# Patient Record
Sex: Female | Born: 1995
Health system: Southern US, Community
[De-identification: ages and names within clinical notes are randomized; demographics above are authoritative.]

## PROBLEM LIST (undated history)

## (undated) DIAGNOSIS — D690 Allergic purpura: Secondary | ICD-10-CM

## (undated) DIAGNOSIS — G43909 Migraine, unspecified, not intractable, without status migrainosus: Secondary | ICD-10-CM

## (undated) DIAGNOSIS — K5900 Constipation, unspecified: Secondary | ICD-10-CM

## (undated) DIAGNOSIS — E079 Disorder of thyroid, unspecified: Secondary | ICD-10-CM

## (undated) DIAGNOSIS — I10 Essential (primary) hypertension: Secondary | ICD-10-CM

## (undated) DIAGNOSIS — O24419 Gestational diabetes mellitus in pregnancy, unspecified control: Secondary | ICD-10-CM

## (undated) HISTORY — DX: Essential (primary) hypertension: I10

## (undated) HISTORY — DX: Gestational diabetes mellitus in pregnancy, unspecified control: O24.419

## (undated) HISTORY — PX: ESOPHAGOGASTRODUODENOSCOPY: SHX1529

## (undated) HISTORY — PX: WISDOM TOOTH EXTRACTION: SHX21

---
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
## 2016-02-16 ENCOUNTER — Emergency Department (HOSPITAL_COMMUNITY): Payer: Medicaid Other

## 2016-02-16 ENCOUNTER — Emergency Department (HOSPITAL_COMMUNITY)
Admission: EM | Admit: 2016-02-16 | Discharge: 2016-02-16 | Disposition: A | Discharge status: Home / Self Care | Payer: Medicaid Other | Attending: Emergency Medicine | Admitting: Emergency Medicine

## 2016-02-16 ENCOUNTER — Encounter (HOSPITAL_COMMUNITY): Payer: Self-pay | Admitting: Family Medicine

## 2016-02-16 DIAGNOSIS — Z8639 Personal history of other endocrine, nutritional and metabolic disease: Secondary | ICD-10-CM | POA: Insufficient documentation

## 2016-02-16 DIAGNOSIS — Z880 Allergy status to penicillin: Secondary | ICD-10-CM | POA: Diagnosis not present

## 2016-02-16 DIAGNOSIS — Z8719 Personal history of other diseases of the digestive system: Secondary | ICD-10-CM | POA: Diagnosis not present

## 2016-02-16 DIAGNOSIS — Z8679 Personal history of other diseases of the circulatory system: Secondary | ICD-10-CM | POA: Insufficient documentation

## 2016-02-16 DIAGNOSIS — J40 Bronchitis, not specified as acute or chronic: Secondary | ICD-10-CM | POA: Insufficient documentation

## 2016-02-16 DIAGNOSIS — B349 Viral infection, unspecified: Secondary | ICD-10-CM | POA: Diagnosis not present

## 2016-02-16 DIAGNOSIS — Z3202 Encounter for pregnancy test, result negative: Secondary | ICD-10-CM | POA: Diagnosis not present

## 2016-02-16 DIAGNOSIS — R109 Unspecified abdominal pain: Secondary | ICD-10-CM | POA: Diagnosis not present

## 2016-02-16 DIAGNOSIS — Z791 Long term (current) use of non-steroidal anti-inflammatories (NSAID): Secondary | ICD-10-CM | POA: Diagnosis not present

## 2016-02-16 DIAGNOSIS — R51 Headache: Secondary | ICD-10-CM | POA: Diagnosis present

## 2016-02-16 LAB — COMPREHENSIVE METABOLIC PANEL
ALK PHOS: 75 U/L (ref 38–126)
ALT: 44 U/L (ref 14–54)
AST: 34 U/L (ref 15–41)
Albumin: 3.7 g/dL (ref 3.5–5.0)
Anion gap: 9 (ref 5–15)
BILIRUBIN TOTAL: 0.4 mg/dL (ref 0.3–1.2)
BUN: 7 mg/dL (ref 6–20)
CALCIUM: 9.6 mg/dL (ref 8.9–10.3)
CHLORIDE: 107 mmol/L (ref 101–111)
CO2: 24 mmol/L (ref 22–32)
CREATININE: 0.58 mg/dL (ref 0.44–1.00)
Glucose, Bld: 94 mg/dL (ref 65–99)
Potassium: 4 mmol/L (ref 3.5–5.1)
Sodium: 140 mmol/L (ref 135–145)
Total Protein: 7.2 g/dL (ref 6.5–8.1)

## 2016-02-16 LAB — I-STAT BETA HCG BLOOD, ED (MC, WL, AP ONLY)

## 2016-02-16 LAB — CBC
HCT: 36.1 % (ref 36.0–46.0)
Hemoglobin: 12.7 g/dL (ref 12.0–15.0)
MCH: 31.8 pg (ref 26.0–34.0)
MCHC: 35.2 g/dL (ref 30.0–36.0)
MCV: 90.3 fL (ref 78.0–100.0)
PLATELETS: 298 10*3/uL (ref 150–400)
RBC: 4 MIL/uL (ref 3.87–5.11)
RDW: 12.3 % (ref 11.5–15.5)
WBC: 6.9 10*3/uL (ref 4.0–10.5)

## 2016-02-16 LAB — URINALYSIS, ROUTINE W REFLEX MICROSCOPIC
Bilirubin Urine: NEGATIVE
GLUCOSE, UA: NEGATIVE mg/dL
HGB URINE DIPSTICK: NEGATIVE
KETONES UR: NEGATIVE mg/dL
LEUKOCYTES UA: NEGATIVE
Nitrite: NEGATIVE
PROTEIN: NEGATIVE mg/dL
Specific Gravity, Urine: 1.022 (ref 1.005–1.030)
pH: 7 (ref 5.0–8.0)

## 2016-02-16 LAB — LIPASE, BLOOD: LIPASE: 30 U/L (ref 11–51)

## 2016-02-16 MED ORDER — PREDNISONE 20 MG PO TABS
60.0000 mg | ORAL_TABLET | Freq: Once | ORAL | Status: AC
  Administered 2016-02-16: 60 mg via ORAL
  Filled 2016-02-16: qty 3

## 2016-02-16 MED ORDER — IPRATROPIUM BROMIDE 0.02 % IN SOLN
0.5000 mg | Freq: Once | RESPIRATORY_TRACT | Status: AC
  Administered 2016-02-16: 0.5 mg via RESPIRATORY_TRACT
  Filled 2016-02-16 (×2): qty 2.5

## 2016-02-16 MED ORDER — ALBUTEROL SULFATE HFA 108 (90 BASE) MCG/ACT IN AERS
1.0000 | INHALATION_SPRAY | RESPIRATORY_TRACT | Status: DC | PRN
  Administered 2016-02-16: 2 via RESPIRATORY_TRACT
  Filled 2016-02-16: qty 6.7

## 2016-02-16 MED ORDER — PREDNISONE 20 MG PO TABS: 40.0000 mg | ORAL_TABLET | Freq: Every day | ORAL | Status: DC

## 2016-02-16 MED ORDER — ALBUTEROL SULFATE (2.5 MG/3ML) 0.083% IN NEBU
5.0000 mg | INHALATION_SOLUTION | Freq: Once | RESPIRATORY_TRACT | Status: AC
  Administered 2016-02-16: 5 mg via RESPIRATORY_TRACT
  Filled 2016-02-16: qty 6

## 2016-02-16 NOTE — ED Notes (Signed)
Pt here with multiple complains. sts headache and she thinks her BP is up. sts also abd pain and weakness. sts seeing spots in her eyes. sts she never checked her BP just thought it was up. BP 116/73 in triage. sts nausea in the morning.

## 2016-02-16 NOTE — ED Provider Notes (Signed)
CSN: [id number]     Arrival date & time 02/16/16  0844 History   First MD Initiated Contact with Patient 02/16/16 [phone]     Chief Complaint  Patient presents with  . Abdominal Pain  . Hypertension  . Headache     (Consider location/radiation/quality/duration/timing/severity/associated sxs/prior Treatment) HPI Comments: Pt is an 20 y/o female with hx of thyroid disease and constipation presenting with 2 week hx of just not feeling well.  She states that she has morning and evening nausea without vomiting.  Her migraine headaches have been worse than normal and she occasionally will see spots in her urine.  Her abdomen has felt uneasy but denies any true pain.  No diarrhea and BM's are always irregular and she denies any changes.  No vaginal bleeding or d/c.  She can't remember when her last menses was.  Also over the last month she has had a persistent non-productive cough.  She denies fever or SOB.  She will still having wheezing at night and morning.  She is using her inhaler but was given allergy meds and prednisone at the beginning of the month which did help.  No prior hx of asthma and pt denies tobacco use.  Patient is a 20 y.o. female presenting with abdominal pain, hypertension, and headaches. The history is provided by the patient.  Abdominal Pain Hypertension Associated symptoms include abdominal pain and headaches.  Headache Associated symptoms: abdominal pain     Past Medical History  Diagnosis Date  . Constipation   . Thyroid disease   . Migraine    History reviewed. No pertinent past surgical history. History reviewed. No pertinent family history. Social History  Substance Use Topics  . Smoking status: Never Smoker   . Smokeless tobacco: Never Used  . Alcohol Use: No   OB History    No data available     Review of Systems  Gastrointestinal: Positive for abdominal pain.  Neurological: Positive for headaches.  All other systems reviewed and are  negative.     Allergies  Peanut-containing drug products and Penicillins  Home Medications   Prior to Admission medications   Medication Sig Start Date End Date Taking? Authorizing Provider  acetaminophen (TYLENOL) 325 MG tablet Take 650 mg by mouth every 6 (six) hours as needed for mild pain.    Historical Provider, MD  diphenhydrAMINE (SOMINEX) 25 MG tablet Take 25 mg by mouth at bedtime as needed for sleep.    Historical Provider, MD  levothyroxine (SYNTHROID, LEVOTHROID) 25 MCG tablet Take 1 tablet (25 mcg total) by mouth daily before breakfast. Patient not taking: Reported on 09/14/2015 04/24/15   Saverio DankerSarah E Stephens, MD  naproxen (NAPROSYN) 500 MG tablet Take 1 tablet (500 mg total) by mouth 2 (two) times daily. 11/09/15   Tatyana Kirichenko, PA-C  ondansetron (ZOFRAN ODT) 4 MG disintegrating tablet Take 1 tablet (4 mg total) by mouth every 8 (eight) hours as needed for nausea or vomiting. Patient not taking: Reported on 11/01/2015 09/14/15   Deirdre PeerJeremiah Gaddy, MD  polyethylene glycol (GOLYTELY) 236 G solution Take 4,000 mLs by mouth once. 11/01/15   Tatyana Kirichenko, PA-C  polyethylene glycol powder (GLYCOLAX/MIRALAX) powder Take 17 g by mouth 2 (two) times daily. Patient not taking: Reported on 11/01/2015 04/24/15   Saverio DankerSarah E Stephens, MD  tiZANidine (ZANAFLEX) 4 MG capsule Take 1 capsule (4 mg total) by mouth 3 (three) times daily as needed (headache). Patient not taking: Reported on 11/01/2015 08/17/15   Francee PiccoloJennifer Piepenbrink, PA-C  BP 109/52 mmHg  Pulse 75  Temp(Src) 98.3 F (36.8 C) (Oral)  Resp 18  SpO2 99%  LMP 01/05/2016 Physical Exam  Constitutional: She is oriented to person, place, and time. She appears well-developed and well-nourished. No distress.  HENT:  Head: Normocephalic and atraumatic.  Mouth/Throat: Oropharynx is clear and moist.  Eyes: Conjunctivae and EOM are normal. Pupils are equal, round, and reactive to light.  Neck: Normal range of motion. Neck supple.   Cardiovascular: Normal rate, regular rhythm and intact distal pulses.   No murmur heard. Pulmonary/Chest: Effort normal. No respiratory distress. She has wheezes. She has no rales.  Scant wheezes  Abdominal: Soft. She exhibits no distension. There is no tenderness. There is no rebound and no guarding.  Musculoskeletal: Normal range of motion. She exhibits no edema or tenderness.  Neurological: She is alert and oriented to person, place, and time.  Skin: Skin is warm and dry. No rash noted. No erythema.  Psychiatric: She has a normal mood and affect. Her behavior is normal.  Nursing note and vitals reviewed.   ED Course  Procedures (including critical care time) Labs Review Labs Reviewed  LIPASE, BLOOD  COMPREHENSIVE METABOLIC PANEL  CBC  URINALYSIS, ROUTINE W REFLEX MICROSCOPIC (NOT AT [facility])  I-STAT BETA HCG BLOOD, ED ([facility], [facility], [facility] ONLY)    Imaging Review Dg Chest 2 View  02/16/2016  CLINICAL DATA:  20 year old female with a history of shortness of breath episodic for 4 weeks. EXAM: CHEST - 2 VIEW COMPARISON:  None. FINDINGS: Cardiothymic silhouette within normal limits in size and contour. Lung volumes adequate. No confluent airspace disease pleural effusion, or pneumothorax. Mild central airway thickening. No displaced fracture. Unremarkable appearance of the upper abdomen. IMPRESSION: Nonspecific central airway thickening may reflect reactive airway disease or potentially viral infection. No confluent airspace disease to suggest pneumonia. Signed, Yvone Neu. Loreta Ave, DO [facility] Radiology Electronically Signed   By: Gilmer Mor D.O.   On: 02/16/2016 11:26   I have personally reviewed and evaluated these images and lab results as part of my medical decision-making.   EKG Interpretation None      MDM   Final diagnoses:  Bronchitis  Viral syndrome    Pt presenting with c/o of malaise over the last 2 weeks with morning and  evening nausea without vomiting.  She states she occasionally will get spots in her vision but has noted her migraines are worse that normal.  Pt thought maybe her BP was up but never checked it.  Pt also about 1 month ago dx with bronchitis and has had persistent wheezing and cough.  She did take prednisone and allergy meds but those have run out and cough has persisted.  Denies any vaginal or urinary complaints but pt is unsure when her last menses was.  Concern for pregnancy as the source of pt's sx vs chronic bronchitis.  Mild wheezing on exam today but VS wnl and no distress.  Pt does not appear to be dehydrated.  No localized abd pain.  No focal neurologic findings present.  CBC, CMP, lipase, UA, HCG pending.  Pt given albuterol/atrovent.  Will get CXR.  11:42 AM Labs are all wnl.  CXR with peribronchial thickening consistent with viral etiology.  Pt treated for bronchitis with steroids and she was given an inhaler.  No abx at this time.  Gwyneth Sprout, MD 02/16/16 1143

## 2016-03-02 ENCOUNTER — Encounter: Payer: Self-pay | Admitting: Neurology

## 2016-03-02 ENCOUNTER — Ambulatory Visit (INDEPENDENT_AMBULATORY_CARE_PROVIDER_SITE_OTHER): Payer: Medicaid Other | Admitting: Neurology

## 2016-03-02 VITALS — BP 106/71 | HR 81 | Resp 18 | Ht 63.0 in | Wt 207.0 lb

## 2016-03-02 DIAGNOSIS — R351 Nocturia: Secondary | ICD-10-CM | POA: Diagnosis not present

## 2016-03-02 DIAGNOSIS — E669 Obesity, unspecified: Secondary | ICD-10-CM

## 2016-03-02 DIAGNOSIS — R0683 Snoring: Secondary | ICD-10-CM | POA: Diagnosis not present

## 2016-03-02 DIAGNOSIS — R0681 Apnea, not elsewhere classified: Secondary | ICD-10-CM

## 2016-03-02 DIAGNOSIS — R519 Headache, unspecified: Secondary | ICD-10-CM

## 2016-03-02 DIAGNOSIS — Z789 Other specified health status: Secondary | ICD-10-CM

## 2016-03-02 DIAGNOSIS — R51 Headache: Secondary | ICD-10-CM

## 2016-03-02 DIAGNOSIS — G4719 Other hypersomnia: Secondary | ICD-10-CM | POA: Diagnosis not present

## 2016-03-02 DIAGNOSIS — Z9189 Other specified personal risk factors, not elsewhere classified: Secondary | ICD-10-CM

## 2016-03-02 DIAGNOSIS — G43011 Migraine without aura, intractable, with status migrainosus: Secondary | ICD-10-CM

## 2016-03-02 MED ORDER — NORTRIPTYLINE HCL 10 MG PO CAPS: ORAL_CAPSULE | ORAL | Status: DC

## 2016-03-02 NOTE — Progress Notes (Signed)
Subjective:    Patient ID: Nichole Bush is a 20 y.o. female.  HPI     Huston FoleySaima Kwame Ryland, MD, PhD [facility] [address], [address] [address] [place], [place] [address]  Dear Dr. Rolena InfanteStansfield,   I saw your patient, Nichole Bush, upon your kind request in my neurologic clinic today for initial consultation of her recurrent headaches, concern for migraines. The patient is unaccompanied today. As you know, Nichole Bush is an 20 year old right-handed woman with an underlying medical history of asthma, allergies, and obesity, who reports a longer standing history of migraine headaches. She started having headaches when she was 20 years old. She was getting a headache about 3 times a week but in the last 4 weeks she has had a daily headache. She reports a left-sided pounding headache typically, associated with nausea, occasional vomiting, and photophobia, typically no aura described but occasional blurry vision reported. She has a family history of migraines in her father. Her parents are separated. She lived with [place] time in [place]. He still lives there, mother lives in [place]. She has 3 siblings on father's side, 4 siblings on mother's side. She has gained weight. She snores, at times loudly. Boyfriend has noted witnessed apneic pauses while she is asleep. Sleep is disrupted, she has nocturia about twice a night. She often wakes up with a headache. She drinks a lot of caffeine in the form of soda, usually at 2 L bottle of Pepsi per day and iced coffee, one large per day, she does not remember the last time she drank water. She lives alone. She has been living alone for the past 3 months. She works at [employer] so. She is a nonsmoker denies using any illicit drugs. She does not drink alcohol. Bedtime is around 11 PM, wakeup time is around 6 AM as she takes her boyfriend to work. She denies any one-sided weakness or numbness. She has a large hairdo, but says, that she  only recently had her hairstyle changed and it did not affect her headaches. I reviewed your office note from 02/25/2016, which you kindly included. She was given a prescription for Phenergan for nausea, and Imitrex 25 mg strength 1-2 pills as needed for migraine. She has tried the medications but feels she is too sleepy from them. She has tried over-the-counter medications including Excedrin Migraine and Tylenol PM, with no significant improvement.  She does not report being on prescription medication as a child for migraines. There is no family history of long QT, heart disease, and she has no personal or family history of lupus, or sickle cell trait or sickle cell disease.  Her Past Medical History Is Significant For: Past Medical History  Diagnosis Date  . Constipation   . Thyroid disease   . Migraine   . Hypertension     Her Past Surgical History Is Significant For: No past surgical history on file.  Her Family History Is Significant For: Family History  Problem Relation Age of Onset  . Migraines Father   . Hypertension Father     Her Social History Is Significant For: Social History   Social History  . Marital Status: Single    Spouse Name: N/A  . Number of Children: N/A  . Years of Education: HS   Occupational History  . [employer]    Social History Main Topics  . Smoking status: Never Smoker   . Smokeless tobacco: Never Used  . Alcohol Use: No  .  Drug Use: No  . Sexual Activity: Not Asked   Other Topics Concern  . None   Social History Narrative   States that she drinks "a lot" of soda daily     Her Allergies Are:  Allergies  Allergen Reactions  . Peanut-Containing Drug Products Anaphylaxis  . Penicillins Anaphylaxis, Hives and Other (See Comments)    Has patient had a PCN reaction causing immediate rash, facial/tongue/throat swelling, SOB or lightheadedness with hypotension: Yes Has patient had a PCN reaction causing severe rash involving mucus membranes  or skin necrosis: No Has patient had a PCN reaction that required hospitalization No Has patient had a PCN reaction occurring within the last 10 years: No If all of the above answers are "NO", then may proceed with Cephalosporin use.   :   Her Current Medications Are:  Outpatient Encounter Prescriptions as of 03/02/2016  Medication Sig  . albuterol (PROVENTIL HFA;VENTOLIN HFA) 108 (90 Base) MCG/ACT inhaler Inhale 2 puffs into the lungs every 6 (six) hours as needed for wheezing or shortness of breath.  . diphenhydramine-acetaminophen (TYLENOL PM) 25-500 MG TABS tablet Take 1 tablet by mouth at bedtime.  . ondansetron (ZOFRAN ODT) 4 MG disintegrating tablet Take 1 tablet (4 mg total) by mouth every 8 (eight) hours as needed for nausea or vomiting.  . polyethylene glycol powder (GLYCOLAX/MIRALAX) powder Take 17 g by mouth 2 (two) times daily. (Patient taking differently: Take 17 g by mouth daily as needed for mild constipation. )  . predniSONE (DELTASONE) 20 MG tablet Take 2 tablets (40 mg total) by mouth daily.  . promethazine (PHENERGAN) 25 MG tablet TK 1 T PO Q 6 H  . SUMAtriptan (IMITREX) 25 MG tablet    No facility-administered encounter medications on file as of 03/02/2016.  :   Review of Systems:  Out of a complete 14 point review of systems, all are reviewed and negative with the exception of these symptoms as listed below:   Review of Systems  Neurological:       Patient reports that she has had migraines for a couple of years. Recently have become worse. Patient states that she has had current migraines for 4 weeks.  Patient reports that she lived in a house for a year that had mold and mildew, moved out Feb 2017    Objective:  Neurologic Exam  Physical Exam Physical Examination:   Filed Vitals:   03/02/16 1104  BP: 106/71  Pulse: 81  Resp: 18    General Examination: The patient is a very pleasant 20 y.o. female in no acute distress. She appears well-developed and  well-nourished and adequately groomed.   HEENT: Normocephalic, atraumatic, pupils are equal, round and reactive to light and accommodation. Funduscopic exam is normal with sharp disc margins noted. Extraocular tracking is good without limitation to gaze excursion or nystagmus noted. Normal smooth pursuit is noted. Hearing is grossly intact. Tympanic membranes are clear bilaterally. Face is symmetric with normal facial animation and normal facial sensation. Speech is clear with no dysarthria noted. There is no hypophonia. There is no lip, neck/head, jaw or voice tremor. Neck is supple with full range of passive and active motion. There are no carotid bruits on auscultation. Oropharynx exam reveals: mild mouth dryness, adequate dental hygiene and moderate airway crowding, due to smaller airway entry, tonsils of 2-3+ bilaterally and thicker soft palate. Mallampati is class II. Tongue protrudes centrally and palate elevates symmetrically. Neck size is 15 1/8 inches. She has a Mild overbite.  Chest: Clear to auscultation without wheezing, rhonchi or crackles noted.  Heart: S1+S2+0, regular and normal without murmurs, rubs or gallops noted.   Abdomen: Soft, non-tender and non-distended with normal bowel sounds appreciated on auscultation.  Extremities: There is no pitting edema in the distal lower extremities bilaterally. Pedal pulses are intact.  Skin: Warm and dry without trophic changes noted. There are no varicose veins.  Musculoskeletal: exam reveals no obvious joint deformities, tenderness or joint swelling or erythema.   Neurologically:  Mental status: The patient is awake, alert and oriented in all 4 spheres. Her immediate and remote memory, attention, language skills and fund of knowledge are appropriate. There is no evidence of aphasia, agnosia, apraxia or anomia. Speech is clear with normal prosody and enunciation. Thought process is linear. Mood is normal and affect is normal.  Cranial  nerves II - XII are as described above under HEENT exam. In addition: shoulder shrug is normal with equal shoulder height noted. Motor exam: Normal bulk, strength and tone is noted. There is no drift, tremor or rebound. Romberg is negative. Reflexes are 2+ throughout. Babinski: Toes are flexor bilaterally. Fine motor skills and coordination: intact with normal finger taps, normal hand movements, normal rapid alternating patting, normal foot taps and normal foot agility.  Cerebellar testing: No dysmetria or intention tremor on finger to nose testing. Heel to shin is unremarkable bilaterally. There is no truncal or gait ataxia.  Sensory exam: intact to light touch, pinprick, vibration, temperature sense in the upper and lower extremities.  Gait, station and balance: She stands easily. No veering to one side is noted. No leaning to one side is noted. Posture is age-appropriate and stance is narrow based. Gait shows normal stride length and normal pace. No problems turning are noted. Tandem walk is unremarkable.   Assessment and Plan:   In summary, Jenel Gierke is a very pleasant 20 y.o.-year old female with an underlying medical history of asthma, allergies, and obesity, who presents with a history of migrainous headaches for at least 6 or 7 years duration, worse in the past 4 weeks with nearly daily headaches. Her history and physical exam are in keeping with migraine without aura, intractable with status at this time. She has in addition history and physical exam concerning for underlying obstructive sleep apnea. She is overusing caffeine which can also be a contributor, in addition to sleep deprivation and sleep disordered breathing which can all be contributors as well.  I had a long chat with the patient about my findings and the diagnosis of migraine headaches, obstructive sleep apnea, the prognosis and treatment options. We talked about medical treatments and non-pharmacological approaches. We talked  about maintaining a healthy lifestyle in general. I encouraged the patient to eat healthy, exercise daily and keep well hydrated, to keep a scheduled bedtime and wake time routine, to not skip any meals and eat healthy snacks in between meals and to have protein with every meal.  She is strongly advised to gradually reduce her caffeine intake and limit herself to 1-2 servings per day, avoid after 5 PM. She is strongly advised to increase her water intake and stay better hydrated with water. We talked about sleep hygiene. I advised the patient about common headache triggers: sleep deprivation, dehydration, overheating, stress, hypoglycemia or skipping meals and blood sugar fluctuations, excessive pain medications or excessive alcohol use or caffeine withdrawal. Some people have food triggers such as aged cheese, orange juice or chocolate, especially dark chocolate, or MSG (monosodium glutamate).  She is to try to avoid these headache triggers as much possible. It may be helpful to keep a headache diary to figure out what makes Her headaches worse or brings them on and what alleviates them. Some people report headache onset after exercise but studies have shown that regular exercise may actually prevent headaches from coming. If She has exercise-induced headaches, She is advised to drink plenty of fluid before and after exercising and that to not overdo it and to not overheat.  She has a nonfocal neurological exam, she is reassured in that regard. I talked to her about sleep study testing and she is willing to return for a sleep study and consider treatment for obstructive sleep apnea the need arises. I talked to her about sleep study testing as well. I do not think we need any imaging studies from my end of things at this time. She can continue abortive treatment for migraines with as needed Imitrex generic. She can use Phenergan for nausea but is advised that it can be sedating. For headache prevention I suggested  a trial of nortriptyline, starting with 10 mg each night for a week then increase to 20 mg each night thereafter. I talked to her about this medication, and side effects and the titration. She was given written instructions.   I will see her back routinely in 3 months, sooner if needed. We will be in touch regarding her sleep study results over the phone as well. I answered all her questions today and the patient was in agreement with the plan.   Thank you very much for allowing me to participate in the care of this nice patient. If I can be of any further assistance to you please do not hesitate to call me at [phone].  Sincerely,   Huston Foley, MD, PhD

## 2016-03-02 NOTE — Patient Instructions (Addendum)
Please remember, common headache triggers are: sleep deprivation, dehydration, overheating, stress, hypoglycemia or skipping meals and blood sugar fluctuations, excessive pain medications or excessive alcohol use or caffeine withdrawal. Some people have food triggers such as aged cheese, orange juice or chocolate, especially dark chocolate, or MSG (monosodium glutamate). Try to avoid these headache triggers as much possible. It may be helpful to keep a headache diary to figure out what makes your headaches worse or brings them on and what alleviates them. Some people report headache onset after exercise but studies have shown that regular exercise may actually prevent headaches from coming. If you have exercise-induced headaches, please make sure that you drink plenty of fluid before and after exercising and that you do not over do it and do not overheat.  Based on your symptoms and your exam I believe you are at risk for obstructive sleep apnea or OSA, and I think we should proceed with a sleep study to determine whether you do or do not have OSA and how severe it is. If you have more than mild OSA, I want you to consider treatment with CPAP. Please remember, the risks and ramifications of moderate to severe obstructive sleep apnea or OSA are: Cardiovascular disease, including congestive heart failure, stroke, difficult to control hypertension, arrhythmias, and even type 2 diabetes has been linked to untreated OSA. Sleep apnea causes disruption of sleep and sleep deprivation in most cases, which, in turn, can cause recurrent headaches, problems with memory, mood, concentration, focus, and vigilance. Most people with untreated sleep apnea report excessive daytime sleepiness, which can affect their ability to drive. Please do not drive if you feel sleepy.   I will likely see you back after your sleep study to go over the test results and where to go from there. We will call you after your sleep study to advise  about the results (most likely, you will hear from Nichole Bush, my nurse) and to set up an appointment at the time, as necessary.    Our sleep lab administrative assistant, Alvis LemmingsDawn will meet with you or call you to schedule your sleep study. If you don't hear back from her by next week please feel free to call her at [phone]. This is her direct line and please leave a message with your phone number to call back if you get the voicemail box. She will call back as soon as possible.   You have to drink more water! Reduce your caffeine intake gradually and try to limit caffeine to 1 to 2 servings per day, avoid caffeine after 5 PM. Try to work on weight loss.   For headache prevention, we will start Pamelor (generic name: nortriptyline), 10 mg: Take 1 pill daily at bedtime for one week, then 2 pills daily at bedtime thereafter. Common side effects reported are: mouth dryness, drowsiness, confusion, dizziness.   You can use phenergan for nausea as needed and Imitrex for migraine acute treatment as needed.

## 2016-03-06 ENCOUNTER — Ambulatory Visit: Payer: Self-pay | Admitting: Neurology

## 2016-03-13 ENCOUNTER — Emergency Department (HOSPITAL_COMMUNITY)
Admission: EM | Admit: 2016-03-13 | Discharge: 2016-03-13 | Disposition: A | Discharge status: Home / Self Care | Payer: Medicaid Other | Attending: Emergency Medicine | Admitting: Emergency Medicine

## 2016-03-13 ENCOUNTER — Encounter (HOSPITAL_COMMUNITY): Payer: Self-pay | Admitting: *Deleted

## 2016-03-13 DIAGNOSIS — R0981 Nasal congestion: Secondary | ICD-10-CM | POA: Diagnosis not present

## 2016-03-13 DIAGNOSIS — I10 Essential (primary) hypertension: Secondary | ICD-10-CM | POA: Insufficient documentation

## 2016-03-13 DIAGNOSIS — R05 Cough: Secondary | ICD-10-CM | POA: Insufficient documentation

## 2016-03-13 DIAGNOSIS — R062 Wheezing: Secondary | ICD-10-CM | POA: Insufficient documentation

## 2016-03-13 MED ORDER — ALBUTEROL SULFATE (2.5 MG/3ML) 0.083% IN NEBU
INHALATION_SOLUTION | RESPIRATORY_TRACT | Status: AC
  Filled 2016-03-13: qty 6

## 2016-03-13 MED ORDER — ALBUTEROL SULFATE (2.5 MG/3ML) 0.083% IN NEBU
5.0000 mg | INHALATION_SOLUTION | Freq: Once | RESPIRATORY_TRACT | Status: AC
  Administered 2016-03-13: 5 mg via RESPIRATORY_TRACT

## 2016-03-13 NOTE — ED Notes (Signed)
Pt not wanting to stay. Will follow up with clinic tomorrow

## 2016-03-13 NOTE — ED Notes (Signed)
Pt refused chest XR.

## 2016-03-13 NOTE — ED Notes (Signed)
Pt in c/o cough and congestion for the last few days, increased wheezing, dx with bronchitis a few months ago and has had ongoing symptoms since, symptoms continue to return after medications are completed

## 2016-03-26 ENCOUNTER — Ambulatory Visit (INDEPENDENT_AMBULATORY_CARE_PROVIDER_SITE_OTHER): Payer: Medicaid Other | Admitting: Neurology

## 2016-03-26 DIAGNOSIS — G4761 Periodic limb movement disorder: Secondary | ICD-10-CM

## 2016-03-26 DIAGNOSIS — G4733 Obstructive sleep apnea (adult) (pediatric): Secondary | ICD-10-CM | POA: Diagnosis not present

## 2016-03-26 DIAGNOSIS — G4720 Circadian rhythm sleep disorder, unspecified type: Secondary | ICD-10-CM

## 2016-03-26 NOTE — Progress Notes (Deleted)
Subjective:    Patient ID: Nichole Bush is a 20 y.o. female.  HPI {Common ambulatory SmartLinks:[id number]}  Review of Systems  Objective:  Neurologic Exam  Physical Exam  Assessment:   ***  Plan:   ***

## 2016-03-30 ENCOUNTER — Telehealth: Payer: Self-pay | Admitting: Neurology

## 2016-03-30 NOTE — Telephone Encounter (Signed)
I spoke to patient and she is aware of results. I made an appt for her on 5/25.

## 2016-03-30 NOTE — Telephone Encounter (Signed)
Patient referred by Dr. Mikal PlaneStransfield, seen by me on 03/02/16, diagnostic PSG on 03/26/16.   Please call and notify the patient that the recent sleep study did not show any significant obstructive sleep apnea. Please inform patient that I would like to go over the details of the study during her follow up appointment. Please remind her to keep her appointment for July. Also, route or fax report to PCP and referring MD, if other than PCP.  Once you have spoken to patient, you can close this encounter.   Thanks,  Huston FoleySaima Baley Shands, MD, PhD [facility])

## 2016-04-11 ENCOUNTER — Encounter: Payer: Self-pay | Admitting: Neurology

## 2016-04-18 DIAGNOSIS — J45909 Unspecified asthma, uncomplicated: Secondary | ICD-10-CM | POA: Insufficient documentation

## 2016-04-20 ENCOUNTER — Telehealth: Payer: Self-pay | Admitting: *Deleted

## 2016-04-20 ENCOUNTER — Encounter: Payer: Self-pay | Admitting: Neurology

## 2016-04-20 ENCOUNTER — Ambulatory Visit (INDEPENDENT_AMBULATORY_CARE_PROVIDER_SITE_OTHER): Payer: Medicaid Other | Admitting: Neurology

## 2016-04-20 VITALS — BP 104/78 | HR 80 | Resp 16 | Ht 63.0 in | Wt 204.0 lb

## 2016-04-20 DIAGNOSIS — G43011 Migraine without aura, intractable, with status migrainosus: Secondary | ICD-10-CM

## 2016-04-20 DIAGNOSIS — R51 Headache: Secondary | ICD-10-CM | POA: Diagnosis not present

## 2016-04-20 DIAGNOSIS — E669 Obesity, unspecified: Secondary | ICD-10-CM | POA: Diagnosis not present

## 2016-04-20 DIAGNOSIS — R519 Headache, unspecified: Secondary | ICD-10-CM

## 2016-04-20 MED ORDER — NORTRIPTYLINE HCL 10 MG PO CAPS: 30.0000 mg | ORAL_CAPSULE | Freq: Every day | ORAL | Status: DC

## 2016-04-20 NOTE — Telephone Encounter (Signed)
Message For: OFC                  Taken 18-MAY-17 at  8:05AM by TRL ------------------------------------------------------------ Nichole Bush, Nichole Bush             CID [id number]  Patient SAME                 Pt's Dr NOT SURE     Area Code 336 Phone# [phone] * DOB [birth year]    RE NEEDS TO VERIFY APPT TIME TODAY 9:30?                                                                  Disp:Y/N N If Y = C/B If No Response In 20minutes ============================================================

## 2016-04-20 NOTE — Patient Instructions (Signed)
Please remember, common headache triggers are: sleep deprivation, dehydration, overheating, stress, hypoglycemia or skipping meals and blood sugar fluctuations, excessive pain medications or excessive alcohol use or caffeine withdrawal. Some people have food triggers such as aged cheese, orange juice or chocolate, especially dark chocolate, or MSG (monosodium glutamate). Try to avoid these headache triggers as much possible. It may be helpful to keep a headache diary to figure out what makes your headaches worse or brings them on and what alleviates them. Some people report headache onset after exercise but studies have shown that regular exercise may actually prevent headaches from coming. If you have exercise-induced headaches, please make sure that you drink plenty of fluid before and after exercising and that you do not over do it and do not overheat.  We will increase your Pamelor to 30 mg each night and bring you back for a follow up soon, 2 months with one of our nurse practitioners. I will see you back after that. We may consider another medication, such as topiramate. Please ask your parents what all you have tried in the past for migraines.

## 2016-04-20 NOTE — Progress Notes (Signed)
Subjective:    Patient ID: Nichole Bush is a 20 y.o. female.  HPI     Interim history:   Nichole Bush is an 20 year old right-handed woman with an underlying medical history of asthma, allergies, and obesity, who presents for follow-up consultation of her recurrent migraines. The patient is unaccompanied today. I first met her on 03/02/2016 at the request of her primary care physician, at which time she reported a long-standing history of migraine headaches since childhood. I suggested we proceed with a sleep study as she also reported some symptoms concerning for underlying obstructive sleep apnea. For preventative medication for migraines I suggested a trial of nortriptyline, starting at 10 mg with gradual titration. She had a baseline sleep study on 03/26/2016. I went over her test results with her in detail today. Sleep efficiency was 91.3% with a normal sleep latency of 22 minutes and wake after sleep onset of 20 minutes with mild sleep fragmentation noted. She had a normal arousal index. She had an increased percentage of stage II sleep, a normal percentage of slow-wave sleep and a decreased percentage of REM sleep at 11.9% with a prolonged REM latency of 232 minutes. She had mild PLMS 9.7 per hour resulting in 4.1 arousals per hour. She had mild to moderate snoring. Total AHI was 2.3 per hour. Average oxygen saturation was 97%, nadir was 89%.  Today, 04/20/2016: She reports doing a little better, had an eye exam in [facility], has an updated prescription for her glasses. She is taking 20 mg of pamelor at night and seems to tolerate it. She feels that her headache intensity is a little better but frequency seems to be about the same. She often wakes up with a headache. She does not have very much nausea with it. She does get sensitive to light. In the interim, on 03/13/2016 she presented to the emergency room with cough and congestion. She did not stay. She did not get a chest  x-ray.  Previously:  03/02/2016: She reports a longer standing history of migraine headaches. She started having headaches when she was 20 years old. She was getting a headache about 3 times a week but in the last 4 weeks she has had a daily headache. She reports a left-sided pounding headache typically, associated with nausea, occasional vomiting, and photophobia, typically no aura described but occasional blurry vision reported. She has a family history of migraines in her father. Her parents are separated. She lived with her father for some time in [place]. He still lives there, mother lives in [place]. She has 3 siblings on father's side, 4 siblings on mother's side. She has gained weight. She snores, at times loudly. Boyfriend has noted witnessed apneic pauses while she is asleep. Sleep is disrupted, she has nocturia about twice a night. She often wakes up with a headache. She drinks a lot of caffeine in the form of soda, usually at 2 L bottle of Pepsi per day and iced coffee, one large per day, she does not remember the last time she drank water. She lives alone. She has been living alone for the past 3 months. She works at [employer]. She is a nonsmoker denies using any illicit drugs. She does not drink alcohol. Bedtime is around 11 PM, wakeup time is around 6 AM as she takes her boyfriend to work. She denies any one-sided weakness or numbness. She has a large hairdo, but says, that she only recently had her hairstyle changed and it did  not affect her headaches. I reviewed your office note from 02/25/2016, which you kindly included. She was given a prescription for Phenergan for nausea, and Imitrex 25 mg strength 1-2 pills as needed for migraine. She has tried the medications but feels she is too sleepy from them. She has tried over-the-counter medications including Excedrin Migraine and Tylenol PM, with no significant improvement.  She does not report being on prescription medication as a child  for migraines. There is no family history of long QT, heart disease, and she has no personal or family history of lupus, or sickle cell trait or sickle cell disease.  Her Past Medical History Is Significant For: Past Medical History  Diagnosis Date  . Constipation   . Thyroid disease   . Migraine   . Hypertension     Her Past Surgical History Is Significant For: No past surgical history on file.  Her Family History Is Significant For: Family History  Problem Relation Age of Onset  . Migraines Father   . Hypertension Father     Her Social History Is Significant For: Social History   Social History  . Marital Status: Single    Spouse Name: N/A  . Number of Children: N/A  . Years of Education: HS   Occupational History  . [employer]    Social History Main Topics  . Smoking status: Never Smoker   . Smokeless tobacco: Never Used  . Alcohol Use: No  . Drug Use: No  . Sexual Activity: Not Asked   Other Topics Concern  . None   Social History Narrative   States that she drinks "a lot" of soda daily     Her Allergies Are:  Allergies  Allergen Reactions  . Peanut-Containing Drug Products Anaphylaxis  . Penicillins Anaphylaxis, Hives and Other (See Comments)    Has patient had a PCN reaction causing immediate rash, facial/tongue/throat swelling, SOB or lightheadedness with hypotension: Yes Has patient had a PCN reaction causing severe rash involving mucus membranes or skin necrosis: No Has patient had a PCN reaction that required hospitalization No Has patient had a PCN reaction occurring within the last 10 years: No If all of the above answers are "NO", then may proceed with Cephalosporin use.   :   Her Current Medications Are:  Outpatient Encounter Prescriptions as of 04/20/2016  Medication Sig  . albuterol (PROVENTIL HFA;VENTOLIN HFA) 108 (90 Base) MCG/ACT inhaler Inhale 2 puffs into the lungs every 6 (six) hours as needed for wheezing or shortness of breath.  .  diphenhydramine-acetaminophen (TYLENOL PM) 25-500 MG TABS tablet Take 1 tablet by mouth at bedtime.  . nortriptyline (PAMELOR) 10 MG capsule Take 3 capsules (30 mg total) by mouth at bedtime.  . ondansetron (ZOFRAN ODT) 4 MG disintegrating tablet Take 1 tablet (4 mg total) by mouth every 8 (eight) hours as needed for nausea or vomiting.  . polyethylene glycol powder (GLYCOLAX/MIRALAX) powder Take 17 g by mouth 2 (two) times daily. (Patient taking differently: Take 17 g by mouth daily as needed for mild constipation. )  . predniSONE (DELTASONE) 20 MG tablet Take 2 tablets (40 mg total) by mouth daily.  . promethazine (PHENERGAN) 25 MG tablet TK 1 T PO Q 6 H  . SUMAtriptan (IMITREX) 25 MG tablet   . [DISCONTINUED] nortriptyline (PAMELOR) 10 MG capsule 1 pill daily at bedtime for one week, then 2 pills daily at bedtime thereafter   No facility-administered encounter medications on file as of 04/20/2016.  :  Review of  Systems:  Out of a complete 14 point review of systems, all are reviewed and negative with the exception of these symptoms as listed below:  Review of Systems  Neurological:       Patient reports that her headaches are less severe with Nortriptyline but she is still waking up with one every day.  Here to discuss sleep study as well.      Objective:  Neurologic Exam  Physical Exam Physical Examination:   Filed Vitals:   04/20/16 1016  BP: 104/78  Pulse: 80  Resp: 16   General Examination: The patient is a very pleasant 20 y.o. female in no acute distress. She appears well-developed and well-nourished and adequately groomed.   HEENT: Normocephalic, atraumatic, pupils are equal, round and reactive to light and accommodation. Funduscopic exam is normal with sharp disc margins noted. Extraocular tracking is good without limitation to gaze excursion or nystagmus noted. Normal smooth pursuit is noted. Hearing is grossly intact. Face is symmetric with normal facial animation and  normal facial sensation. Speech is clear with no dysarthria noted. There is no hypophonia. There is no lip, neck/head, jaw or voice tremor. Neck is supple with full range of passive and active motion. There are no carotid bruits on auscultation. Oropharynx exam reveals: mild mouth dryness, adequate dental hygiene and moderate airway crowding, due to smaller airway entry, tonsils of 2-3+ bilaterally and thicker soft palate. Mallampati is class II. Tongue protrudes centrally and palate elevates symmetrically.  Chest: Clear to auscultation without wheezing, rhonchi or crackles noted.  Heart: S1+S2+0, regular and normal without murmurs, rubs or gallops noted.   Abdomen: Soft, non-tender and non-distended with normal bowel sounds appreciated on auscultation.  Extremities: There is no pitting edema in the distal lower extremities bilaterally. Pedal pulses are intact.  Skin: Warm and dry without trophic changes noted. There are no varicose veins.  Musculoskeletal: exam reveals no obvious joint deformities, tenderness or joint swelling or erythema.   Neurologically:  Mental status: The patient is awake, alert and oriented in all 4 spheres. Her immediate and remote memory, attention, language skills and fund of knowledge are appropriate. There is no evidence of aphasia, agnosia, apraxia or anomia. Speech is clear with normal prosody and enunciation. Thought process is linear. Mood is normal and affect is normal.  Cranial nerves II - XII are as described above under HEENT exam. In addition: shoulder shrug is normal with equal shoulder height noted. Motor exam: Normal bulk, strength and tone is noted. There is no drift, tremor or rebound. Romberg is negative. Reflexes are 2+ throughout. Babinski: Toes are flexor bilaterally. Fine motor skills and coordination: intact with normal finger taps, normal hand movements, normal rapid alternating patting, normal foot taps and normal foot agility.  Cerebellar testing:  No dysmetria or intention tremor on finger to nose testing. Heel to shin is unremarkable bilaterally. There is no truncal or gait ataxia.  Sensory exam: intact to light touch, pinprick, vibration, temperature sense in the upper and lower extremities.  Gait, station and balance: She stands easily. No veering to one side is noted. No leaning to one side is noted. Posture is age-appropriate and stance is narrow based. Gait shows normal stride length and normal pace. No problems turning are noted. Tandem walk is unremarkable.   Assessment and Plan:   In summary, Koren Plyler is a very pleasant 20 year old female with an underlying medical history of asthma, allergies, and obesity, who presents with a history of migrainous headaches for at  least 6 or 7 years duration, worse in the past 2-3 months with nearly daily headaches. Her history and physical exam are in keeping with migraine without aura, intractable. She had had a recent sleep study which we discussed. She does not have any significant obstructive sleep apnea, mild REM related OSA. For this, she is encouraged to try to lose weight. She has reduced her caffeine intake. She is advised to try to lose weight. She has been able to tolerate nortriptyline 20 mg each night. I would like to increase this to 30 mg each night. Her physical exam and neurological exam are nonfocal. We talked about headache triggers again today. She is advised to stay well-hydrated with water.  We can further increase the nortriptyline as needed. I suggested a two-month check up with one of her nurse practitioners; further increase in nortriptyline to 50 mg can be discussed at the time, alternatively I would suggest we try Topamax next. I will see her back after the nurse practitioner visit. I answered all her questions today and the patient was in agreement with the plan.  I spent 25 minutes in total face-to-face time with the patient, more than 50% of which was spent in counseling  and coordination of care, reviewing test results, reviewing medication and discussing or reviewing the diagnosis of migraines, the prognosis and treatment options.

## 2016-04-23 ENCOUNTER — Encounter (HOSPITAL_COMMUNITY): Payer: Self-pay

## 2016-04-23 ENCOUNTER — Emergency Department (HOSPITAL_COMMUNITY)
Admission: EM | Admit: 2016-04-23 | Discharge: 2016-04-24 | Disposition: A | Discharge status: Home / Self Care | Payer: Medicaid Other | Attending: Emergency Medicine | Admitting: Emergency Medicine

## 2016-04-23 ENCOUNTER — Emergency Department (HOSPITAL_COMMUNITY): Payer: Medicaid Other

## 2016-04-23 DIAGNOSIS — I10 Essential (primary) hypertension: Secondary | ICD-10-CM | POA: Diagnosis not present

## 2016-04-23 DIAGNOSIS — F419 Anxiety disorder, unspecified: Secondary | ICD-10-CM | POA: Insufficient documentation

## 2016-04-23 DIAGNOSIS — R109 Unspecified abdominal pain: Secondary | ICD-10-CM | POA: Insufficient documentation

## 2016-04-23 DIAGNOSIS — R079 Chest pain, unspecified: Secondary | ICD-10-CM | POA: Diagnosis present

## 2016-04-23 LAB — CBC
HCT: 35.8 % — ABNORMAL LOW (ref 36.0–46.0)
Hemoglobin: 12.2 g/dL (ref 12.0–15.0)
MCH: 30 pg (ref 26.0–34.0)
MCHC: 34.1 g/dL (ref 30.0–36.0)
MCV: 88.2 fL (ref 78.0–100.0)
Platelets: 347 10*3/uL (ref 150–400)
RBC: 4.06 MIL/uL (ref 3.87–5.11)
RDW: 12.1 % (ref 11.5–15.5)
WBC: 9.4 10*3/uL (ref 4.0–10.5)

## 2016-04-23 LAB — BASIC METABOLIC PANEL
Anion gap: 7 (ref 5–15)
CALCIUM: 9.5 mg/dL (ref 8.9–10.3)
CO2: 26 mmol/L (ref 22–32)
Chloride: 105 mmol/L (ref 101–111)
Creatinine, Ser: 0.7 mg/dL (ref 0.44–1.00)
GFR calc Af Amer: >60 mL/min (ref >60)
GLUCOSE: 98 mg/dL (ref 65–99)
Potassium: 3.5 mmol/L (ref 3.5–5.1)
Sodium: 138 mmol/L (ref 135–145)

## 2016-04-23 LAB — I-STAT TROPONIN, ED: TROPONIN I, POC: 0 ng/mL (ref 0.00–0.08)

## 2016-04-23 NOTE — ED Notes (Addendum)
Patient presents from home with left sided chest pain that started today.  Also has pain in her left flank.  Had EGD done this week and was sedated for procedure.  Patient appears anxious in triage. Patient A&Ox4

## 2016-04-23 NOTE — ED Notes (Signed)
Pt inquiring about wait, stating her cp is worse. Updated her on wait and returned to recheck pt vitals and update her, pt no longer in the same place and would not respond to call in lobby

## 2016-04-24 NOTE — ED Notes (Signed)
No answer when named called for room.

## 2016-04-27 ENCOUNTER — Ambulatory Visit: Payer: Self-pay | Admitting: Neurology

## 2016-06-08 ENCOUNTER — Ambulatory Visit: Payer: Medicaid Other | Admitting: Neurology

## 2016-06-21 ENCOUNTER — Ambulatory Visit: Payer: Medicaid Other | Admitting: Adult Health

## 2016-08-16 ENCOUNTER — Telehealth: Payer: Self-pay | Admitting: Neurology

## 2016-08-16 ENCOUNTER — Telehealth: Payer: Self-pay

## 2016-08-16 NOTE — Telephone Encounter (Signed)
[facility]. Patient asked to come in sooner, her headaches are worse. Dr. Frances FurbishAthar does not have an opening soon. Patient asked to get in before she starts [employer]. I offered appt with NP for next week, patient was agreeable to that.

## 2016-08-16 NOTE — Telephone Encounter (Signed)
Yes they were and have been sent back through medical records.

## 2016-08-16 NOTE — Telephone Encounter (Signed)
Pt called about forms for [employer] being sent last week and wanted to know if they have been done. Please call and advise [phone] phone.

## 2016-08-16 NOTE — Telephone Encounter (Signed)
[facility] @ [phone].

## 2016-08-21 ENCOUNTER — Ambulatory Visit (INDEPENDENT_AMBULATORY_CARE_PROVIDER_SITE_OTHER): Payer: Medicaid Other | Admitting: Adult Health

## 2016-08-21 ENCOUNTER — Encounter: Payer: Self-pay | Admitting: Adult Health

## 2016-08-21 VITALS — BP 129/76 | HR 98 | Ht 63.0 in | Wt 212.0 lb

## 2016-08-21 DIAGNOSIS — R51 Headache: Secondary | ICD-10-CM

## 2016-08-21 DIAGNOSIS — G43011 Migraine without aura, intractable, with status migrainosus: Secondary | ICD-10-CM

## 2016-08-21 DIAGNOSIS — R519 Headache, unspecified: Secondary | ICD-10-CM

## 2016-08-21 MED ORDER — NORTRIPTYLINE HCL 50 MG PO CAPS: 50.0000 mg | ORAL_CAPSULE | Freq: Every day | ORAL | 5 refills | Status: DC

## 2016-08-21 MED ORDER — KETOROLAC TROMETHAMINE 30 MG/ML IJ SOLN
30.0000 mg | Freq: Once | INTRAMUSCULAR | Status: AC
  Administered 2016-08-21: 30 mg via INTRAMUSCULAR

## 2016-08-21 NOTE — Patient Instructions (Signed)
Increase Nortriptyline to 50 mg at bedtime MRI brain Infusion today for headache If your symptoms worsen or you develop new symptoms please let us know.

## 2016-08-21 NOTE — Progress Notes (Addendum)
PATIENT: Nichole Bush DOB: Oct 28, 1996  REASON FOR VISIT: follow up- headache HISTORY FROM: patient  HISTORY OF PRESENT ILLNESS: Today 08/21/2016 Nichole Bush is an 20 year old female with a history of migraine headaches. She returns today for follow-up. She states that she has had a headache for the last 2 weeks. She states that headache is located across the forehead. She does confirm photophobia and phonophobia denies nausea or vomiting. She states that she has increased her usage for Excedrin Migraine taking up to 4 tablets a day. She states that her headaches always occur in the morning and tends to get better as the day progresses. She reports that during the headache sometimes she will have vision changes such as blurry vision. She states that she has followed up with her eye doctor and she was told her exam was normal. I have not seen these reports. She is currently taking nortriptyline 30 mg at bedtime. She reports initially this was beneficial. She returns today for an evaluation.  HISTORY Nichole Bush is an 20 year old right-handed woman with an underlying medical history of asthma, allergies, and obesity, who presents for follow-up consultation of her recurrent migraines. The patient is unaccompanied today. I first met her on 03/02/2016 at the request of her primary care physician, at which time she reported a long-standing history of migraine headaches since childhood. I suggested we proceed with a sleep study as she also reported some symptoms concerning for underlying obstructive sleep apnea. For preventative medication for migraines I suggested a trial of nortriptyline, starting at 10 mg with gradual titration. She had a baseline sleep study on 03/26/2016. I went over her test results with her in detail today. Sleep efficiency was 91.3% with a normal sleep latency of 22 minutes and wake after sleep onset of 20 minutes with mild sleep fragmentation noted. She had a normal arousal index. She  had an increased percentage of stage II sleep, a normal percentage of slow-wave sleep and a decreased percentage of REM sleep at 11.9% with a prolonged REM latency of 232 minutes. She had mild PLMS 9.7 per hour resulting in 4.1 arousals per hour. She had mild to moderate snoring. Total AHI was 2.3 per hour. Average oxygen saturation was 97%, nadir was 89%.  Today, 04/20/2016: She reports doing a little better, had an eye exam in [facility], has an updated prescription for her glasses. She is taking 20 mg of pamelor at night and seems to tolerate it. She feels that her headache intensity is a little better but frequency seems to be about the same. She often wakes up with a headache. She does not have very much nausea with it. She does get sensitive to light. In the interim, on 03/13/2016 she presented to the emergency room with cough and congestion. She did not stay. She did not get a chest x-ray.  Previously:  03/02/2016: She reports a longer standing history of migraine headaches. She started having headaches when she was 20 years old. She was getting a headache about 3 times a week but in the last 4 weeks she has had a daily headache. She reports a left-sided pounding headache typically, associated with nausea, occasional vomiting, and photophobia, typically no aura described but occasional blurry vision reported. She has a family history of migraines in her father. Her parents are separated. She lived with her father for some time in [place]. He still lives there, mother lives in [place]. She has 3 siblings on father's side, 4 siblings  on mother's side. She has gained weight. She snores, at times loudly. Boyfriend has noted witnessed apneic pauses while she is asleep. Sleep is disrupted, she has nocturia about twice a night. She often wakes up with a headache. She drinks a lot of caffeine in the form of soda, usually at 2 L bottle of Pepsi per day and iced coffee, one large per day, she does not  remember the last time she drank water. She lives alone. She has been living alone for the past 3 months. She works at [employer] so. She is a nonsmoker denies using any illicit drugs. She does not drink alcohol. Bedtime is around 11 PM, wakeup time is around 6 AM as she takes her boyfriend to work. She denies any one-sided weakness or numbness. She has a large hairdo, but says, that she only recently had her hairstyle changed and it did not affect her headaches. I reviewed your office note from 02/25/2016, which you kindly included. She was given a prescription for Phenergan for nausea, and Imitrex 25 mg strength 1-2 pills as needed for migraine. She has tried the medications but feels she is too sleepy from them. She has tried over-the-counter medications including Excedrin Migraine and Tylenol PM, with no significant improvement.  She does not report being on prescription medication as a child for migraines. There is no family history of long QT, heart disease, and she has no personal or family history of lupus, or sickle cell trait or sickle cell disease.  REVIEW OF SYSTEMS: Out of a complete 14 system review of symptoms, the patient complains only of the following symptoms, and all other reviewed systems are negative.  Memory loss, dizziness, weakness, nausea, wheezing, light sensitivity, loss of vision, chills, fever, ringing in ears, daytime sleepiness, snoring  ALLERGIES: Allergies  Allergen Reactions  . Peanut-Containing Drug Products Anaphylaxis  . Penicillins Anaphylaxis, Hives and Other (See Comments)    Has patient had a PCN reaction causing immediate rash, facial/tongue/throat swelling, SOB or lightheadedness with hypotension: Yes Has patient had a PCN reaction causing severe rash involving mucus membranes or skin necrosis: No Has patient had a PCN reaction that required hospitalization No Has patient had a PCN reaction occurring within the last 10 years: No If all of the above answers  are "NO", then may proceed with Cephalosporin use.     HOME MEDICATIONS: Outpatient Medications Prior to Visit  Medication Sig Dispense Refill  . albuterol (PROVENTIL HFA;VENTOLIN HFA) 108 (90 Base) MCG/ACT inhaler Inhale 2 puffs into the lungs every 6 (six) hours as needed for wheezing or shortness of breath.    . diphenhydramine-acetaminophen (TYLENOL PM) 25-500 MG TABS tablet Take 1 tablet by mouth at bedtime.    . nortriptyline (PAMELOR) 10 MG capsule Take 3 capsules (30 mg total) by mouth at bedtime. 90 capsule 5  . ondansetron (ZOFRAN ODT) 4 MG disintegrating tablet Take 1 tablet (4 mg total) by mouth every 8 (eight) hours as needed for nausea or vomiting. 20 tablet 0  . polyethylene glycol powder (GLYCOLAX/MIRALAX) powder Take 17 g by mouth 2 (two) times daily. (Patient taking differently: Take 17 g by mouth daily as needed for mild constipation. ) 850 g 2  . predniSONE (DELTASONE) 20 MG tablet Take 2 tablets (40 mg total) by mouth daily. 10 tablet 0  . promethazine (PHENERGAN) 25 MG tablet TK 1 T PO Q 6 H  0  . SUMAtriptan (IMITREX) 25 MG tablet   3   No facility-administered medications prior  to visit.     PAST MEDICAL HISTORY: Past Medical History:  Diagnosis Date  . Constipation   . Hypertension   . Migraine   . Thyroid disease     PAST SURGICAL HISTORY: Past Surgical History:  Procedure Laterality Date  . ESOPHAGOGASTRODUODENOSCOPY      FAMILY HISTORY: Family History  Problem Relation Age of Onset  . Migraines Father   . Hypertension Father     SOCIAL HISTORY: Social History   Social History  . Marital status: Single    Spouse name: N/A  . Number of children: N/A  . Years of education: HS   Occupational History  . [employer]    Social History Main Topics  . Smoking status: Never Smoker  . Smokeless tobacco: Never Used  . Alcohol use No  . Drug use: No  . Sexual activity: Not on file   Other Topics Concern  . Not on file   Social History Narrative    States that she drinks "a lot" of soda daily       PHYSICAL EXAM  Vitals:   08/21/16 1532  BP: 129/76  Pulse: 98  Weight: 212 lb (96.2 kg)  Height: 5' 3" (1.6 m)   Body mass index is 37.55 kg/m.  Generalized: Well developed, in no acute distress   Neurological examination  Mentation: Alert oriented to time, place, history taking. Follows all commands speech and language fluent Cranial nerve II-XII: Funduscopic exam hard to visualize optic disks. Pupils were equal round reactive to light. Extraocular movements were full, visual field were full on confrontational test. Facial sensation and strength were normal. Uvula tongue midline. Head turning and shoulder shrug  were normal and symmetric. Motor: The motor testing reveals 5 over 5 strength of all 4 extremities. Good symmetric motor tone is noted throughout.  Sensory: Sensory testing is intact to soft touch on all 4 extremities. No evidence of extinction is noted.  Coordination: Cerebellar testing reveals good finger-nose-finger and heel-to-shin bilaterally.  Gait and station: Gait is normal. Tandem gait is normal. Romberg is negative. No drift is seen.  Reflexes: Deep tendon reflexes are symmetric and normal bilaterally.   DIAGNOSTIC DATA (LABS, IMAGING, TESTING) - I reviewed patient records, labs, notes, testing and imaging myself where available.  Lab Results  Component Value Date   WBC 9.4 04/23/2016   HGB 12.2 04/23/2016   HCT 35.8 (L) 04/23/2016   MCV 88.2 04/23/2016   PLT 347 04/23/2016      Component Value Date/Time   NA 138 04/23/2016 2110   K 3.5 04/23/2016 2110   CL 105 04/23/2016 2110   CO2 26 04/23/2016 2110   GLUCOSE 98 04/23/2016 2110   BUN <5 (L) 04/23/2016 2110   CREATININE 0.70 04/23/2016 2110   CALCIUM 9.5 04/23/2016 2110   PROT 7.2 02/16/2016 1015   ALBUMIN 3.7 02/16/2016 1015   AST 34 02/16/2016 1015   ALT 44 02/16/2016 1015   ALKPHOS 75 02/16/2016 1015   BILITOT 0.4 02/16/2016 1015    GFRNONAA >60 04/23/2016 2110   GFRAA >60 04/23/2016 2110       ASSESSMENT AND PLAN 20 y.o. year old female  has a past medical history of Constipation; Hypertension; Migraine; and Thyroid disease. here with::  1. Migraine headaches  The patient continues to have frequent headaches. She's had a headache for the last 2 weeks with minimal relief. I will increase nortriptyline 50 mg at bedtime. We will order an MRI of the brain to look for  any acute changes that could be the cause of the frequent headaches. I will also give an injection of Toradol 30 mg IM today. If her symptoms worsen or she develops any new symptoms she should let us know. Follow-up in 3 months with Dr. Carmelia Bake, MSN, NP-C 08/21/2016, 3:20 PM [facility] [address] [place], [place] [address] [phone]  I reviewed the above note and documentation by the Nurse Practitioner and agree with the history, physical exam, assessment and plan as outlined above. I was immediately available for face-to-face consultation. Star Age, MD, PhD [facility])

## 2016-09-13 ENCOUNTER — Emergency Department (HOSPITAL_COMMUNITY): Payer: Medicaid Other

## 2016-09-13 ENCOUNTER — Encounter (HOSPITAL_COMMUNITY): Payer: Self-pay | Admitting: Emergency Medicine

## 2016-09-13 ENCOUNTER — Emergency Department (HOSPITAL_COMMUNITY)
Admission: EM | Admit: 2016-09-13 | Discharge: 2016-09-13 | Disposition: A | Discharge status: Home / Self Care | Payer: Medicaid Other | Attending: Emergency Medicine | Admitting: Emergency Medicine

## 2016-09-13 DIAGNOSIS — I10 Essential (primary) hypertension: Secondary | ICD-10-CM | POA: Diagnosis not present

## 2016-09-13 DIAGNOSIS — Z91010 Allergy to peanuts: Secondary | ICD-10-CM | POA: Diagnosis not present

## 2016-09-13 DIAGNOSIS — Z7982 Long term (current) use of aspirin: Secondary | ICD-10-CM | POA: Diagnosis not present

## 2016-09-13 DIAGNOSIS — R1030 Lower abdominal pain, unspecified: Secondary | ICD-10-CM | POA: Diagnosis present

## 2016-09-13 DIAGNOSIS — K5909 Other constipation: Secondary | ICD-10-CM | POA: Diagnosis not present

## 2016-09-13 LAB — COMPREHENSIVE METABOLIC PANEL
ALK PHOS: 68 U/L (ref 38–126)
ALT: 51 U/L (ref 14–54)
AST: 40 U/L (ref 15–41)
Albumin: 3.9 g/dL (ref 3.5–5.0)
Anion gap: 6 (ref 5–15)
BILIRUBIN TOTAL: 0.7 mg/dL (ref 0.3–1.2)
BUN: 5 mg/dL — AB (ref 6–20)
CALCIUM: 9.7 mg/dL (ref 8.9–10.3)
CO2: 24 mmol/L (ref 22–32)
Chloride: 108 mmol/L (ref 101–111)
Creatinine, Ser: 0.6 mg/dL (ref 0.44–1.00)
GFR calc Af Amer: >60 mL/min (ref >60)
Glucose, Bld: 110 mg/dL — ABNORMAL HIGH (ref 65–99)
POTASSIUM: 3.5 mmol/L (ref 3.5–5.1)
Sodium: 138 mmol/L (ref 135–145)
TOTAL PROTEIN: 7.6 g/dL (ref 6.5–8.1)

## 2016-09-13 LAB — URINALYSIS, ROUTINE W REFLEX MICROSCOPIC
BILIRUBIN URINE: NEGATIVE
Glucose, UA: NEGATIVE mg/dL
Hgb urine dipstick: NEGATIVE
KETONES UR: NEGATIVE mg/dL
LEUKOCYTES UA: NEGATIVE
NITRITE: NEGATIVE
PH: 6.5 (ref 5.0–8.0)
PROTEIN: NEGATIVE mg/dL
Specific Gravity, Urine: 1.02 (ref 1.005–1.030)

## 2016-09-13 LAB — CBC
HEMATOCRIT: 36.1 % (ref 36.0–46.0)
Hemoglobin: 12.2 g/dL (ref 12.0–15.0)
MCH: 29.8 pg (ref 26.0–34.0)
MCHC: 33.8 g/dL (ref 30.0–36.0)
MCV: 88 fL (ref 78.0–100.0)
PLATELETS: 330 10*3/uL (ref 150–400)
RBC: 4.1 MIL/uL (ref 3.87–5.11)
RDW: 12.6 % (ref 11.5–15.5)
WBC: 6.2 10*3/uL (ref 4.0–10.5)

## 2016-09-13 LAB — I-STAT BETA HCG BLOOD, ED (MC, WL, AP ONLY)

## 2016-09-13 LAB — PREGNANCY, URINE: Preg Test, Ur: NEGATIVE

## 2016-09-13 LAB — LIPASE, BLOOD: Lipase: 26 U/L (ref 11–51)

## 2016-09-13 MED ORDER — MAGNESIUM CITRATE PO SOLN: 1.0000 | Freq: Once | ORAL | 0 refills | Status: AC

## 2016-09-13 MED ORDER — PEG 3350-KCL-NABCB-NACL-NASULF 236 G PO SOLR: 4.0000 L | Freq: Once | ORAL | 0 refills | Status: AC

## 2016-09-13 NOTE — ED Notes (Signed)
Patient transported to X-ray 

## 2016-09-13 NOTE — ED Notes (Signed)
Patient returned to room. 

## 2016-09-13 NOTE — ED Provider Notes (Addendum)
[facility]-EMERGENCY DEPT Provider Note   CSN: [id number] Arrival date & time: 09/13/16  1243     History   Chief Complaint Chief Complaint  Patient presents with  . Abdominal Pain    HPI Nichole Bush is a 20 y.o. female she presented with lower abdominal pain, burning sensation. Patient states that she's been having lower abdominal pain for the last week or so. She states that she does have some dysuria and she is chronically constipated. States that she is taking less as well as taking MiraLAX twice daily And last bowel movement was several days ago. Denies any vaginal discharge. Patient states that she is sexually active and will have sex 3 days ago. She was checked for STD about week ago and did not have STD at that time. She denies any vomiting or fever. Had been admitted for constipation in the last. Has GI doctor and had previous upper endoscopy.       The history is provided by the patient.    Past Medical History:  Diagnosis Date  . Constipation   . Hypertension   . Migraine   . Thyroid disease     Patient Active Problem List   Diagnosis Date Noted  . Constipation 04/22/2015  . CN (constipation)     Past Surgical History:  Procedure Laterality Date  . ESOPHAGOGASTRODUODENOSCOPY      OB History    No data available       Home Medications    Prior to Admission medications   Medication Sig Start Date End Date Taking? Authorizing Provider  albuterol (PROVENTIL HFA;VENTOLIN HFA) 108 (90 Base) MCG/ACT inhaler Inhale 2 puffs into the lungs every 6 (six) hours as needed for wheezing or shortness of breath.   Yes Historical Provider, MD  aspirin-acetaminophen-caffeine (EXCEDRIN MIGRAINE) [phone] MG tablet Take by mouth every 6 (six) hours as needed for headache.   Yes Historical Provider, MD  cetirizine (ZYRTEC) 10 MG tablet One 10mg  tablet daily. 06/21/16  Yes Historical Provider, MD  LINZESS 290 MCG CAPS capsule Take 290 mcg by mouth daily before breakfast.   06/05/16  Yes Historical Provider, MD  MONONESSA 0.25-35 MG-MCG tablet Take 1 tablet by mouth daily. 08/22/16  Yes Historical Provider, MD  nortriptyline (PAMELOR) 50 MG capsule Take 1 capsule (50 mg total) by mouth at bedtime. 08/21/16  Yes Butch PennyMegan Millikan, NP  omeprazole (PRILOSEC) 40 MG capsule Take 40 mg by mouth daily.  06/21/16  Yes Historical Provider, MD  ondansetron (ZOFRAN ODT) 4 MG disintegrating tablet Take 1 tablet (4 mg total) by mouth every 8 (eight) hours as needed for nausea or vomiting. 09/14/15  Yes Deirdre PeerJeremiah Gaddy, MD  polyethylene glycol powder (GLYCOLAX/MIRALAX) powder Take 17 g by mouth 2 (two) times daily. Patient taking differently: Take 17 g by mouth daily as needed for mild constipation.  04/24/15  Yes Saverio DankerSarah E Stephens, MD  predniSONE (DELTASONE) 20 MG tablet Take 2 tablets (40 mg total) by mouth daily. 02/16/16  Yes Gwyneth SproutWhitney Plunkett, MD  SUMAtriptan (IMITREX) 25 MG tablet  02/25/16  Yes Historical Provider, MD  promethazine (PHENERGAN) 25 MG tablet One 25mg  tablet daily prn for nausea and vomiting 02/25/16   Historical Provider, MD    Family History Family History  Problem Relation Age of Onset  . Migraines Father   . Hypertension Father     Social History Social History  Substance Use Topics  . Smoking status: Never Smoker  . Smokeless tobacco: Never Used  . Alcohol use No  Allergies   Peanut-containing drug products and Penicillins   Review of Systems Review of Systems  Gastrointestinal: Positive for abdominal pain.  All other systems reviewed and are negative.    Physical Exam Updated Vital Signs BP 125/80 (BP Location: Left Arm)   Pulse 89   Temp 98.1 F (36.7 C) (Oral)   Resp 16   Ht 5\' 2"  (1.575 m)   Wt 206 lb 14.4 oz (93.8 kg)   LMP 08/21/2016 Comment: neg preg test 09/13/16  SpO2 100%   BMI 37.84 kg/m   Physical Exam  Constitutional: She is oriented to person, place, and time. She appears well-developed.  HENT:  Head: Normocephalic.    Eyes: Pupils are equal, round, and reactive to light.  Neck: Normal range of motion.  Cardiovascular: Normal rate, regular rhythm and normal heart sounds.   Pulmonary/Chest: Effort normal and breath sounds normal. No respiratory distress. She has no wheezes. She has no rales.  Abdominal: Soft. Bowel sounds are normal.  Mild suprapubic and LLQ tenderness. No rebound or guarding   Musculoskeletal: Normal range of motion.  Neurological: She is alert and oriented to person, place, and time. No cranial nerve deficit. Coordination normal.  Skin: Skin is warm.  Psychiatric: She has a normal mood and affect.  Nursing note and vitals reviewed.    ED Treatments / Results  Labs (all labs ordered are listed, but only abnormal results are displayed) Labs Reviewed  COMPREHENSIVE METABOLIC PANEL - Abnormal; Notable for the following:       Result Value   Glucose, Bld 110 (*)    BUN 5 (*)    All other components within normal limits  LIPASE, BLOOD  CBC  URINALYSIS, ROUTINE W REFLEX MICROSCOPIC (NOT AT [facility])  PREGNANCY, URINE  I-STAT BETA HCG BLOOD, ED ([facility], [facility], [facility] ONLY)    EKG  EKG Interpretation None       Radiology Dg Abd Acute W/chest  Result Date: 09/13/2016 CLINICAL DATA:  Lower abdominal pain and constipation for 3 days EXAM: DG ABDOMEN ACUTE W/ 1V CHEST COMPARISON:  04/23/2016 FINDINGS: Cardiac shadow is within normal limits. The lungs are clear bilaterally. Scattered large and small bowel gas is noted. Fecal material is noted throughout the colon. No abnormal mass or abnormal calcifications are seen. No free air is noted. No bony abnormality is seen. IMPRESSION: No acute abnormality noted. Electronically Signed   By: Alcide Clever M.D.   On: 09/13/2016 15:10    Procedures Procedures (including critical care time)  Medications Ordered in ED Medications - No data to display   Initial Impression / Assessment and Plan / ED Course  I have reviewed the triage vital signs and the  nursing notes.  Pertinent labs & imaging results that were available during my care of the patient were reviewed by me and considered in my medical decision making (see chart for details).  Clinical Course   Kalle Bernath is a 20 y.o. female here with constipation, ab pain, dysuria. Likely UTI vs constipation. Will get labs, pregnancy, UA, xrays.   3:28 PM  Labs unremarkable. UA nl. Xray showed constipation but no SBO or ileus. Patient requested NG tube with golytely. However, given that labs and UA unremarkable, she doesn't qualify for admission. Offered enema but refused. States that she can give herself an enema at home. Told her to take miralax more frequently. She has GI doctor that she follow up with and is taking miralax currently. Told her to call GI, take golytely,  magnesium citrate, use enema at home. I don't think she need NG tube with golytely currently.   Final Clinical Impressions(s) / ED Diagnoses   Final diagnoses:  None    New Prescriptions New Prescriptions   No medications on file     Charlynne Pander, MD 09/13/16 1530    Charlynne Pander, MD 09/13/16 1534

## 2016-09-13 NOTE — Discharge Instructions (Signed)
Take golytely and magnesium citrate today.   Stay hydrated with plenty of fluids.   Eat plenty of fiber.   Call your GI doctor for follow up   Return to ER if you have worsening abdominal pain, unable to use the bathroom, fever, vomiting.

## 2016-09-13 NOTE — ED Notes (Signed)
Patient is asking for fluids.  Water given - patient able to drink without difficulty.

## 2016-09-13 NOTE — ED Triage Notes (Signed)
Pt c/o burning sensation in lower abd-- no burning on urination, no vag discharge.

## 2016-09-18 DIAGNOSIS — K2100 Gastro-esophageal reflux disease with esophagitis, without bleeding: Secondary | ICD-10-CM | POA: Insufficient documentation

## 2016-10-09 ENCOUNTER — Encounter (HOSPITAL_COMMUNITY): Payer: Self-pay

## 2016-10-09 ENCOUNTER — Emergency Department (HOSPITAL_COMMUNITY)
Admission: EM | Admit: 2016-10-09 | Discharge: 2016-10-09 | Disposition: A | Discharge status: Home / Self Care | Payer: Medicaid Other | Attending: Emergency Medicine | Admitting: Emergency Medicine

## 2016-10-09 DIAGNOSIS — I10 Essential (primary) hypertension: Secondary | ICD-10-CM | POA: Insufficient documentation

## 2016-10-09 DIAGNOSIS — R0602 Shortness of breath: Secondary | ICD-10-CM | POA: Diagnosis present

## 2016-10-09 DIAGNOSIS — Z91010 Allergy to peanuts: Secondary | ICD-10-CM | POA: Insufficient documentation

## 2016-10-09 DIAGNOSIS — Z79899 Other long term (current) drug therapy: Secondary | ICD-10-CM | POA: Insufficient documentation

## 2016-10-09 DIAGNOSIS — Z7982 Long term (current) use of aspirin: Secondary | ICD-10-CM | POA: Diagnosis not present

## 2016-10-09 DIAGNOSIS — R42 Dizziness and giddiness: Secondary | ICD-10-CM | POA: Insufficient documentation

## 2016-10-09 LAB — CARBOXYHEMOGLOBIN - COOX: Carboxyhemoglobin: 0.5 % (ref 0.5–1.5)

## 2016-10-09 MED ORDER — ALBUTEROL SULFATE HFA 108 (90 BASE) MCG/ACT IN AERS: 1.0000 | INHALATION_SPRAY | Freq: Four times a day (QID) | RESPIRATORY_TRACT | 0 refills | Status: DC | PRN

## 2016-10-09 NOTE — ED Triage Notes (Signed)
Pt complaining of migraine and nausea. Pt states furnace issues at home. Wants to be checked for carbon monoxide poisoning.

## 2016-10-09 NOTE — Discharge Instructions (Signed)
No elevation of your carbon monoxide level was detected today.  Albuterol as needed for asthma.

## 2016-10-09 NOTE — ED Provider Notes (Signed)
[facility]-EMER[facility]ENCY DEPT Provider Note   CSN: [id number] Arrival date & time: 10/09/16  1821     History   Chief Complaint Chief Complaint  Patient presents with  . Shortness of Breath    HPI Nichole Bush is a 20 y.o. female. She presents with her mother-in-law from her house. Apparently there is concern about an odd odor in the house. Post patient's complaint of dizziness.  [employer] came to the house and told them that there was a problem with the gas furnace and it was shut off. The gases nausea after the house as is the purpose. She presents with a complaint of dizziness. No nausea. Occasional cough. Uses an inhaler but not today.  HPI  Past Medical History:  Diagnosis Date  . Constipation   . Hypertension   . Migraine   . Thyroid disease     Patient Active Problem List   Diagnosis Date Noted  . Constipation 04/22/2015  . CN (constipation)     Past Surgical History:  Procedure Laterality Date  . ESOPHAGOGASTRODUODENOSCOPY      OB History    No data available       Home Medications    Prior to Admission medications   Medication Sig Start Date End Date Taking? Authorizing Provider  albuterol (PROVENTIL HFA;VENTOLIN HFA) 108 (90 Base) MCG/ACT inhaler Inhale 2 puffs into the lungs every 6 (six) hours as needed for wheezing or shortness of breath.    Historical Provider, MD  aspirin-acetaminophen-caffeine (EXCEDRIN MIGRAINE) [phone] MG tablet Take by mouth every 6 (six) hours as needed for headache.    Historical Provider, MD  cetirizine (ZYRTEC) 10 MG tablet One 10mg  tablet daily. 06/21/16   Historical Provider, MD  LINZESS 290 MCG CAPS capsule Take 290 mcg by mouth daily before breakfast.  06/05/16   Historical Provider, MD  MONONESSA 0.25-35 MG-MCG tablet Take 1 tablet by mouth daily. 08/22/16   Historical Provider, MD  nortriptyline (PAMELOR) 50 MG capsule Take 1 capsule (50 mg total) by mouth at bedtime. 08/21/16   Butch PennyMegan Millikan, NP  omeprazole (PRILOSEC) 40  MG capsule Take 40 mg by mouth daily.  06/21/16   Historical Provider, MD  ondansetron (ZOFRAN ODT) 4 MG disintegrating tablet Take 1 tablet (4 mg total) by mouth every 8 (eight) hours as needed for nausea or vomiting. 09/14/15   Deirdre PeerJeremiah Gaddy, MD  polyethylene glycol powder (GLYCOLAX/MIRALAX) powder Take 17 g by mouth 2 (two) times daily. Patient taking differently: Take 17 g by mouth daily as needed for mild constipation.  04/24/15   Saverio DankerSarah E Stephens, MD  predniSONE (DELTASONE) 20 MG tablet Take 2 tablets (40 mg total) by mouth daily. 02/16/16   Gwyneth SproutWhitney Plunkett, MD  promethazine (PHENERGAN) 25 MG tablet One 25mg  tablet daily prn for nausea and vomiting 02/25/16   Historical Provider, MD  SUMAtriptan (IMITREX) 25 MG tablet  02/25/16   Historical Provider, MD    Family History Family History  Problem Relation Age of Onset  . Migraines Father   . Hypertension Father     Social History Social History  Substance Use Topics  . Smoking status: Never Smoker  . Smokeless tobacco: Never Used  . Alcohol use No     Allergies   Peanut-containing drug products and Penicillins   Review of Systems Review of Systems  Constitutional: Negative for appetite change, chills, diaphoresis, fatigue and fever.  HENT: Negative for mouth sores, sore throat and trouble swallowing.   Eyes: Negative for visual disturbance.  Respiratory:  Negative for cough, chest tightness, shortness of breath and wheezing.   Cardiovascular: Negative for chest pain.  Gastrointestinal: Negative for abdominal distention, abdominal pain, diarrhea, nausea and vomiting.  Endocrine: Negative for polydipsia, polyphagia and polyuria.  Genitourinary: Negative for dysuria, frequency and hematuria.  Musculoskeletal: Negative for gait problem.  Skin: Negative for color change, pallor and rash.  Neurological: Positive for dizziness. Negative for syncope, light-headedness and headaches.  Hematological: Does not bruise/bleed easily.    Psychiatric/Behavioral: Negative for behavioral problems and confusion.     Physical Exam Updated Vital Signs BP (!) 107/52   Pulse 72   Temp 98.2 F (36.8 C) (Oral)   Resp 16   LMP 08/21/2016 Comment: neg preg test 09/13/16  SpO2 99%   Physical Exam  Constitutional: She is oriented to person, place, and time. She appears well-developed and well-nourished. No distress.  HENT:  Head: Normocephalic.  Eyes: Conjunctivae are normal. Pupils are equal, round, and reactive to light. No scleral icterus.  Neck: Normal range of motion. Neck supple. No thyromegaly present.  Cardiovascular: Normal rate and regular rhythm.  Exam reveals no gallop and no friction rub.   No murmur heard. Pulmonary/Chest: Effort normal and breath sounds normal. No respiratory distress. She has no wheezes. She has no rales.  Abdominal: Soft. Bowel sounds are normal. She exhibits no distension. There is no tenderness. There is no rebound.  Musculoskeletal: Normal range of motion.  Neurological: She is alert and oriented to person, place, and time.  Skin: Skin is warm and dry. No rash noted.  Psychiatric: She has a normal mood and affect. Her behavior is normal.     ED Treatments / Results  Labs (all labs ordered are listed, but only abnormal results are displayed) Labs Reviewed  CARBOXYHEMOGLOBIN - COOX    EKG  EKG Interpretation None       Radiology No results found.  Procedures Procedures (including critical care time)  Medications Ordered in ED Medications - No data to display   Initial Impression / Assessment and Plan / ED Course  I have reviewed the triage vital signs and the nursing notes.  Pertinent labs & imaging results that were available during my care of the patient were reviewed by me and considered in my medical decision making (see chart for details).  Clinical Course     Await  carboxyhemoglobin level. Placed on O2.  Final Clinical Impressions(s) / ED Diagnoses    Final diagnoses:  Dizziness    New Prescriptions New Prescriptions   No medications on file     Rolland PorterMark Carolann Brazell, MD 10/14/16 1442

## 2016-10-30 ENCOUNTER — Ambulatory Visit (INDEPENDENT_AMBULATORY_CARE_PROVIDER_SITE_OTHER): Payer: Medicaid Other | Admitting: Neurology

## 2016-10-30 ENCOUNTER — Encounter: Payer: Self-pay | Admitting: Neurology

## 2016-10-30 VITALS — BP 108/60 | HR 78 | Resp 16 | Ht 63.0 in | Wt 202.0 lb

## 2016-10-30 DIAGNOSIS — G43011 Migraine without aura, intractable, with status migrainosus: Secondary | ICD-10-CM

## 2016-10-30 DIAGNOSIS — G43009 Migraine without aura, not intractable, without status migrainosus: Secondary | ICD-10-CM

## 2016-10-30 MED ORDER — NORTRIPTYLINE HCL 50 MG PO CAPS: 50.0000 mg | ORAL_CAPSULE | Freq: Every day | ORAL | 5 refills | Status: DC

## 2016-10-30 MED ORDER — GABAPENTIN 100 MG PO CAPS: 100.0000 mg | ORAL_CAPSULE | Freq: Every day | ORAL | 5 refills | Status: DC

## 2016-10-30 MED ORDER — SUMATRIPTAN SUCCINATE 25 MG PO TABS: 25.0000 mg | ORAL_TABLET | Freq: Once | ORAL | 5 refills | Status: DC

## 2016-10-30 NOTE — Progress Notes (Signed)
Subjective:    Patient ID: Nichole Bush is a 20 y.o. female.  HPI     Interim history:   Nichole Bush is an 20 year old right-handed woman with an underlying medical history of asthma, allergies, and obesity, who presents for follow-up consultation of her recurrent migraines. The patient is unaccompanied today. I last saw her on 04/20/2016, at which time she reported doing a little better, had an updated eye exam in any prescription for her eyeglasses. She was taking nortriptyline 20 mg at night and was tolerating it. She felt it helped. In the interim, she presented in the past 6 months 3 times to the emergency room for different issues including chest pain, abdominal pain, and dizziness.  Today, 10/30/2016: She reports doing okay, has been able to continue with nortriptyline but had to come off of all Tylenol products because of liver enzymes coming back abnormal twice. She was seen in the interim by [name], NP on 08/21/2016 at which time the nortriptyline was increased to 50 mg each night. She ran out of her prescription for sumatriptan 25 mg tablet. an MRI was ordered but was not done. Liver enzymes were normal on 09/13/16.  Of note, as a preteen, she tried topamax, which she recalls, did not work. Sumatriptan helps, but makes her somewhat sleepy.  She has not been on Depakote. No Inderal. Saw a GI specialist at [facility], is supposed to see a liver specialist from what I understand. Still has about 3 headaches per week. They can last up to 2 days at a time. She has practically eliminated all sodas and tea.  Previously:   I first met her on 03/02/2016 at the request of her primary care physician, at which time she reported a long-standing history of migraine headaches since childhood. I suggested we proceed with a sleep study as she also reported some symptoms concerning for underlying obstructive sleep apnea. For preventative medication for migraines I suggested a trial of  nortriptyline, starting at 10 mg with gradual titration. She had a baseline sleep study on 03/26/2016. Sleep efficiency was 91.3% with a normal sleep latency of 22 minutes and wake after sleep onset of 20 minutes with mild sleep fragmentation noted. She had a normal arousal index. She had an increased percentage of stage II sleep, a normal percentage of slow-wave sleep and a decreased percentage of REM sleep at 11.9% with a prolonged REM latency of 232 minutes. She had mild PLMS 9.7 per hour resulting in 4.1 arousals per hour. She had mild to moderate snoring. Total AHI was 2.3 per hour. Average oxygen saturation was 97%, nadir was 89%.   03/02/2016: She reports a longer standing history of migraine headaches. She started having headaches when she was 20 years old. She was getting a headache about 3 times a week but in the last 4 weeks she has had a daily headache. She reports a left-sided pounding headache typically, associated with nausea, occasional vomiting, and photophobia, typically no aura described but occasional blurry vision reported. She has a family history of migraines in her father. Her parents are separated. She lived with her father for some time in [place]. He still lives there, mother lives in [place]. She has 3 siblings on father's side, 4 siblings on mother's side. She has gained weight. She snores, at times loudly. Boyfriend has noted witnessed apneic pauses while she is asleep. Sleep is disrupted, she has nocturia about twice a night. She often wakes up with a headache. She  drinks a lot of caffeine in the form of soda, usually at 2 L bottle of Pepsi per day and iced coffee, one large per day, she does not remember the last time she drank water. She lives alone. She has been living alone for the past 3 months. She works at [employer]. She is a nonsmoker denies using any illicit drugs. She does not drink alcohol. Bedtime is around 11 PM, wakeup time is around 6 AM as she takes her  boyfriend to work. She denies any one-sided weakness or numbness. She has a large hairdo, but says, that she only recently had her hairstyle changed and it did not affect her headaches. I reviewed your office note from 02/25/2016, which you kindly included. She was given a prescription for Phenergan for nausea, and Imitrex 25 mg strength 1-2 pills as needed for migraine. She has tried the medications but feels she is too sleepy from them. She has tried over-the-counter medications including Excedrin Migraine and Tylenol PM, with no significant improvement.   She does not report being on prescription medication as a child for migraines. There is no family history of long QT, heart disease, and she has no personal or family history of lupus, or sickle cell trait or sickle cell disease.  Her Past Medical History Is Significant For: Past Medical History:  Diagnosis Date  . Constipation   . Hypertension   . Migraine   . Thyroid disease     Her Past Surgical History Is Significant For: Past Surgical History:  Procedure Laterality Date  . ESOPHAGOGASTRODUODENOSCOPY      Her Family History Is Significant For: Family History  Problem Relation Age of Onset  . Migraines Father   . Hypertension Father     Her Social History Is Significant For: Social History   Social History  . Marital status: Single    Spouse name: N/A  . Number of children: N/A  . Years of education: HS   Occupational History  . [employer]    Social History Main Topics  . Smoking status: Never Smoker  . Smokeless tobacco: Never Used  . Alcohol use No  . Drug use: No  . Sexual activity: Not Asked   Other Topics Concern  . None   Social History Narrative   Lives at home w/ her mom   Left-handed   Caffeine: occasional tea, has stopped drinking sodas x2 months (10/30/2016)       Her Allergies Are:  Allergies  Allergen Reactions  . Peanut-Containing Drug Products Anaphylaxis  . Penicillins Anaphylaxis, Hives  and Other (See Comments)    Has patient had a PCN reaction causing immediate rash, facial/tongue/throat swelling, SOB or lightheadedness with hypotension: Yes Has patient had a PCN reaction causing severe rash involving mucus membranes or skin necrosis: No Has patient had a PCN reaction that required hospitalization No Has patient had a PCN reaction occurring within the last 10 years: No If all of the above answers are "NO", then may proceed with Cephalosporin use.   :   Her Current Medications Are:  Outpatient Encounter Prescriptions as of 10/30/2016  Medication Sig  . albuterol (PROVENTIL HFA;VENTOLIN HFA) 108 (90 Base) MCG/ACT inhaler Inhale 1-2 puffs into the lungs every 6 (six) hours as needed for wheezing.  . cetirizine (ZYRTEC) 10 MG tablet One 31m tablet daily.  .Marland KitchenLINZESS 290 MCG CAPS capsule Take 290 mcg by mouth daily before breakfast.   . MONONESSA 0.25-35 MG-MCG tablet Take 1 tablet by mouth daily.  .Marland Kitchen  nortriptyline (PAMELOR) 50 MG capsule Take 1 capsule (50 mg total) by mouth at bedtime.  Marland Kitchen omeprazole (PRILOSEC) 40 MG capsule Take 40 mg by mouth daily.   . ondansetron (ZOFRAN ODT) 4 MG disintegrating tablet Take 1 tablet (4 mg total) by mouth every 8 (eight) hours as needed for nausea or vomiting.  . polyethylene glycol powder (GLYCOLAX/MIRALAX) powder Take 17 g by mouth 2 (two) times daily. (Patient taking differently: Take 17 g by mouth daily as needed for mild constipation. )  . predniSONE (DELTASONE) 20 MG tablet Take 2 tablets (40 mg total) by mouth daily.  . [DISCONTINUED] aspirin-acetaminophen-caffeine (EXCEDRIN MIGRAINE) 250-250-65 MG tablet Take by mouth every 6 (six) hours as needed for headache.  . [DISCONTINUED] promethazine (PHENERGAN) 25 MG tablet One 57m tablet daily prn for nausea and vomiting  . [DISCONTINUED] SUMAtriptan (IMITREX) 25 MG tablet    No facility-administered encounter medications on file as of 10/30/2016.   :  Review of Systems:  Out of a  complete 14 point review of systems, all are reviewed and negative with the exception of these symptoms as listed below: Review of Systems  Neurological:       Patient states that the Nortriptyline has decreased the frequency of her headaches to twice a week. Reports that she cannot take Tylenol products anymore due to liver enzymes coming back abnormal x2.  Not taking Sumatriptan due to not having anymore refills.  Patient reports waking up every 2 hours at night.     Objective:  Neurologic Exam  Physical Exam Physical Examination:   Vitals:   10/30/16 0928  BP: 108/60  Pulse: 78  Resp: 16    General Examination: The patient is a very pleasant 20y.o. female in no acute distress. She appears well-developed and well-nourished and adequately groomed.   HEENT: Normocephalic, atraumatic, pupils are equal, round and reactive to light and accommodation. Extraocular tracking is good without limitation to gaze excursion or nystagmus noted. Normal smooth pursuit is noted. She wears prescription eyeglasses. Hearing is grossly intact. Face is symmetric with normal facial animation and normal facial sensation. Speech is clear with no dysarthria noted. There is no hypophonia. There is no lip, neck/head, jaw or voice tremor. Neck is supple with full range of passive and active motion. There are no carotid bruits on auscultation. Oropharynx exam reveals: mild mouth dryness, adequate dental hygiene and moderate airway crowding, due to smaller airway entry, tonsils of 2-3+ bilaterally and thicker soft palate. Mallampati is class II. Tongue protrudes centrally and palate elevates symmetrically.  Chest: Clear to auscultation without wheezing, rhonchi or crackles noted.  Heart: S1+S2+0, regular and normal without murmurs, rubs or gallops noted.   Abdomen: Soft, non-tender and non-distended with normal bowel sounds appreciated on auscultation.  Extremities: There is no pitting edema in the distal lower  extremities bilaterally. Pedal pulses are intact.  Skin: Warm and dry without trophic changes noted. There are no varicose veins.  Musculoskeletal: exam reveals no obvious joint deformities, tenderness or joint swelling or erythema.   Neurologically:  Mental status: The patient is awake, alert and oriented in all 4 spheres. Her immediate and remote memory, attention, language skills and fund of knowledge are appropriate. There is no evidence of aphasia, agnosia, apraxia or anomia. Speech is clear with normal prosody and enunciation. Thought process is linear. Mood is normal and affect is normal.  Cranial nerves II - XII are as described above under HEENT exam. In addition: shoulder shrug is normal with equal  shoulder height noted. Motor exam: Normal bulk, strength and tone is noted. There is no drift, tremor or rebound. Romberg is negative. Reflexes are 2+ throughout. Babinski: Toes are flexor bilaterally. Fine motor skills and coordination: intact with normal finger taps, normal hand movements, normal rapid alternating patting, normal foot taps and normal foot agility.  Cerebellar testing: No dysmetria or intention tremor on finger to nose testing. Heel to shin is unremarkable bilaterally. There is no truncal or gait ataxia.  Sensory exam: intact to light touch, pinprick, vibration, temperature sense in the upper and lower extremities.  Gait, station and balance: She stands easily. No veering to one side is noted. No leaning to one side is noted. Posture is age-appropriate and stance is narrow based. Gait shows normal stride length and normal pace. No problems turning are noted. Tandem walk is unremarkable.   Assessment and Plan:   In summary, Nichole Bush is a very pleasant 20 year old female with an underlying medical history of asthma, allergies, and obesity, who presents with a history of migrainous headaches for at least 6 or 7 years duration. While her headache frequency and severity have  improved with nortriptyline, she still has migraines every week. She is currently on nortriptyline 50 mg each night. We started with 20 mg, then increased it to 30 mg. She tolerates it. She ran out of her prescription for sumatriptan which I renewed. She has reduced her caffeine intake and drinks well water. Her sleep study in April 2017 was negative for obstructive sleep apnea with the exception of mild REM related OSA. For this, she is encouraged to try to lose weight. Her weight has been fluctuating. I would like for her to continue with nortriptyline 50 mg each night. She has reportedly had some abnormalities in her liver enzymes. I would not like to push the nortriptyline dose any further. I suggested we add a small dose of the gabapentin at this time. She will take 100 mg each night. We also talked about potentially utilizing botulinum toxin injections for chronic intractable migraines in the future. She was given some information material on this. I provided a new prescription for gabapentin 100 mg each night. We talked about potential side effects of this medication. She is advised to follow-up in about 3 months and she can see [name], NP at the time and I will see her back after that. Thankfully, physical and neurological examination continued to be nonfocal.  I answered all her questions today and the patient was in agreement with the plan.  I spent 25 minutes in total face-to-face time with the patient, more than 50% of which was spent in counseling and coordination of care, reviewing test results, reviewing medication and discussing or reviewing the diagnosis of migraines, the prognosis and treatment options.

## 2016-10-30 NOTE — Patient Instructions (Signed)
Let's keep the nortriptyline at 50 mg each night.  In addition, I would like for you to start Neurontin (gabapentin) 100 mg strength: Take 1 pill nightly at bedtime. The most common side effects reported are sedation or sleepiness. Rare side effects include balance problems, confusion.  We have room to increase this.  Use Imitrex 25 mg generic as needed.  I provided information material on Botox injections for chronic migraines. Please read.  Follow up in about 3 months with Aundra MilletMegan, NP.

## 2017-01-24 DIAGNOSIS — G43909 Migraine, unspecified, not intractable, without status migrainosus: Secondary | ICD-10-CM | POA: Insufficient documentation

## 2017-01-26 ENCOUNTER — Encounter (HOSPITAL_COMMUNITY): Payer: Self-pay

## 2017-01-26 ENCOUNTER — Ambulatory Visit (HOSPITAL_COMMUNITY)
Admission: EM | Admit: 2017-01-26 | Discharge: 2017-01-26 | Disposition: A | Discharge status: Home / Self Care | Payer: Medicaid Other | Attending: Family Medicine | Admitting: Family Medicine

## 2017-01-26 DIAGNOSIS — R51 Headache: Secondary | ICD-10-CM

## 2017-01-26 DIAGNOSIS — R519 Headache, unspecified: Secondary | ICD-10-CM

## 2017-01-26 MED ORDER — DEXAMETHASONE SODIUM PHOSPHATE 10 MG/ML IJ SOLN
INTRAMUSCULAR | Status: AC
  Filled 2017-01-26: qty 1

## 2017-01-26 MED ORDER — KETOROLAC TROMETHAMINE 60 MG/2ML IM SOLN
INTRAMUSCULAR | Status: AC
  Filled 2017-01-26: qty 2

## 2017-01-26 MED ORDER — ONDANSETRON 4 MG PO TBDP
ORAL_TABLET | ORAL | Status: AC
  Filled 2017-01-26: qty 1

## 2017-01-26 MED ORDER — DEXAMETHASONE SODIUM PHOSPHATE 10 MG/ML IJ SOLN
10.0000 mg | Freq: Once | INTRAMUSCULAR | Status: AC
  Administered 2017-01-26: 10 mg via INTRAMUSCULAR

## 2017-01-26 MED ORDER — KETOROLAC TROMETHAMINE 60 MG/2ML IM SOLN
60.0000 mg | Freq: Once | INTRAMUSCULAR | Status: AC
  Administered 2017-01-26: 60 mg via INTRAMUSCULAR

## 2017-01-26 MED ORDER — ONDANSETRON 4 MG PO TBDP
4.0000 mg | ORAL_TABLET | Freq: Once | ORAL | Status: AC
  Administered 2017-01-26: 4 mg via ORAL

## 2017-01-26 MED ORDER — METOCLOPRAMIDE HCL 5 MG/ML IJ SOLN
INTRAMUSCULAR | Status: AC
  Filled 2017-01-26: qty 2

## 2017-01-26 MED ORDER — ONDANSETRON HCL 4 MG PO TABS: 4.0000 mg | ORAL_TABLET | Freq: Four times a day (QID) | ORAL | 0 refills | Status: DC

## 2017-01-26 MED ORDER — DIPHENHYDRAMINE HCL 50 MG/ML IJ SOLN
50.0000 mg | Freq: Once | INTRAMUSCULAR | Status: AC
  Administered 2017-01-26: 50 mg via INTRAMUSCULAR

## 2017-01-26 MED ORDER — DIPHENHYDRAMINE HCL 50 MG/ML IJ SOLN
INTRAMUSCULAR | Status: AC
  Filled 2017-01-26: qty 1

## 2017-01-26 MED ORDER — METOCLOPRAMIDE HCL 5 MG/ML IJ SOLN
5.0000 mg | Freq: Once | INTRAMUSCULAR | Status: AC
  Administered 2017-01-26: 5 mg via INTRAMUSCULAR

## 2017-01-26 NOTE — Discharge Instructions (Signed)
It appears you have a mixed type headache. Part of it is tension type headache and some symptoms are that of a migraine. Hopefully the medications were given here should help with your headache. Follow-up with your neurologist as scheduled this month. Take the Zofran for nausea or vomiting if needed. Continue your other medications.

## 2017-01-26 NOTE — ED Provider Notes (Signed)
CSN: [id number]     Arrival date & time 01/26/17  1729 History   First MD Initiated Contact with Patient 01/26/17 1856     Chief Complaint  Patient presents with  . Headache   (Consider location/radiation/quality/duration/timing/severity/associated sxs/prior Treatment) 21 year old female with a history of migraines for several years presents to the urgent care stating she has had a migraine headache for nearly one week. She states she is having weakness and nausea. The headache is constant but for some reason after eating she "feels bad". The headache is primarily frontal. She feels more pain when she wrinkles her frontal skin. Denies problems with vision, speech, hearing, swallowing, focal paresthesias or weakness, photophobia, vomiting. Complains of nausea. States this headache is typical of previous headaches but lasts longer and does not feel well in general without being more specific. No other areas of pain. Her ongoing preventative medication includes nortriptyline and gabapentin.      Past Medical History:  Diagnosis Date  . Constipation   . Hypertension   . Migraine   . Thyroid disease    Past Surgical History:  Procedure Laterality Date  . ESOPHAGOGASTRODUODENOSCOPY     Family History  Problem Relation Age of Onset  . Migraines Father   . Hypertension Father    Social History  Substance Use Topics  . Smoking status: Never Smoker  . Smokeless tobacco: Never Used  . Alcohol use No   OB History    No data available     Review of Systems  Constitutional: Positive for activity change. Negative for diaphoresis and fever.  HENT: Negative.   Eyes: Negative for photophobia and visual disturbance.  Respiratory: Negative.   Cardiovascular: Negative for chest pain and leg swelling.  Gastrointestinal: Positive for nausea. Negative for abdominal pain and vomiting.  Genitourinary: Negative.   Musculoskeletal: Negative.   Skin: Negative.   Neurological: Positive for  headaches. Negative for tremors, syncope, facial asymmetry, speech difficulty, weakness, light-headedness and numbness.  Psychiatric/Behavioral: Negative.     Allergies  Peanut-containing drug products and Penicillins  Home Medications   Prior to Admission medications   Medication Sig Start Date End Date Taking? Authorizing Provider  albuterol (PROVENTIL HFA;VENTOLIN HFA) 108 (90 Base) MCG/ACT inhaler Inhale 1-2 puffs into the lungs every 6 (six) hours as needed for wheezing. 10/09/16  Yes Rolland Porter, MD  cetirizine (ZYRTEC) 10 MG tablet One 10mg  tablet daily. 06/21/16  Yes Historical Provider, MD  gabapentin (NEURONTIN) 100 MG capsule Take 1 capsule (100 mg total) by mouth at bedtime. 10/30/16  Yes Huston Foley, MD  LINZESS 290 MCG CAPS capsule Take 290 mcg by mouth daily before breakfast.  06/05/16  Yes Historical Provider, MD  MONONESSA 0.25-35 MG-MCG tablet Take 1 tablet by mouth daily. 08/22/16  Yes Historical Provider, MD  nortriptyline (PAMELOR) 50 MG capsule Take 1 capsule (50 mg total) by mouth at bedtime. 10/30/16  Yes Huston Foley, MD  omeprazole (PRILOSEC) 40 MG capsule Take 40 mg by mouth daily.  06/21/16  Yes Historical Provider, MD  ondansetron (ZOFRAN ODT) 4 MG disintegrating tablet Take 1 tablet (4 mg total) by mouth every 8 (eight) hours as needed for nausea or vomiting. 09/14/15  Yes Deirdre Peer, MD  polyethylene glycol powder (GLYCOLAX/MIRALAX) powder Take 17 g by mouth 2 (two) times daily. Patient taking differently: Take 17 g by mouth daily as needed for mild constipation.  04/24/15  Yes Saverio Danker, MD  predniSONE (DELTASONE) 20 MG tablet Take 2 tablets (40 mg total) by  mouth daily. 02/16/16  Yes Gwyneth Sprout, MD  ondansetron (ZOFRAN) 4 MG tablet Take 1 tablet (4 mg total) by mouth every 6 (six) hours. 01/26/17   Hayden Rasmussen, NP  SUMAtriptan (IMITREX) 25 MG tablet Take 1 tablet (25 mg total) by mouth once. May repeat in 2 h prn, no more than 2 pills/24 h, no more than 4  pills/week. 10/30/16 10/30/16  Huston Foley, MD   Meds Ordered and Administered this Visit   Medications  ketorolac (TORADOL) injection 60 mg (not administered)  metoCLOPramide (REGLAN) injection 5 mg (not administered)  dexamethasone (DECADRON) injection 10 mg (not administered)  diphenhydrAMINE (BENADRYL) injection 50 mg (not administered)  ondansetron (ZOFRAN-ODT) disintegrating tablet 4 mg (not administered)    BP 114/74 (BP Location: Right Arm)   Pulse 81   Temp 99.2 F (37.3 C) (Oral)   Resp 20   Wt 202 lb (91.6 kg)   LMP 01/03/2017 (Within Days)   SpO2 100%   BMI 35.78 kg/m  No data found.   Physical Exam  Constitutional: She is oriented to person, place, and time. She appears well-developed and well-nourished. No distress.  HENT:  Head: Normocephalic and atraumatic.  Right Ear: External ear normal.  Left Ear: External ear normal.  Mouth/Throat: Oropharynx is clear and moist. No oropharyngeal exudate.  Soft palate rises symmetrically. Tongue and uvula midline. Speech is clear and articulate.  Eyes: Conjunctivae and EOM are normal. Pupils are equal, round, and reactive to light. Right eye exhibits no discharge. Left eye exhibits no discharge.  Neck: Normal range of motion. Neck supple.  Cardiovascular: Normal rate, regular rhythm, normal heart sounds and intact distal pulses.   Pulmonary/Chest: Effort normal and breath sounds normal. No respiratory distress. She has no wheezes.  Abdominal: Soft.  Musculoskeletal: Normal range of motion. She exhibits no edema or tenderness.  Lymphadenopathy:    She has no cervical adenopathy.  Neurological: She is alert and oriented to person, place, and time. She has normal strength. She displays no tremor. No cranial nerve deficit or sensory deficit. She exhibits normal muscle tone. Coordination and gait normal. GCS eye subscore is 4. GCS verbal subscore is 5. GCS motor subscore is 6.  Skin: Skin is warm and dry. Capillary refill takes  less than 2 seconds. No rash noted.  Psychiatric: She has a normal mood and affect. Her behavior is normal. Judgment and thought content normal.  Nursing note and vitals reviewed.   Urgent Care Course     Procedures (including critical care time)  Labs Review Labs Reviewed - No data to display  Imaging Review No results found.   Visual Acuity Review  Right Eye Distance:   Left Eye Distance:   Bilateral Distance:    Right Eye Near:   Left Eye Near:    Bilateral Near:         MDM   1. Mixed headache    It appears you have a mixed type headache. Part of it is tension type headache and some symptoms are that of a migraine. Hopefully the medications were given here should help with your headache. Follow-up with your neurologist as scheduled this month. Take the Zofran for nausea or vomiting if needed. Continue your other medications. Meds ordered this encounter  Medications  . ketorolac (TORADOL) injection 60 mg  . metoCLOPramide (REGLAN) injection 5 mg  . dexamethasone (DECADRON) injection 10 mg  . diphenhydrAMINE (BENADRYL) injection 50 mg  . ondansetron (ZOFRAN-ODT) disintegrating tablet 4 mg  . ondansetron (ZOFRAN)  4 MG tablet    Sig: Take 1 tablet (4 mg total) by mouth every 6 (six) hours.    Dispense:  12 tablet    Refill:  0    Order Specific Question:   Supervising Provider    Answer:   Linna HoffKINDL, JAMES D [[id number]]       Hayden Rasmussenavid Luisdavid Hamblin, NP 01/26/17 1924

## 2017-01-26 NOTE — ED Triage Notes (Signed)
Headache for 1 week. Has a hx of migraines, nausea when she eats and stomach feels tender. Hot/cold spells. No fever. Said no chance of pregnancy. No otc meds taken

## 2017-01-30 ENCOUNTER — Encounter: Payer: Self-pay | Admitting: Adult Health

## 2017-01-30 ENCOUNTER — Ambulatory Visit (INDEPENDENT_AMBULATORY_CARE_PROVIDER_SITE_OTHER): Payer: Medicaid Other | Admitting: Adult Health

## 2017-01-30 VITALS — BP 104/74 | HR 80 | Ht 63.0 in | Wt 202.0 lb

## 2017-01-30 DIAGNOSIS — G43011 Migraine without aura, intractable, with status migrainosus: Secondary | ICD-10-CM

## 2017-01-30 MED ORDER — RIZATRIPTAN BENZOATE 10 MG PO TBDP: 10.0000 mg | ORAL_TABLET | ORAL | 11 refills | Status: DC | PRN

## 2017-01-30 MED ORDER — GABAPENTIN 100 MG PO CAPS: ORAL_CAPSULE | ORAL | 5 refills | Status: DC

## 2017-01-30 MED ORDER — PREDNISONE 5 MG PO TABS: ORAL_TABLET | ORAL | 0 refills | Status: DC

## 2017-01-30 NOTE — Progress Notes (Addendum)
PATIENT: Nichole Bush DOB: [birth year]  REASON FOR VISIT: follow up- migraine headaches HISTORY FROM: patient  HISTORY OF PRESENT ILLNESS: Ms. Nichole Bush is a 21 year old female with a history of migraine headaches. She returns today for an evaluation. She is currently on nortriptyline 50 mg at bedtime. At her last visit gabapentin was added 100 mg at bedtime. She states that she is continues to  have daily headaches. She states that in a month's time she may have 3 headache free day. Her headaches typically occur across the forehead or in the back of the head. She describes her headaches as a "stabbing pain". She does have photophobia and phonophobia as well as nausea and vomiting. She states that the only way she gets any relief is to go to sleep. She reports sumatriptan is not working. The patient never had the MRI of the brain. This past weekend her headache was so severe that she went to urgent care and had "migraine cocktail." She states that this was beneficial for about 1-1/2 days. She states that with severe headaches she may develop blurry vision 1 hour after the headache starts. She did see her ophthalmologist approximately 6 months ago. She reports that she was not told of any optic disc swelling. She returns today for an evaluation.  HISTORY per Dr. Guadelupe Sabin notes: Nichole Bush is an 21 year old right-handed woman with an underlying medical history of asthma, allergies, and obesity, who presents for follow-up consultation of her recurrent migraines. The patient is unaccompanied today. I last saw her on 04/20/2016, at which time she reported doing a little better, had an updated eye exam in any prescription for her eyeglasses. She was taking nortriptyline 20 mg at night and was tolerating it. She felt it helped. In the interim, she presented in the past 6 months 3 times to the emergency room for different issues including chest pain, abdominal pain, and dizziness.  Today, 10/30/2016: She reports  doing okay, has been able to continue with nortriptyline but had to come off of all Tylenol products because of liver enzymes coming back abnormal twice. She was seen in the interim by [name], NP on 08/21/2016 at which time the nortriptyline was increased to 50 mg each night. She ran out of her prescription for sumatriptan 25 mg tablet. an MRI was ordered but was not done. Liver enzymes were normal on 09/13/16.  Of note, as a preteen, she tried topamax, which she recalls, did not work. Sumatriptan helps, but makes her somewhat sleepy.  She has not been on Depakote. No Inderal. Saw a GI specialist at [facility], is supposed to see a liver specialist from what I understand. Still has about 3 headaches per week. They can last up to 2 days at a time. She has practically eliminated all sodas and tea.  Previously:  I first met her on 03/02/2016 at the request of her primary care physician, at which time she reported a long-standing history of migraine headaches since childhood. I suggested we proceed with a sleep study as she also reported some symptoms concerning for underlying obstructive sleep apnea. For preventative medication for migraines I suggested a trial of nortriptyline, starting at 10 mg with gradual titration. She had a baseline sleep study on 03/26/2016. Sleep efficiency was 91.3% with a normal sleep latency of 22 minutes and wake after sleep onset of 20 minutes with mild sleep fragmentation noted. She had a normal arousal index. She had an increased percentage of stage II sleep, a normal  percentage of slow-wave sleep and a decreased percentage of REM sleep at 11.9% with a prolonged REM latency of 232 minutes. She had mild PLMS 9.7 per hour resulting in 4.1 arousals per hour. She had mild to moderate snoring. Total AHI was 2.3 per hour. Average oxygen saturation was 97%, nadir was 89%.  03/02/2016: She reports a longer standing history of migraine headaches. She started having  headaches when she was 21 years old. She was getting a headache about 3 times a week but in the last 4 weeks she has had a daily headache. She reports a left-sided pounding headache typically, associated with nausea, occasional vomiting, and photophobia, typically no aura described but occasional blurry vision reported. She has a family history of migraines in her father. Her parents are separated. She lived with her father for some time in [place]. He still lives there, mother lives in [place]. She has 3 siblings on father's side, 4 siblings on mother's side. She has gained weight. She snores, at times loudly. Boyfriend has noted witnessed apneic pauses while she is asleep. Sleep is disrupted, she has nocturia about twice a night. She often wakes up with a headache. She drinks a lot of caffeine in the form of soda, usually at 2 L bottle of Pepsi per day and iced coffee, one large per day, she does not remember the last time she drank water. She lives alone. She has been living alone for the past 3 months. She works at [employer]. She is a nonsmoker denies using any illicit drugs. She does not drink alcohol. Bedtime is around 11 PM, wakeup time is around 6 AM as she takes her boyfriend to work. She denies any one-sided weakness or numbness. She has a large hairdo, but says, that she only recently had her hairstyle changed and it did not affect her headaches. I reviewed your office note from 02/25/2016, which you kindly included. She was given a prescription for Phenergan for nausea, and Imitrex 25 mg strength 1-2 pills as needed for migraine. She has tried the medications but feels she is too sleepy from them. She has tried over-the-counter medications including Excedrin Migraine and Tylenol PM, with no significant improvement.  She does not report being on prescription medication as a child for migraines. There is no family history of long QT, heart disease, and she has no personal or family history of  lupus, or sickle cell trait or sickle cell disease.   REVIEW OF SYSTEMS: Out of a complete 14 system review of symptoms, the patient complains only of the following symptoms, and all other reviewed systems are negative.  Nausea, vomiting, insomnia, daytime sleepiness, snoring, back pain, weakness, dizziness, headache, light sensitivity, loss of vision, activity change  ALLERGIES: Allergies  Allergen Reactions  . Peanut-Containing Drug Products Anaphylaxis  . Penicillins Anaphylaxis, Hives and Other (See Comments)        HOME MEDICATIONS: Outpatient Medications Prior to Visit  Medication Sig Dispense Refill  . albuterol (PROVENTIL HFA;VENTOLIN HFA) 108 (90 Base) MCG/ACT inhaler Inhale 1-2 puffs into the lungs every 6 (six) hours as needed for wheezing. 1 Inhaler 0  . cetirizine (ZYRTEC) 10 MG tablet One 45m tablet daily.  0  . gabapentin (NEURONTIN) 100 MG capsule Take 1 capsule (100 mg total) by mouth at bedtime. 30 capsule 5  . LINZESS 290 MCG CAPS capsule Take 290 mcg by mouth daily before breakfast.   11  . MONONESSA 0.25-35 MG-MCG tablet Take 1 tablet by mouth daily.  3  . nortriptyline (PAMELOR) 50 MG capsule Take 1 capsule (50 mg total) by mouth at bedtime. 30 capsule 5  . omeprazole (PRILOSEC) 40 MG capsule Take 40 mg by mouth daily.   9  . ondansetron (ZOFRAN ODT) 4 MG disintegrating tablet Take 1 tablet (4 mg total) by mouth every 8 (eight) hours as needed for nausea or vomiting. 20 tablet 0  . polyethylene glycol powder (GLYCOLAX/MIRALAX) powder Take 17 g by mouth 2 (two) times daily. (Patient taking differently: Take 17 g by mouth daily as needed for mild constipation. ) 850 g 2  . predniSONE (DELTASONE) 20 MG tablet Take 2 tablets (40 mg total) by mouth daily. 10 tablet 0  . SUMAtriptan (IMITREX) 25 MG tablet Take 1 tablet (25 mg total) by mouth once. May repeat in 2 h prn, no more than 2 pills/24 h, no more than 4 pills/week. 10 tablet 5  . ondansetron (ZOFRAN) 4 MG tablet  Take 1 tablet (4 mg total) by mouth every 6 (six) hours. (Patient not taking: Reported on 01/30/2017) 12 tablet 0   No facility-administered medications prior to visit.     PAST MEDICAL HISTORY: Past Medical History:  Diagnosis Date  . Constipation   . Hypertension   . Migraine   . Thyroid disease     PAST SURGICAL HISTORY: Past Surgical History:  Procedure Laterality Date  . ESOPHAGOGASTRODUODENOSCOPY      FAMILY HISTORY: Family History  Problem Relation Age of Onset  . Migraines Father   . Hypertension Father     SOCIAL HISTORY: Social History   Social History  . Marital status: Single    Spouse name: N/A  . Number of children: N/A  . Years of education: HS   Occupational History  . [employer]    Social History Main Topics  . Smoking status: Never Smoker  . Smokeless tobacco: Never Used  . Alcohol use No  . Drug use: No  . Sexual activity: Not on file   Other Topics Concern  . Not on file   Social History Narrative   Lives at home w/ her mom   Left-handed   Caffeine: occasional tea, has stopped drinking sodas x2 months (10/30/2016)         PHYSICAL EXAM  Vitals:   01/30/17 0847  BP: 104/74  Pulse: 80  Weight: 202 lb (91.6 kg)  Height: 5' 3"  (1.6 m)   Body mass index is 35.78 kg/m.  Generalized: Well developed, in no acute distress   Neurological examination  Mentation: Alert oriented to time, place, history taking. Follows all commands speech and language fluent Cranial nerve II-XII: Funduscopic exam: Hard to visualize optic disks. Pupils were equal round reactive to light. Extraocular movements were full, visual field were full on confrontational test. Facial sensation and strength were normal. Uvula tongue midline. Head turning and shoulder shrug  were normal and symmetric. Motor: The motor testing reveals 5 over 5 strength of all 4 extremities. Good symmetric motor tone is noted throughout.  Sensory: Sensory testing is intact to soft touch  on all 4 extremities. No evidence of extinction is noted.  Coordination: Cerebellar testing reveals good finger-nose-finger and heel-to-shin bilaterally.  Gait and station: Gait is normal. Tandem gait is normal. Romberg is negative. No drift is seen.  Reflexes: Deep tendon reflexes are symmetric and normal bilaterally.   DIAGNOSTIC DATA (LABS, IMAGING, TESTING) - I reviewed patient records, labs, notes, testing and imaging myself where available.  Lab Results  Component Value  Date   WBC 6.2 09/13/2016   HGB 12.2 09/13/2016   HCT 36.1 09/13/2016   MCV 88.0 09/13/2016   PLT 330 09/13/2016      Component Value Date/Time   NA 138 09/13/2016 1303   K 3.5 09/13/2016 1303   CL 108 09/13/2016 1303   CO2 24 09/13/2016 1303   GLUCOSE 110 (H) 09/13/2016 1303   BUN 5 (L) 09/13/2016 1303   CREATININE 0.60 09/13/2016 1303   CALCIUM 9.7 09/13/2016 1303   PROT 7.6 09/13/2016 1303   ALBUMIN 3.9 09/13/2016 1303   AST 40 09/13/2016 1303   ALT 51 09/13/2016 1303   ALKPHOS 68 09/13/2016 1303   BILITOT 0.7 09/13/2016 1303   GFRNONAA >60 09/13/2016 1303   GFRAA >60 09/13/2016 1303    Lab Results  Component Value Date   TSH 5.736 (H) 04/22/2015      ASSESSMENT AND PLAN 21 y.o. year old female  has a past medical history of Constipation; Hypertension; Migraine; and Thyroid disease. here with:  1. Migraine headache  The patient will continue on nortriptyline 50 mg at bedtime. Patient reports that sumatriptan is not working. She will discontinue this- we will try Maxalt 10 mg to take at the onset of her migraine she can repeat in 2 hours if needed. I will also give the patient a prednisone Dosepak to help with her current migraine. I reviewed the side effects of this medication with the patient. We will also increase gabapentin to 200 mg at bedtime for 2 weeks. After 2 weeks if her headache frequency has not improved she will increase to 300 mg at bedtime. The patient may be a candidate for  Botox therapy however I would like for her to have MRI of the brain first. Patient is amenable to this plan. She will follow-up in 4-5 months with Dr. Rexene Alberts  I spent 25 minutes with the patient 50% of this time was spent counseling the patient on her medication changes and ongoing treatment plan.   [name], MSN, NP-C 01/30/2017, 9:07 AM [facility] [address] [place], [place] [address] [phone]  I reviewed the above note and documentation by the Nurse Practitioner and agree with the history, physical exam, assessment and plan as outlined above. I was immediately available for face-to-face consultation. Star Age, MD, PhD [facility])

## 2017-01-30 NOTE — Patient Instructions (Addendum)
Continue Nortriptyline 50 mg at bedtime Increase Gabapentin to 200 mg at bedtime for two weeks if headaches do not improve increase to 300 mg at bedtime Try maxalt 10 mg as soon as the headache starts- may repeat in two hours if needed. Not to exceed 2 tablets in 24 hours Prednisone dosepak for current migraine. Have your eye doctor fax over their reports. Call [facility] to schedule MRI brain- [phone]  Rizatriptan tablets What is this medicine? RIZATRIPTAN (rye za TRIP tan) is used to treat migraines with or without aura. An aura is a strange feeling or visual disturbance that warns you of an attack. It is not used to prevent migraines. This medicine may be used for other purposes; ask your health care provider or pharmacist if you have questions. COMMON BRAND NAME(S): Maxalt What should I tell my health care provider before I take this medicine? They need to know if you have any of these conditions: -bowel disease or colitis -diabetes -family history of heart disease -fast or irregular heart beat -heart or blood vessel disease, angina (chest pain), or previous heart attack -high blood pressure -high cholesterol -history of stroke, transient ischemic attacks (TIAs or mini-strokes), or intracranial bleeding -kidney or liver disease -overweight -poor circulation -postmenopausal or surgical removal of uterus and ovaries -Raynaud's disease -seizure disorder -an unusual or allergic reaction to rizatriptan, other medicines, foods, dyes, or preservatives -pregnant or trying to get pregnant -breast-feeding How should I use this medicine? This medicine is taken by mouth with a glass of water. Follow the directions on the prescription label. This medicine is taken at the first symptoms of a migraine. It is not for everyday use. If your migraine headache returns after one dose, you can take another dose as directed. You must leave at least 2 hours between doses, and do not take  more than 30 mg total in 24 hours. If there is no improvement at all after the first dose, do not take a second dose without talking to your doctor or health care professional. Do not take your medicine more often than directed. Talk to your pediatrician regarding the use of this medicine in children. While this drug may be prescribed for children as young as 6 years for selected conditions, precautions do apply. Overdosage: If you think you have taken too much of this medicine contact a poison control center or emergency room at once. NOTE: This medicine is only for you. Do not share this medicine with others. What if I miss a dose? This does not apply; this medicine is not for regular use. What may interact with this medicine? Do not take this medicine with any of the following medicines: -amphetamine, dextroamphetamine or cocaine -dihydroergotamine, ergotamine, ergoloid mesylates, methysergide, or ergot-type medication - do not take within 24 hours of taking rizatriptan -feverfew -MAOIs like Carbex, Eldepryl, Marplan, Nardil, and Parnate - do not take rizatriptan within 2 weeks of stopping MAOI therapy. -other migraine medicines like almotriptan, eletriptan, naratriptan, sumatriptan, zolmitriptan - do not take within 24 hours of taking rizatriptan -tryptophan This medicine may also interact with the following medications: -medicines for mental depression, anxiety or mood problems -propranolol This list may not describe all possible interactions. Give your health care provider a list of all the medicines, herbs, non-prescription drugs, or dietary supplements you use. Also tell them if you smoke, drink alcohol, or use illegal drugs. Some items may interact with your medicine. What should I watch for while using this medicine? Only take this medicine  for a migraine headache. Take it if you get warning symptoms or at the start of a migraine attack. It is not for regular use to prevent migraine  attacks. You may get drowsy or dizzy. Do not drive, use machinery, or do anything that needs mental alertness until you know how this medicine affects you. To reduce dizzy or fainting spells, do not sit or stand up quickly, especially if you are an older patient. Alcohol can increase drowsiness, dizziness and flushing. Avoid alcoholic drinks. Smoking cigarettes may increase the risk of heart-related side effects from using this medicine. If you take migraine medicines for 10 or more days a month, your migraines may get worse. Keep a diary of headache days and medicine use. Contact your healthcare professional if your migraine attacks occur more frequently. What side effects may I notice from receiving this medicine? Side effects that you should report to your doctor or health care professional as soon as possible: -allergic reactions like skin rash, itching or hives, swelling of the face, lips, or tongue -fast, slow, or irregular heart beat -increased or decreased blood pressure -seizures -severe stomach pain and cramping, bloody diarrhea -signs and symptoms of a blood clot such as breathing problems; changes in vision; chest pain; severe, sudden headache; pain, swelling, warmth in the leg; trouble speaking; sudden numbness or weakness of the face, arm or leg -tingling, pain, or numbness in the face, hands, or feet Side effects that usually do not require medical attention (report to your doctor or health care professional if they continue or are bothersome): -drowsiness -dry mouth -feeling warm, flushing, or redness of the face -headache -muscle cramps, pain -nausea, vomiting -unusually weak or tired This list may not describe all possible side effects. Call your doctor for medical advice about side effects. You may report side effects to FDA at 1-800-FDA-1088. Where should I keep my medicine? Keep out of the reach of children. Store at room temperature between 15 and 30 degrees C (59 and 86  degrees F). Keep container tightly closed. Throw away any unused medicine after the expiration date. NOTE: This sheet is a summary. It may not cover all possible information. If you have questions about this medicine, talk to your doctor, pharmacist, or health care provider.  2018 Elsevier/Gold Standard (2013-07-22 10:16:39) Prednisone tablets What is this medicine? PREDNISONE (PRED ni sone) is a corticosteroid. It is commonly used to treat inflammation of the skin, joints, lungs, and other organs. Common conditions treated include asthma, allergies, and arthritis. It is also used for other conditions, such as blood disorders and diseases of the adrenal glands. This medicine may be used for other purposes; ask your health care provider or pharmacist if you have questions. COMMON BRAND NAME(S): Deltasone, Predone, Sterapred, Sterapred DS What should I tell my health care provider before I take this medicine? They need to know if you have any of these conditions: -Cushing's syndrome -diabetes -glaucoma -heart disease -high blood pressure -infection (especially a virus infection such as chickenpox, cold sores, or herpes) -kidney disease -liver disease -mental illness -myasthenia gravis -osteoporosis -seizures -stomach or intestine problems -thyroid disease -an unusual or allergic reaction to lactose, prednisone, other medicines, foods, dyes, or preservatives -pregnant or trying to get pregnant -breast-feeding How should I use this medicine? Take this medicine by mouth with a glass of water. Follow the directions on the prescription label. Take this medicine with food. If you are taking this medicine once a day, take it in the morning. Do not take  more medicine than you are told to take. Do not suddenly stop taking your medicine because you may develop a severe reaction. Your doctor will tell you how much medicine to take. If your doctor wants you to stop the medicine, the dose may be slowly  lowered over time to avoid any side effects. Talk to your pediatrician regarding the use of this medicine in children. Special care may be needed. Overdosage: If you think you have taken too much of this medicine contact a poison control center or emergency room at once. NOTE: This medicine is only for you. Do not share this medicine with others. What if I miss a dose? If you miss a dose, take it as soon as you can. If it is almost time for your next dose, talk to your doctor or health care professional. You may need to miss a dose or take an extra dose. Do not take double or extra doses without advice. What may interact with this medicine? Do not take this medicine with any of the following medications: -metyrapone -mifepristone This medicine may also interact with the following medications: -aminoglutethimide -amphotericin B -aspirin and aspirin-like medicines -barbiturates -certain medicines for diabetes, like glipizide or glyburide -cholestyramine -cholinesterase inhibitors -cyclosporine -digoxin -diuretics -ephedrine -female hormones, like estrogens and birth control pills -isoniazid -ketoconazole -NSAIDS, medicines for pain and inflammation, like ibuprofen or naproxen -phenytoin -rifampin -toxoids -vaccines -warfarin This list may not describe all possible interactions. Give your health care provider a list of all the medicines, herbs, non-prescription drugs, or dietary supplements you use. Also tell them if you smoke, drink alcohol, or use illegal drugs. Some items may interact with your medicine. What should I watch for while using this medicine? Visit your doctor or health care professional for regular checks on your progress. If you are taking this medicine over a prolonged period, carry an identification card with your name and address, the type and dose of your medicine, and your doctor's name and address. This medicine may increase your risk of getting an infection.  Tell your doctor or health care professional if you are around anyone with measles or chickenpox, or if you develop sores or blisters that do not heal properly. If you are going to have surgery, tell your doctor or health care professional that you have taken this medicine within the last twelve months. Ask your doctor or health care professional about your diet. You may need to lower the amount of salt you eat. This medicine may affect blood sugar levels. If you have diabetes, check with your doctor or health care professional before you change your diet or the dose of your diabetic medicine. What side effects may I notice from receiving this medicine? Side effects that you should report to your doctor or health care professional as soon as possible: -allergic reactions like skin rash, itching or hives, swelling of the face, lips, or tongue -changes in emotions or moods -changes in vision -depressed mood -eye pain -fever or chills, cough, sore throat, pain or difficulty passing urine -increased thirst -swelling of ankles, feet Side effects that usually do not require medical attention (report to your doctor or health care professional if they continue or are bothersome): -confusion, excitement, restlessness -headache -nausea, vomiting -skin problems, acne, thin and shiny skin -trouble sleeping -weight gain This list may not describe all possible side effects. Call your doctor for medical advice about side effects. You may report side effects to FDA at 1-800-FDA-1088. Where should I keep my medicine?  Keep out of the reach of children. Store at room temperature between 15 and 30 degrees C (59 and 86 degrees F). Protect from light. Keep container tightly closed. Throw away any unused medicine after the expiration date. NOTE: This sheet is a summary. It may not cover all possible information. If you have questions about this medicine, talk to your doctor, pharmacist, or health care provider.   2018 Elsevier/Gold Standard (2011-07-06 10:57:14)

## 2017-02-01 ENCOUNTER — Other Ambulatory Visit: Payer: Self-pay | Admitting: *Deleted

## 2017-02-01 MED ORDER — RIZATRIPTAN BENZOATE 10 MG PO TBDP: 10.0000 mg | ORAL_TABLET | ORAL | 11 refills | Status: DC | PRN

## 2017-02-01 NOTE — Telephone Encounter (Signed)
Spoke to [place] at [facility]. REF #  [id number].  Rizatriptan odt on preferred list.  Maximum 12 tabs in 30 day.  [facility], (did go thru at 12 tabs). No PA needed.  Changed in med list.

## 2017-03-06 ENCOUNTER — Other Ambulatory Visit: Payer: Self-pay | Admitting: Neurology

## 2017-03-06 ENCOUNTER — Telehealth: Payer: Self-pay | Admitting: Adult Health

## 2017-03-06 DIAGNOSIS — G43009 Migraine without aura, not intractable, without status migrainosus: Secondary | ICD-10-CM

## 2017-03-06 DIAGNOSIS — G43011 Migraine without aura, intractable, with status migrainosus: Secondary | ICD-10-CM

## 2017-03-06 NOTE — Telephone Encounter (Signed)
Pt called said the HA's are getting worse in intensity. Pt did not want to gi into more detail

## 2017-03-06 NOTE — Telephone Encounter (Signed)
I called pt. She reports that she is still waking up with a headache every morning and the intensity of the headaches are increasing. Pt says that she does not think her headaches are diet related because "I eat different things every day." Pt is only taking gabapentin  at night. I asked her to go ahead and increase to 300 mg qhs per Aundra Millet, NP's instructions. Pt also has not completed her MRI. I gave her [facility] number to call and schedule the MRI. Pt reports that she lost this number. I will also send this message to our MRI scheduler. Pt wants to know what else Aundra Millet recommends for headaches. She has heard of a natural remedy "CBD" that she wants to know if Aundra Millet has heard about or recommends. I advised her that I did not know of that natural remedy.

## 2017-03-07 NOTE — Telephone Encounter (Signed)
I called pt. I advised her that Aundra Millet, NP has not heard of CBD, but does agree with the gabapentin 300 mg qhs and that pt needs to schedule her MRI. Pt says that she will call [facility] to schedule her MRI. Pt verbalized understanding of these recommendations.

## 2017-03-07 NOTE — Telephone Encounter (Signed)
I agree with increasing Gabapentin to 300 mg at bedtime. Schedule MRI. I have never heard of CBD.

## 2017-03-07 NOTE — Telephone Encounter (Signed)
I refaxed the order to [facility].

## 2017-03-08 NOTE — Telephone Encounter (Signed)
Nichole Bush is scheduled to have her MRI for Thursday 03/22/17.

## 2017-03-22 ENCOUNTER — Ambulatory Visit
Admission: RE | Admit: 2017-03-22 | Discharge: 2017-03-22 | Disposition: A | Discharge status: Home / Self Care | Payer: Medicaid Other | Source: Ambulatory Visit | Attending: Adult Health | Admitting: Adult Health

## 2017-03-22 DIAGNOSIS — R519 Headache, unspecified: Secondary | ICD-10-CM

## 2017-03-22 DIAGNOSIS — R51 Headache: Secondary | ICD-10-CM | POA: Diagnosis not present

## 2017-03-22 DIAGNOSIS — G43011 Migraine without aura, intractable, with status migrainosus: Secondary | ICD-10-CM | POA: Diagnosis not present

## 2017-03-22 MED ORDER — GADOBENATE DIMEGLUMINE 529 MG/ML IV SOLN
19.0000 mL | Freq: Once | INTRAVENOUS | Status: AC | PRN
  Administered 2017-03-22: 19 mL via INTRAVENOUS

## 2017-03-26 ENCOUNTER — Telehealth: Payer: Self-pay

## 2017-03-26 NOTE — Telephone Encounter (Signed)
LM with results and recommendations below. Left call back number for any further questions.  

## 2017-03-26 NOTE — Telephone Encounter (Signed)
-----   Message from Butch Penny, NP sent at 03/26/2017  1:22 PM EDT ----- MRI unremarkable with the exception of acute sphenoid sinusitis and minimal chronic ethmoid sinusitis. Please call patient with results.

## 2017-04-07 ENCOUNTER — Other Ambulatory Visit: Payer: Self-pay | Admitting: Neurology

## 2017-04-07 DIAGNOSIS — E669 Obesity, unspecified: Secondary | ICD-10-CM

## 2017-04-07 DIAGNOSIS — Z789 Other specified health status: Secondary | ICD-10-CM

## 2017-04-07 DIAGNOSIS — R0681 Apnea, not elsewhere classified: Secondary | ICD-10-CM

## 2017-04-07 DIAGNOSIS — R351 Nocturia: Secondary | ICD-10-CM

## 2017-04-07 DIAGNOSIS — R51 Headache: Secondary | ICD-10-CM

## 2017-04-07 DIAGNOSIS — G43011 Migraine without aura, intractable, with status migrainosus: Secondary | ICD-10-CM

## 2017-04-07 DIAGNOSIS — G4719 Other hypersomnia: Secondary | ICD-10-CM

## 2017-04-07 DIAGNOSIS — R0683 Snoring: Secondary | ICD-10-CM

## 2017-04-07 DIAGNOSIS — R519 Headache, unspecified: Secondary | ICD-10-CM

## 2017-04-09 ENCOUNTER — Telehealth: Payer: Self-pay

## 2017-04-09 DIAGNOSIS — G43009 Migraine without aura, not intractable, without status migrainosus: Secondary | ICD-10-CM

## 2017-04-09 DIAGNOSIS — G43011 Migraine without aura, intractable, with status migrainosus: Secondary | ICD-10-CM

## 2017-04-09 MED ORDER — NORTRIPTYLINE HCL 50 MG PO CAPS: 50.0000 mg | ORAL_CAPSULE | Freq: Every day | ORAL | 5 refills | Status: DC

## 2017-04-09 NOTE — Telephone Encounter (Signed)
Refill request received and sent in.

## 2017-04-15 ENCOUNTER — Other Ambulatory Visit: Payer: Self-pay | Admitting: Adult Health

## 2017-04-15 DIAGNOSIS — G43011 Migraine without aura, intractable, with status migrainosus: Secondary | ICD-10-CM

## 2017-04-24 ENCOUNTER — Telehealth: Payer: Self-pay | Admitting: Neurology

## 2017-04-24 NOTE — Telephone Encounter (Signed)
I spoke to patient and was able to move her to next week since there was a cancellation.

## 2017-04-24 NOTE — Telephone Encounter (Signed)
Spoke to pt today to r/s 6/27 apt due to Dr. Frances FurbishAthar being out, she said she would like to come sooner because the medication really isn't working and her headaches are getting worse. Do you have another spot we could use to get her in sooner?

## 2017-05-02 ENCOUNTER — Ambulatory Visit: Payer: Self-pay | Admitting: Neurology

## 2017-05-02 NOTE — Telephone Encounter (Signed)
Pt called said her car broke down and could not come to her "2:00" appt. I told her she missed her appt at 1:00 today. Pt said she never scheduled an appt at 1:00, She is in school at that time and she would never schedule an appt at 1:00. I advised her she scheduled this with Dr Teofilo PodAthar's RN Lafonda Mossesiana. She said I never scheduled an appt at 1:00. Pt had to abruptly get off the phone at that time and the call ended.

## 2017-05-02 NOTE — Telephone Encounter (Signed)
I spoke to patient to r/s her with Dr. Frances FurbishAthar or Aundra MilletMegan NP. No openings anytime soon that she could take. I advised her that I will call her back when there is a cancellation. Patient voiced understanding.

## 2017-05-03 ENCOUNTER — Telehealth: Payer: Self-pay

## 2017-05-03 ENCOUNTER — Encounter: Payer: Self-pay | Admitting: Neurology

## 2017-05-03 NOTE — Telephone Encounter (Signed)
Patient missed her appointment this week due to being in class. Patient requests to come in sooner for an appt, reports that her medication is not helping her headaches.  She cannot come until after 1:20pm any day and can see Dr. Frances FurbishAthar or Aundra MilletMegan NP.

## 2017-05-07 NOTE — Telephone Encounter (Signed)
I called pt and offered her an appt on 05/10/2017 at 2:00pm with Dr. Frances FurbishAthar. Pt is agreeable to this appt and verbalized understanding of new appt date and time.

## 2017-05-10 ENCOUNTER — Ambulatory Visit (INDEPENDENT_AMBULATORY_CARE_PROVIDER_SITE_OTHER): Payer: Medicaid Other | Admitting: Neurology

## 2017-05-10 ENCOUNTER — Encounter: Payer: Self-pay | Admitting: Neurology

## 2017-05-10 VITALS — BP 129/78 | HR 87 | Ht 63.0 in | Wt 206.0 lb

## 2017-05-10 DIAGNOSIS — G43019 Migraine without aura, intractable, without status migrainosus: Secondary | ICD-10-CM | POA: Diagnosis not present

## 2017-05-10 MED ORDER — NORTRIPTYLINE HCL 10 MG PO CAPS: ORAL_CAPSULE | ORAL | 0 refills | Status: DC

## 2017-05-10 MED ORDER — TOPIRAMATE 50 MG PO TABS: ORAL_TABLET | ORAL | 5 refills | Status: DC

## 2017-05-10 NOTE — Progress Notes (Signed)
Subjective:    Patient ID: Nichole Bush is a 21 y.o. female.  HPI     Interim history:   Nichole Bush is an 21 year old right-handed woman with an underlying medical history of asthma, allergies, and obesity, who presents for follow-up consultation of her recurrent migraines. The patient is unaccompanied today. I last saw her on 10/30/2016, at which time she reported doing okay on nortriptyline. She reported abnormal liver enzymes twice and she had discontinued all Tylenol products. She was seen in the interim by [name], NP on 08/21/2016 at which time the nortriptyline was increased to 50 mg each night. She ran out of her prescription for sumatriptan 25 mg tablet. An MRI brain was ordered but was not done. Liver enzymes were normal on 09/13/16.  Of note, as a preteen, she may have tried topamax, which she recalls, did not work. Sumatriptan helps, but makes her somewhat sleepy.  She has not been on Depakote or Inderal. Saw a GI specialist at [facility], and was supposed to see a liver specialist. She reported having eliminated all sodas and tea.   She saw Nichole Bush in the interim on 01/30/2017, at which time she reported that the Imitrex was no longer working. She was given a prescription for Maxalt and her gabapentin was increased to 200 mg and possibly 300 mg if needed at night. She was also kept on nortriptyline. A brain MRI was ordered.  She had a brain MRI with and without contrast on 03/22/2017 which I reviewed: IMPRESSION:  This MRI of the brain with and without contrast shows the following: 1.   Brain parenchyma appears normal before and after contrast administration. 2.   Acute sphenoid sinusitis and minimal chronic ethmoid sinusitis.     Today, 05/10/2017: She reports frequent migraine headaches, nearly daily at this point. She is incontinent her school. 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 She is going to meet with an investor's in [place]  soon. She is engaged to be married. She may get married in [place]. She is excited about her half sister visiting over the summer.she has occasional associated nausea but is not actually taking nausea medicine at this time. She feels that the Maxalt has been a little better than Imitrex.She does not feel that the gabapentin or the nortriptyline or helpful at this time. She is not on an oral birth control pill a time despite what is listed in her meds. For the past 2 months she has had a IUD, Mirena. She does not actually recall trying Topamax before. She would be willing to try it. She was given information about Botox injections by Nichole Bush last time and is potentially interested in this but is worried about not having insurance coverage for this as she may lose Medicaid coverage when she turns 21. She does not want to start something new and not be able to continue.  The patient's allergies, current medications, family history, past medical history, past social history, past surgical history and problem list were reviewed and updated as appropriate.   Previously (copied from previous notes for reference):   I saw her on 04/20/2016, at which time she reported doing a little better, had an updated eye exam in any prescription for her eyeglasses. She was taking nortriptyline 20 mg at night and was tolerating it. She felt it helped. In the interim, she presented in the past 6 months 3 times to the emergency room for different issues including  chest pain, abdominal pain, and dizziness.   I first met her on 03/02/2016 at the request of her primary care physician, at which time she reported a long-standing history of migraine headaches since childhood. I suggested we proceed with a sleep study as she also reported some symptoms concerning for underlying obstructive sleep apnea. For preventative medication for migraines I suggested a trial of nortriptyline, starting at 10 mg with gradual titration. She had a  baseline sleep study on 03/26/2016. Sleep efficiency was 91.3% with a normal sleep latency of 22 minutes and wake after sleep onset of 20 minutes with mild sleep fragmentation noted. She had a normal arousal index. She had an increased percentage of stage II sleep, a normal percentage of slow-wave sleep and a decreased percentage of REM sleep at 11.9% with a prolonged REM latency of 232 minutes. She had mild PLMS 9.7 per hour resulting in 4.1 arousals per hour. She had mild to moderate snoring. Total AHI was 2.3 per hour. Average oxygen saturation was 97%, nadir was 89%.   03/02/2016: She reports a longer standing history of migraine headaches. She started having headaches when she was 21 years old. She was getting a headache about 3 times a week but in the last 4 weeks she has had a daily headache. She reports a left-sided pounding headache typically, associated with nausea, occasional vomiting, and photophobia, typically no aura described but occasional blurry vision reported. She has a family history of migraines in her father. Her parents are separated. She lived with her father for some time in [place]. He still lives there, mother lives in [place]. She has 3 siblings on father's side, 4 siblings on mother's side. She has gained weight. She snores, at times loudly. Boyfriend has noted witnessed apneic pauses while she is asleep. Sleep is disrupted, she has nocturia about twice a night. She often wakes up with a headache. She drinks a lot of caffeine in the form of soda, usually at 2 L bottle of Pepsi per day and iced coffee, one large per day, she does not remember the last time she drank water. She lives alone. She has been living alone for the past 3 months. She works at [employer] so. She is a nonsmoker denies using any illicit drugs. She does not drink alcohol. Bedtime is around 11 PM, wakeup time is around 6 AM as she takes her boyfriend to work. She denies any one-sided weakness or numbness. She has  a large hairdo, but says, that she only recently had her hairstyle changed and it did not affect her headaches. I reviewed your office note from 02/25/2016, which you kindly included. She was given a prescription for Phenergan for nausea, and Imitrex 25 mg strength 1-2 pills as needed for migraine. She has tried the medications but feels she is too sleepy from them. She has tried over-the-counter medications including Excedrin Migraine and Tylenol PM, with no significant improvement.   She does not report being on prescription medication as a child for migraines. There is no family history of long QT, heart disease, and she has no personal or family history of lupus, or sickle cell trait or sickle cell disease.   Her Past Medical History Is Significant For: Past Medical History:  Diagnosis Date  . Constipation   . Hypertension   . Migraine   . Thyroid disease     Her Past Surgical History Is Significant For: Past Surgical History:  Procedure Laterality Date  . ESOPHAGOGASTRODUODENOSCOPY  Her Family History Is Significant For: Family History  Problem Relation Age of Onset  . Migraines Father   . Hypertension Father     Her Social History Is Significant For: Social History   Social History  . Marital status: Single    Spouse name: N/A  . Number of children: N/A  . Years of education: HS   Occupational History  . [employer]    Social History Main Topics  . Smoking status: Never Smoker  . Smokeless tobacco: Never Used  . Alcohol use No  . Drug use: No  . Sexual activity: Not Asked   Other Topics Concern  . None   Social History Narrative   Lives at home w/ her mom   Left-handed   Caffeine: occasional tea, has stopped drinking sodas x2 months (10/30/2016)       Her Allergies Are:  Allergies  Allergen Reactions  . Peanut-Containing Drug Products Anaphylaxis  . Penicillins Anaphylaxis, Hives and Other (See Comments)    Has patient had a PCN reaction causing  immediate rash, facial/tongue/throat swelling, SOB or lightheadedness with hypotension: Yes Has patient had a PCN reaction causing severe rash involving mucus membranes or skin necrosis: No Has patient had a PCN reaction that required hospitalization No Has patient had a PCN reaction occurring within the last 10 years: No If all of the above answers are "NO", then may proceed with Cephalosporin use.   :   Her Current Medications Are:  Outpatient Encounter Prescriptions as of 05/10/2017  Medication Sig  . albuterol (PROVENTIL HFA;VENTOLIN HFA) 108 (90 Base) MCG/ACT inhaler Inhale 1-2 puffs into the lungs every 6 (six) hours as needed for wheezing.  . cetirizine (ZYRTEC) 10 MG tablet One 96m tablet daily.  .Marland Kitchengabapentin (NEURONTIN) 100 MG capsule Take 2 caps at bedtime for two weeks if headache does not improve increase to 3 caps at bedtime  . LINZESS 290 MCG CAPS capsule Take 290 mcg by mouth daily before breakfast.   . MONONESSA 0.25-35 MG-MCG tablet Take 1 tablet by mouth daily.  . nortriptyline (PAMELOR) 50 MG capsule Take 1 capsule (50 mg total) by mouth at bedtime.  .Marland Kitchenomeprazole (PRILOSEC) 40 MG capsule Take 40 mg by mouth daily.   . polyethylene glycol powder (GLYCOLAX/MIRALAX) powder Take 17 g by mouth 2 (two) times daily. (Patient taking differently: Take 17 g by mouth daily as needed for mild constipation. )  . rizatriptan (MAXALT-MLT) 10 MG disintegrating tablet Take 1 tablet (10 mg total) by mouth as needed for migraine. May repeat in 2 hours if needed. Not to exceed two tablets in 24 hours  . [DISCONTINUED] ondansetron (ZOFRAN ODT) 4 MG disintegrating tablet Take 1 tablet (4 mg total) by mouth every 8 (eight) hours as needed for nausea or vomiting.  . [DISCONTINUED] predniSONE (DELTASONE) 5 MG tablet Begin taking 6 tablets daily, taper by one tablet daily until off the medication.   No facility-administered encounter medications on file as of 05/10/2017.   :  Review of Systems:  Out  of a complete 14 point review of systems, all are reviewed and negative with the exception of these symptoms as listed below: Review of Systems  Neurological:       Pt presents today to follow up on her migraines. Pt reports that she had 26 migraines last month. She is also keeping track of this month. Pt is reporting a headache today as well.    Objective:  Neurologic Exam  Physical Exam Physical Examination:  Vitals:   05/10/17 1413  BP: 129/78  Pulse: 87   General Examination: The patient is a very pleasant 21 y.o. female in no acute distress. She appears well-developed and well-nourished and well groomed. Good spirits, mildly photophobic.   HEENT: Normocephalic, atraumatic, pupils are equal, round and reactive to light and accommodation. Extraocular tracking is good without limitation to gaze excursion or nystagmus noted. She has corrective eyeglasses. She is mildly sensitive to light. Hearing is grossly intact. Face is symmetric with normal facial animation and normal facial sensation. Speech is clear with no dysarthria noted. There is no hypophonia. There is no lip, neck/head, jaw or voice tremor. Neck is supple with full range of passive and active motion. There are no carotid bruits on auscultation. Oropharynx exam reveals: mild mouth dryness, adequate dental hygiene and moderate airway crowding, due to smaller airway entry, tonsils of 2-3+ bilaterally and thicker soft palate. Mallampati is class II. Tongue protrudes centrally and palate elevates symmetrically.  Chest: Clear to auscultation without wheezing, rhonchi or crackles noted.  Heart: S1+S2+0, regular and normal without murmurs, rubs or gallops noted.   Abdomen: Soft, non-tender and non-distended with normal bowel sounds appreciated on auscultation.  Extremities: There is no pitting edema in the distal lower extremities bilaterally. Pedal pulses are intact.  Skin: Warm and dry without trophic changes noted. There  are no varicose veins.  Musculoskeletal: exam reveals no obvious joint deformities, tenderness or joint swelling or erythema.   Neurologically:  Mental status: The patient is awake, alert and oriented in all 4 spheres. Her immediate and remote memory, attention, language skills and fund of knowledge are appropriate. There is no evidence of aphasia, agnosia, apraxia or anomia. Speech is clear with normal prosody and enunciation. Thought process is linear. Mood is normal and affect is normal.  Cranial nerves II - XII are as described above under HEENT exam. In addition: shoulder shrug is normal with equal shoulder height noted. Motor exam: Normal bulk, strength and tone is noted. There is no drift, tremor or rebound. Romberg is negative. Reflexes are 2+ throughout. Fine motor skills and coordination: intact with normal finger taps, normal hand movements, normal rapid alternating patting, normal foot taps and normal foot agility.  Cerebellar testing: No dysmetria or intention tremor on finger to nose testing. Heel to shin is unremarkable bilaterally. There is no truncal or gait ataxia.  Sensory exam: intact to light touch in the upper and lower extremities.  Gait, station and balance: She stands easily. No veering to one side is noted. No leaning to one side is noted. Posture is age-appropriate and stance is narrow based. Gait shows normal stride length and normal pace. No problems turning are noted. Tandem walk is unremarkable.   Assessment and Plan:   In summary, Kaely Hollan is a very pleasant 21 year old female with an underlying medical history of asthma, allergies, and obesity, who presents for follow-up consultation of her intractable migraines without aura. She has had migraine headaches since preteen years. She has been on nortriptyline 50 mg each night and gabapentin 200 mg each night for preventative medications but feels that this regimen is no longer working for her. She had been on  Imitrex and we switched her to Maxalt which is a little better she feels. She had a baseline sleep study in April 2017 which was essentially negative for OSA with the exception of mild REM related OSA. We again talked about headache triggers. Stress could be a trigger in her case. She  is trying to keep a good regimen with her sleep schedule. She tries to be in bed around 9:30 and 10 and wakeup time is 6:30. She is in school Monday through Fridays. At this juncture, I suggested she taper off of nortriptyline and gabapentin, she was given written instructions for this and we start Topamax generic with gradual increase, she was given a new prescription and written instructions for this. I did advise her that Topamax tends to interfere and reduce the efficacy of oral contraceptive pills but she is on an IUD. We can think about Botox injections for future consideration. She would potentially be interested in this and may be a good candidate for this. For now we will try Topamax and she has refills for her Maxalt as needed. I suggested a three-month checkup with Megan, sooner as needed. I answered all her questions today and she was in agreement. I spent 25 minutes in total face-to-face time with the patient, more than 50% of which was spent in counseling and coordination of care, reviewing test results, reviewing medication and discussing or reviewing the diagnosis of Migraine, its prognosis and treatment options. Pertinent laboratory and imaging test results that were available during this visit with the patient were reviewed by me and considered in my medical decision making (see chart for details).

## 2017-05-10 NOTE — Patient Instructions (Addendum)
We will taper you off the gabapentin: reduce to 100 mg which is one pill daily for 1 week, then stop.   We will taper the nortriptyline too: I have changed the prescription to 10 mg: Take 4 pills each night for 3 nights, then 3 pills each night for 3 nights, then 2 pills each night for 3 nights, then 1 pill each night for 3 nights, then stop.  We will start you on Topamax 50 mg tabs: Take half a pill each bedtime for one week, then one pill at bedtime daily for one week, then 1-1/2 pills at bedtime daily for one week, then 2 pills at bedtime daily thereafter. Common side effects reported are: Sedation, sleepiness, tingling, change in taste especially with carbonated drinks, and rare side effects are glaucoma, kidney stones, and problems with thinking including word finding difficulties.  You may continue with Maxalt as needed. You have refills left for that.

## 2017-05-30 ENCOUNTER — Telehealth: Payer: Self-pay | Admitting: Adult Health

## 2017-05-30 ENCOUNTER — Ambulatory Visit: Payer: Medicaid Other | Admitting: Neurology

## 2017-05-30 NOTE — Telephone Encounter (Signed)
Pt was sent a letter due to no show, but it was returned. Now sending a second one. °

## 2017-06-08 ENCOUNTER — Emergency Department (HOSPITAL_BASED_OUTPATIENT_CLINIC_OR_DEPARTMENT_OTHER)
Admission: EM | Admit: 2017-06-08 | Discharge: 2017-06-08 | Disposition: A | Discharge status: Home / Self Care | Payer: Medicaid Other | Attending: Emergency Medicine | Admitting: Emergency Medicine

## 2017-06-08 DIAGNOSIS — R21 Rash and other nonspecific skin eruption: Secondary | ICD-10-CM | POA: Diagnosis present

## 2017-06-08 DIAGNOSIS — W57XXXA Bitten or stung by nonvenomous insect and other nonvenomous arthropods, initial encounter: Secondary | ICD-10-CM | POA: Diagnosis not present

## 2017-06-08 DIAGNOSIS — Z91010 Allergy to peanuts: Secondary | ICD-10-CM | POA: Insufficient documentation

## 2017-06-08 DIAGNOSIS — I10 Essential (primary) hypertension: Secondary | ICD-10-CM | POA: Diagnosis not present

## 2017-06-08 DIAGNOSIS — Z79899 Other long term (current) drug therapy: Secondary | ICD-10-CM | POA: Diagnosis not present

## 2017-06-08 DIAGNOSIS — L03113 Cellulitis of right upper limb: Secondary | ICD-10-CM | POA: Diagnosis not present

## 2017-06-08 MED ORDER — SULFAMETHOXAZOLE-TRIMETHOPRIM 800-160 MG PO TABS: 1.0000 | ORAL_TABLET | Freq: Two times a day (BID) | ORAL | 0 refills | Status: AC

## 2017-06-08 MED FILL — SULFAMETHOXAZOLE/TMP DS TAB: 800-160 | 7 days supply | Qty: 14 | Fill #0

## 2017-06-08 NOTE — Discharge Instructions (Signed)
Please take your medications as prescribed. Take your antibiotic, do not save or share them. You may also take Aleve and Tylenol together as we discussed to help with pain. If your symptoms worsen over the next 2 days, please return to ED for recheck or for any other concerning symptoms.

## 2017-06-08 NOTE — ED Triage Notes (Signed)
Reports spider bite to right arm x 2 days.  Reports swelling, pruritis.

## 2017-06-08 NOTE — ED Provider Notes (Signed)
[facility]   CSN: [id number] Arrival date & time: 06/08/17  1248     History   Chief Complaint Chief Complaint  Patient presents with  . Insect Bite    HPI Nichole Bush is a 21 y.o. female.  HPI here for evaluation of possible insect bite. Patient reports 2 days ago she saw a small brown spider on her arm, brushed it away, but then noticed the next day she had a small bump that started to blister under her right elbow. Since that time the area has become more painful, red and swollen. She has not tried anything to improve her symptoms. Palpation and certain movements worsen discomfort. No fevers, chills, numbness or weakness. She denies any medical problems.  Past Medical History:  Diagnosis Date  . Constipation   . Hypertension   . Migraine   . Thyroid disease     Patient Active Problem List   Diagnosis Date Noted  . Constipation 04/22/2015  . CN (constipation)     Past Surgical History:  Procedure Laterality Date  . ESOPHAGOGASTRODUODENOSCOPY      OB History    No data available       Home Medications    Prior to Admission medications   Medication Sig Start Date End Date Taking? Authorizing Provider  albuterol (PROVENTIL HFA;VENTOLIN HFA) 108 (90 Base) MCG/ACT inhaler Inhale 1-2 puffs into the lungs every 6 (six) hours as needed for wheezing. 10/09/16   Rolland PorterJames, Mark, MD  cetirizine (ZYRTEC) 10 MG tablet One 10mg  tablet daily. 06/21/16   [provider]  gabapentin (NEURONTIN) 100 MG capsule Take 2 caps at bedtime for two weeks if headache does not improve increase to 3 caps at bedtime 01/30/17   Butch PennyMillikan, Megan, NP  LINZESS 290 MCG CAPS capsule Take 290 mcg by mouth daily before breakfast.  06/05/16   [provider]  MONONESSA 0.25-35 MG-MCG tablet Take 1 tablet by mouth daily. 08/22/16   [provider]  nortriptyline (PAMELOR) 10 MG capsule Take 4 pills each night for 3 nights, then 3 pills each night for 3  nights, then 2 pills each night for 3 nights, then 1 pill each night for 3 nights, then stop. 05/10/17   Huston FoleyAthar, Saima, MD  omeprazole (PRILOSEC) 40 MG capsule Take 40 mg by mouth daily.  06/21/16   [provider]  polyethylene glycol powder (GLYCOLAX/MIRALAX) powder Take 17 g by mouth 2 (two) times daily. Patient taking differently: Take 17 g by mouth daily as needed for mild constipation.  04/24/15   Saverio DankerStephens, Sarah E, MD  rizatriptan (MAXALT-MLT) 10 MG disintegrating tablet Take 1 tablet (10 mg total) by mouth as needed for migraine. May repeat in 2 hours if needed. Not to exceed two tablets in 24 hours 02/01/17   Butch PennyMillikan, Megan, NP  sulfamethoxazole-trimethoprim (BACTRIM DS,SEPTRA DS) 800-160 MG tablet Take 1 tablet by mouth 2 (two) times daily. 06/08/17 06/15/17  Toniesha Zellner, Sharlet SalinaBenjamin, PA-C  topiramate (TOPAMAX) 50 MG tablet 1/2 pill each bedtime x 1 week, then 1 pill nightly x 1 week, then 1-1/2 pills nightly x 1 week, then 2 pills nightly thereafter. 05/10/17   Huston FoleyAthar, Saima, MD    Family History Family History  Problem Relation Age of Onset  . Migraines Father   . Hypertension Father     Social History Social History  Substance Use Topics  . Smoking status: Never Smoker  . Smokeless tobacco: Never Used  . Alcohol use No  Allergies   Peanut-containing drug products and Penicillins   Review of Systems Review of Systems See history of present illness  Physical Exam Updated Vital Signs BP 113/77 (BP Location: Left Arm)   Pulse 88   Temp 98.5 F (36.9 C) (Oral)   Resp 16   Ht 5\' 2"  (1.575 m)   Wt 90.7 kg (200 lb)   LMP 06/08/2017   SpO2 100%   BMI 36.58 kg/m   Physical Exam  Constitutional: She appears well-developed. No distress.  Awake, alert and nontoxic in appearance  HENT:  Head: Normocephalic and atraumatic.  Right Ear: External ear normal.  Left Ear: External ear normal.  Mouth/Throat: Oropharynx is clear and moist.  Eyes: Conjunctivae and EOM are normal.  Pupils are equal, round, and reactive to light.  Neck: Normal range of motion. No JVD present.  Cardiovascular: Normal rate, regular rhythm and normal heart sounds.   Pulmonary/Chest: Effort normal and breath sounds normal. No stridor.  Abdominal: Soft. There is no tenderness.  Musculoskeletal: Normal range of motion.  Maintains good range of motion of right elbow and shoulder with no bony tenderness. Distal pulses are intact with brisk cap refill.  Neurological:  Awake, alert, cooperative and aware of situation; motor strength bilaterally; sensation normal to light touch bilaterally; no facial asymmetry; tongue midline; major cranial nerves appear intact;  baseline gait without new ataxia.  Skin: No rash noted. She is not diaphoretic.  Posterior aspect of right elbow, just proximal to elbow, there is a small superficial blister area roughly 1 cm in diameter. Surrounding erythema and tenderness.  Psychiatric: She has a normal mood and affect. Her behavior is normal. Thought content normal.  Nursing note and vitals reviewed.  Vitals:   06/08/17 1302 06/08/17 1304  BP:  113/77  Pulse:  88  Resp:  16  Temp:  98.5 F (36.9 C)  TempSrc:  Oral  SpO2:  100%  Weight: 90.7 kg (200 lb)   Height: 5\' 2"  (1.575 m)      ED Treatments / Results  Labs (all labs ordered are listed, but only abnormal results are displayed) Labs Reviewed - No data to display  EKG  EKG Interpretation None       Radiology No results found.  Procedures Procedures (including critical care time)  Medications Ordered in ED Medications - No data to display   Initial Impression / Assessment and Plan / ED Course  I have reviewed the triage vital signs and the nursing notes.  Pertinent labs & imaging results that were available during my care of the patient were reviewed by me and considered in my medical decision making (see chart for details).   Consistent with cellulitis. Started on Bactrim and discussed  continued supportive care at home. Follow-up with PCP. Return precautions discussed.  Final Clinical Impressions(s) / ED Diagnoses   Final diagnoses:  Insect bite, initial encounter  Cellulitis of right upper extremity    New Prescriptions New Prescriptions   SULFAMETHOXAZOLE-TRIMETHOPRIM (BACTRIM DS,SEPTRA DS) 800-160 MG TABLET    Take 1 tablet by mouth 2 (two) times daily.     Joycie Peek, PA-C 06/08/17 1339    Melene Plan, DO 06/08/17 1524

## 2017-07-04 ENCOUNTER — Emergency Department (HOSPITAL_BASED_OUTPATIENT_CLINIC_OR_DEPARTMENT_OTHER)
Admission: EM | Admit: 2017-07-04 | Discharge: 2017-07-04 | Disposition: A | Discharge status: Home / Self Care | Payer: Medicaid Other | Attending: Physician Assistant | Admitting: Physician Assistant

## 2017-07-04 ENCOUNTER — Encounter (HOSPITAL_BASED_OUTPATIENT_CLINIC_OR_DEPARTMENT_OTHER): Payer: Self-pay | Admitting: Emergency Medicine

## 2017-07-04 DIAGNOSIS — I10 Essential (primary) hypertension: Secondary | ICD-10-CM | POA: Diagnosis not present

## 2017-07-04 DIAGNOSIS — Y939 Activity, unspecified: Secondary | ICD-10-CM | POA: Insufficient documentation

## 2017-07-04 DIAGNOSIS — S61213A Laceration without foreign body of left middle finger without damage to nail, initial encounter: Secondary | ICD-10-CM | POA: Diagnosis not present

## 2017-07-04 DIAGNOSIS — S61203A Unspecified open wound of left middle finger without damage to nail, initial encounter: Secondary | ICD-10-CM | POA: Diagnosis present

## 2017-07-04 DIAGNOSIS — W260XXA Contact with knife, initial encounter: Secondary | ICD-10-CM | POA: Insufficient documentation

## 2017-07-04 DIAGNOSIS — Y999 Unspecified external cause status: Secondary | ICD-10-CM | POA: Diagnosis not present

## 2017-07-04 DIAGNOSIS — E039 Hypothyroidism, unspecified: Secondary | ICD-10-CM | POA: Insufficient documentation

## 2017-07-04 DIAGNOSIS — Y929 Unspecified place or not applicable: Secondary | ICD-10-CM | POA: Insufficient documentation

## 2017-07-04 NOTE — ED Triage Notes (Signed)
Reports left middle finger laceration after cutting it on knife 2 hours PTA. No active bleeding at present.

## 2017-07-04 NOTE — Discharge Instructions (Signed)
Your laceration has been repaired with dermabond. The film will usually remain in place for 5-10 days, then naturally fall off your skin. Keep the bandaging dry. Replace the dressing daily until the adhesive film has fallen off or if the bandage should become wet. When changing the dressing, do not apply tape directly over the dermabond adhesive film as removing the tape later may also remove the film. Do not apply topical liquids or ointments to the area while the dermabond is in place. This may loosen the film. You may occasionally breifely wet your wound in a shower or bath. Do not soak or scrub your wound. Do not swim. Avoid periods of heavy perspiration. After showering, gently blot your wound dry with a soft towel and apply new clean bandage. Protect your wound from injury. Do not scratch, rub or pick at the Dermabond film.   SEEK MEDICAL CARE IF:  You have redness, swelling, or increasing pain in the wound.  You see a red line that goes away from the wound.  You have yellowish-white fluid (pus) coming from the wound.  You have a fever.  You notice a bad smell coming from the wound or dressing.  Your wound breaks open before or after sutures have been removed.  You notice something coming out of the wound such as wood or glass.  Your wound is on your hand or foot and you cannot move a finger or toe.  Your pain is not controlled with prescribed medicine.   If you did not receive a tetanus shot today because you thought you were up to date, but did not recall when your last one was given, make sure to check with your primary caregiver to determine if you need one. If you do not have a PCP use the number attached to call and find a PCP.

## 2017-07-04 NOTE — ED Notes (Signed)
Dermabond placed at bedside for use by ED PA

## 2017-07-04 NOTE — ED Notes (Signed)
ED Provider at bedside. 

## 2017-07-04 NOTE — ED Provider Notes (Signed)
[facility]   CSN: [id number] Arrival date & time: 07/04/17  1753     History   Chief Complaint Chief Complaint  Patient presents with  . Extremity Laceration    HPI Nichole Bush is a 21 y.o. right handed female presents to the emergency department today for laceration to left 3rd digit after cutting it on a knife at 1100. Patient states she was at school cutting vegetables when the knife slipped and she cut her finger. Bleeding has been controlled since. Pain over area of laceration. No relieving factors PTA. No numbness, tingling or decreased rom. Last Tetanus 2016.   HPI  Past Medical History:  Diagnosis Date  . Constipation   . Hypertension   . Migraine   . Thyroid disease     Patient Active Problem List   Diagnosis Date Noted  . Constipation 04/22/2015  . CN (constipation)     Past Surgical History:  Procedure Laterality Date  . ESOPHAGOGASTRODUODENOSCOPY      OB History    No data available       Home Medications    Prior to Admission medications   Medication Sig Start Date End Date Taking? Authorizing Provider  albuterol (PROVENTIL HFA;VENTOLIN HFA) 108 (90 Base) MCG/ACT inhaler Inhale 1-2 puffs into the lungs every 6 (six) hours as needed for wheezing. 10/09/16   Rolland PorterJames, Mark, MD  cetirizine (ZYRTEC) 10 MG tablet One 10mg  tablet daily. 06/21/16   [provider]  gabapentin (NEURONTIN) 100 MG capsule Take 2 caps at bedtime for two weeks if headache does not improve increase to 3 caps at bedtime 01/30/17   Butch PennyMillikan, Megan, NP  LINZESS 290 MCG CAPS capsule Take 290 mcg by mouth daily before breakfast.  06/05/16   [provider]  MONONESSA 0.25-35 MG-MCG tablet Take 1 tablet by mouth daily. 08/22/16   [provider]  nortriptyline (PAMELOR) 10 MG capsule Take 4 pills each night for 3 nights, then 3 pills each night for 3 nights, then 2 pills each night for 3 nights, then 1 pill each night for 3 nights, then  stop. 05/10/17   Huston FoleyAthar, Saima, MD  omeprazole (PRILOSEC) 40 MG capsule Take 40 mg by mouth daily.  06/21/16   [provider]  polyethylene glycol powder (GLYCOLAX/MIRALAX) powder Take 17 g by mouth 2 (two) times daily. Patient taking differently: Take 17 g by mouth daily as needed for mild constipation.  04/24/15   Saverio DankerStephens, Sarah E, MD  rizatriptan (MAXALT-MLT) 10 MG disintegrating tablet Take 1 tablet (10 mg total) by mouth as needed for migraine. May repeat in 2 hours if needed. Not to exceed two tablets in 24 hours 02/01/17   Butch PennyMillikan, Megan, NP  topiramate (TOPAMAX) 50 MG tablet 1/2 pill each bedtime x 1 week, then 1 pill nightly x 1 week, then 1-1/2 pills nightly x 1 week, then 2 pills nightly thereafter. 05/10/17   Huston FoleyAthar, Saima, MD    Family History Family History  Problem Relation Age of Onset  . Migraines Father   . Hypertension Father     Social History Social History  Substance Use Topics  . Smoking status: Never Smoker  . Smokeless tobacco: Never Used  . Alcohol use No     Allergies   Peanut-containing drug products and Penicillins   Review of Systems Review of Systems  Musculoskeletal: Negative for arthralgias and joint swelling.  Skin: Positive for wound.  Neurological: Negative for weakness and numbness.     Physical  Exam Updated Vital Signs BP 112/74 (BP Location: Right Arm)   Pulse 77   Temp 98.2 F (36.8 C) (Oral)   Resp 16   Ht 5\' 2"  (1.575 m)   Wt 90.7 kg (200 lb)   LMP 07/02/2017 (Approximate)   SpO2 99%   BMI 36.58 kg/m   Physical Exam  Constitutional: She appears well-developed and well-nourished.  HENT:  Head: Normocephalic and atraumatic.  Right Ear: External ear normal.  Left Ear: External ear normal.  Eyes: Conjunctivae are normal. Right eye exhibits no discharge. Left eye exhibits no discharge. No scleral icterus.  Cardiovascular:  Pulses:      Radial pulses are 2+ on the right side, and 2+ on the left side.  Pulmonary/Chest:  Effort normal. No respiratory distress.  Musculoskeletal:       Left wrist: Normal.  Left hand: Laceration noted (see skin section) and otherwise fingers appear normal. No TTP over flexor sheath. Radial/Ulnar arteries 2+ with <2sec cap refill. SILT in M/U/R distributions. Grip 5/5 strength. MCP flexion/extension intact. Finger adduction/abduction intact with 5/5 strength.  Thumb opposition intact. Full ROM to Flexion/Extension at wrist, MCP, PIP and DIP.    Neurological: She is alert.  Skin: Skin is warm and dry. Capillary refill takes less than 2 seconds. Laceration (1.5cm laceration on left 3rd digit distal to dip. Does not break nail bed. ) noted. No pallor.  No surrounding erythema or discharge.   Psychiatric: She has a normal mood and affect.  Nursing note and vitals reviewed.    ED Treatments / Results  Labs (all labs ordered are listed, but only abnormal results are displayed) Labs Reviewed - No data to display  EKG  EKG Interpretation None       Radiology No results found.  Procedures .Marland KitchenLaceration Repair Date/Time: 07/04/2017 7:10 PM Performed by: Jacinto Halim Authorized by: Jacinto Halim   Consent:    Consent obtained:  Verbal   Consent given by:  Patient   Risks discussed:  Infection, need for additional repair, nerve damage, poor wound healing, poor cosmetic result, pain, retained foreign body, tendon damage and vascular damage   Alternatives discussed:  No treatment Anesthesia (see MAR for exact dosages):    Anesthesia method:  None Laceration details:    Location:  Finger   Finger location:  L index finger   Length (cm):  1.5 Repair type:    Repair type:  Simple Exploration:    Contaminated: no   Treatment:    Area cleansed with:  Saline   Amount of cleaning:  Standard   Irrigation solution:  Sterile saline   Irrigation volume:  50   Irrigation method:  Syringe   Visualized foreign bodies/material removed: no   Skin repair:    Repair method:   Tissue adhesive Approximation:    Approximation:  Close Post-procedure details:    Dressing:  Open (no dressing)   Patient tolerance of procedure:  Tolerated well, no immediate complications   (including critical care time)  Medications Ordered in ED Medications - No data to display   Initial Impression / Assessment and Plan / ED Course  I have reviewed the triage vital signs and the nursing notes.  Pertinent labs & imaging results that were available during my care of the patient were reviewed by me and considered in my medical decision making (see chart for details).     21 year old right handed female with laceration to left index finger. Pressure irrigation performed. Wound explored  and base of wound visualized in a bloodless field without evidence of foreign body.  Laceration occurred < 8 hours prior to repair which was well tolerated. Tdap up to date.  Pt has  no comorbidities to effect normal wound healing. Pt discharged without antibiotics.  Discussed suture home care with patient and answered questions. Patient to follow-up with PCP for persistent symptoms. I advised the patient to return to the emergency department with new or worsening symptoms or new concerns. Specific return precautions discussed including sings of infection. The patient verbalized understanding and agreement with plan. All questions answered. No further questions at this time. The patient appears safe for discharge.   Final Clinical Impressions(s) / ED Diagnoses   Final diagnoses:  Laceration of left middle finger without foreign body, nail damage status unspecified, initial encounter    New Prescriptions New Prescriptions   No medications on file     Princella PellegriniMaczis, Sabrinia Prien M, PA-C 07/04/17 1913    Abelino DerrickMackuen, Courteney Lyn, MD 07/05/17 1810

## 2017-07-13 ENCOUNTER — Emergency Department (HOSPITAL_BASED_OUTPATIENT_CLINIC_OR_DEPARTMENT_OTHER)
Admission: EM | Admit: 2017-07-13 | Discharge: 2017-07-13 | Disposition: A | Discharge status: Home / Self Care | Payer: Medicaid Other | Attending: Emergency Medicine | Admitting: Emergency Medicine

## 2017-07-13 ENCOUNTER — Encounter (HOSPITAL_BASED_OUTPATIENT_CLINIC_OR_DEPARTMENT_OTHER): Payer: Self-pay | Admitting: Emergency Medicine

## 2017-07-13 DIAGNOSIS — R51 Headache: Secondary | ICD-10-CM | POA: Diagnosis present

## 2017-07-13 DIAGNOSIS — Z79899 Other long term (current) drug therapy: Secondary | ICD-10-CM | POA: Insufficient documentation

## 2017-07-13 DIAGNOSIS — G43001 Migraine without aura, not intractable, with status migrainosus: Secondary | ICD-10-CM | POA: Diagnosis not present

## 2017-07-13 DIAGNOSIS — I10 Essential (primary) hypertension: Secondary | ICD-10-CM | POA: Insufficient documentation

## 2017-07-13 DIAGNOSIS — G43011 Migraine without aura, intractable, with status migrainosus: Secondary | ICD-10-CM

## 2017-07-13 MED ORDER — PROMETHAZINE HCL 25 MG/ML IJ SOLN
25.0000 mg | Freq: Once | INTRAMUSCULAR | Status: AC
  Administered 2017-07-13: 25 mg via INTRAMUSCULAR
  Filled 2017-07-13: qty 1

## 2017-07-13 MED ORDER — DIPHENHYDRAMINE HCL 50 MG/ML IJ SOLN
25.0000 mg | Freq: Once | INTRAMUSCULAR | Status: AC
  Administered 2017-07-13: 25 mg via INTRAMUSCULAR
  Filled 2017-07-13: qty 1

## 2017-07-13 MED ORDER — DEXAMETHASONE SODIUM PHOSPHATE 10 MG/ML IJ SOLN
10.0000 mg | Freq: Once | INTRAMUSCULAR | Status: AC
  Administered 2017-07-13: 10 mg via INTRAMUSCULAR
  Filled 2017-07-13: qty 1

## 2017-07-13 NOTE — ED Provider Notes (Signed)
[facility]   CSN: [id number] Arrival date & time: 07/13/17  0932     History   Chief Complaint Chief Complaint  Patient presents with  . Headache    HPI Nichole Bush is a 21 y.o. female.  Patient with about a one-week history of headache that is his typical for her migraines. Pain is in the for head area. Associated with photophobia some nausea. No other neuro focal deficits. No fevers. Patient has had good response with migraine cocktails in the past. [facility]. Patient is on Maxalt. Medications not helping.      Past Medical History:  Diagnosis Date  . Constipation   . Hypertension   . Migraine   . Thyroid disease     Patient Active Problem List   Diagnosis Date Noted  . Constipation 04/22/2015  . CN (constipation)     Past Surgical History:  Procedure Laterality Date  . ESOPHAGOGASTRODUODENOSCOPY      OB History    No data available       Home Medications    Prior to Admission medications   Medication Sig Start Date End Date Taking? Authorizing Provider  albuterol (PROVENTIL HFA;VENTOLIN HFA) 108 (90 Base) MCG/ACT inhaler Inhale 1-2 puffs into the lungs every 6 (six) hours as needed for wheezing. 10/09/16   Rolland PorterJames, Mark, MD  cetirizine (ZYRTEC) 10 MG tablet One 10mg  tablet daily. 06/21/16   [provider]  gabapentin (NEURONTIN) 100 MG capsule Take 2 caps at bedtime for two weeks if headache does not improve increase to 3 caps at bedtime 01/30/17   Butch PennyMillikan, Megan, NP  LINZESS 290 MCG CAPS capsule Take 290 mcg by mouth daily before breakfast.  06/05/16   [provider]  MONONESSA 0.25-35 MG-MCG tablet Take 1 tablet by mouth daily. 08/22/16   [provider]  nortriptyline (PAMELOR) 10 MG capsule Take 4 pills each night for 3 nights, then 3 pills each night for 3 nights, then 2 pills each night for 3 nights, then 1 pill each night for 3 nights, then stop. 05/10/17   Huston FoleyAthar,  Saima, MD  omeprazole (PRILOSEC) 40 MG capsule Take 40 mg by mouth daily.  06/21/16   [provider]  polyethylene glycol powder (GLYCOLAX/MIRALAX) powder Take 17 g by mouth 2 (two) times daily. Patient taking differently: Take 17 g by mouth daily as needed for mild constipation.  04/24/15   Saverio DankerStephens, Sarah E, MD  rizatriptan (MAXALT-MLT) 10 MG disintegrating tablet Take 1 tablet (10 mg total) by mouth as needed for migraine. May repeat in 2 hours if needed. Not to exceed two tablets in 24 hours 02/01/17   Butch PennyMillikan, Megan, NP  topiramate (TOPAMAX) 50 MG tablet 1/2 pill each bedtime x 1 week, then 1 pill nightly x 1 week, then 1-1/2 pills nightly x 1 week, then 2 pills nightly thereafter. 05/10/17   Huston FoleyAthar, Saima, MD    Family History Family History  Problem Relation Age of Onset  . Migraines Father   . Hypertension Father     Social History Social History  Substance Use Topics  . Smoking status: Never Smoker  . Smokeless tobacco: Never Used  . Alcohol use No     Allergies   Peanut-containing drug products and Penicillins   Review of Systems Review of Systems  Constitutional: Negative for fever.  HENT: Negative for congestion.   Eyes: Positive for photophobia.  Respiratory: Negative for shortness of breath.   Cardiovascular: Negative for  chest pain.  Gastrointestinal: Positive for nausea. Negative for abdominal pain and vomiting.  Genitourinary: Negative for dysuria.  Musculoskeletal: Negative for back pain.  Skin: Negative for rash.  Neurological: Positive for headaches.  Hematological: Does not bruise/bleed easily.  Psychiatric/Behavioral: Negative for confusion.     Physical Exam Updated Vital Signs BP 119/76 (BP Location: Left Arm)   Pulse 76   Temp 99 F (37.2 C) (Oral)   Resp 18   Ht 1.575 m (5\' 2" )   Wt 93 kg (205 lb 0.4 oz)   LMP 07/02/2017 (Approximate)   SpO2 100%   BMI 37.50 kg/m   Physical Exam  Constitutional: She is oriented to person,  place, and time. She appears well-developed and well-nourished. No distress.  HENT:  Head: Normocephalic and atraumatic.  Mouth/Throat: Oropharynx is clear and moist.  Eyes: Pupils are equal, round, and reactive to light. Conjunctivae and EOM are normal.  Neck: Neck supple.  Cardiovascular: Normal rate and regular rhythm.   Pulmonary/Chest: Effort normal and breath sounds normal.  Abdominal: Soft. Bowel sounds are normal. There is no tenderness.  Musculoskeletal: Normal range of motion.  Neurological: She is alert and oriented to person, place, and time. No cranial nerve deficit or sensory deficit. She exhibits normal muscle tone. Coordination normal.  Skin: Skin is warm. No rash noted.  Nursing note and vitals reviewed.    ED Treatments / Results  Labs (all labs ordered are listed, but only abnormal results are displayed) Labs Reviewed - No data to display  EKG  EKG Interpretation None       Radiology No results found.  Procedures Procedures (including critical care time)  Medications Ordered in ED Medications  dexamethasone (DECADRON) injection 10 mg (10 mg Intramuscular Given 07/13/17 1043)  promethazine (PHENERGAN) injection 25 mg (25 mg Intramuscular Given 07/13/17 1043)  diphenhydrAMINE (BENADRYL) injection 25 mg (25 mg Intramuscular Given 07/13/17 1042)     Initial Impression / Assessment and Plan / ED Course  I have reviewed the triage vital signs and the nursing notes.  Pertinent labs & imaging results that were available during my care of the patient were reviewed by me and considered in my medical decision making (see chart for details).    Patient with typical migraine. Improved here with migraine cocktail. Patient was given Phenergan and Benadryl and Decadron. She wanted IM. Did not want IV. Also did not want them orally. Patient will follow-up with [facility] neurolog[facility]. No concerns for complicated migraine. No fevers. Patient nontoxic no acute  distress.   Final Clinical Impressions(s) / ED Diagnoses   Final diagnoses:  Intractable migraine without aura and with status migrainosus    New Prescriptions New Prescriptions   No medications on file     Vanetta Mulders, MD 07/13/17 1115

## 2017-07-13 NOTE — ED Triage Notes (Signed)
patient reports that she has had this MHA x 10 days. The patient reports that she is nauseated. Her maintance  medications are not currently working

## 2017-07-13 NOTE — Discharge Instructions (Signed)
[facility]. Work/School note provided. Return for new or worse symptoms. Continue your current medications.

## 2017-07-18 ENCOUNTER — Ambulatory Visit: Payer: Medicaid Other | Admitting: Neurology

## 2017-07-18 NOTE — Progress Notes (Signed)
Pt was unable to stay for her 9:00am appt today. She will call back to discuss a new appt. Pt may seen the NP for her office visit if she is agreeable.

## 2017-08-05 ENCOUNTER — Emergency Department (HOSPITAL_BASED_OUTPATIENT_CLINIC_OR_DEPARTMENT_OTHER)
Admission: EM | Admit: 2017-08-05 | Discharge: 2017-08-05 | Disposition: A | Discharge status: Home / Self Care | Payer: Medicaid Other | Attending: Emergency Medicine | Admitting: Emergency Medicine

## 2017-08-05 ENCOUNTER — Encounter (HOSPITAL_BASED_OUTPATIENT_CLINIC_OR_DEPARTMENT_OTHER): Payer: Self-pay | Admitting: Emergency Medicine

## 2017-08-05 DIAGNOSIS — I10 Essential (primary) hypertension: Secondary | ICD-10-CM | POA: Diagnosis not present

## 2017-08-05 DIAGNOSIS — Z79899 Other long term (current) drug therapy: Secondary | ICD-10-CM | POA: Diagnosis not present

## 2017-08-05 DIAGNOSIS — Z91010 Allergy to peanuts: Secondary | ICD-10-CM | POA: Insufficient documentation

## 2017-08-05 DIAGNOSIS — R21 Rash and other nonspecific skin eruption: Secondary | ICD-10-CM | POA: Diagnosis present

## 2017-08-05 DIAGNOSIS — T7840XA Allergy, unspecified, initial encounter: Secondary | ICD-10-CM | POA: Diagnosis not present

## 2017-08-05 MED ORDER — FAMOTIDINE 20 MG PO TABS
20.0000 mg | ORAL_TABLET | Freq: Once | ORAL | Status: AC
  Administered 2017-08-05: 20 mg via ORAL
  Filled 2017-08-05: qty 1

## 2017-08-05 MED ORDER — EPINEPHRINE 0.3 MG/0.3ML IJ SOAJ: 0.3000 mg | Freq: Once | INTRAMUSCULAR | 0 refills | Status: AC

## 2017-08-05 MED ORDER — DIPHENHYDRAMINE HCL 25 MG PO CAPS
25.0000 mg | ORAL_CAPSULE | Freq: Once | ORAL | Status: AC
  Administered 2017-08-05: 25 mg via ORAL
  Filled 2017-08-05: qty 1

## 2017-08-05 MED ORDER — PREDNISONE 20 MG PO TABS
60.0000 mg | ORAL_TABLET | Freq: Every day | ORAL | 0 refills | Status: AC
Start: 2017-08-05 — End: 2017-08-10

## 2017-08-05 MED ORDER — PREDNISONE 10 MG PO TABS
60.0000 mg | ORAL_TABLET | Freq: Once | ORAL | Status: AC
  Administered 2017-08-05: 60 mg via ORAL
  Filled 2017-08-05: qty 1

## 2017-08-05 NOTE — ED Provider Notes (Signed)
[facility]   CSN: [id number] Arrival date & time: 08/05/17  1259     History   Chief Complaint Chief Complaint  Patient presents with  . Rash    HPI Nichole Bush is a 21 y.o. female.  HPI  21 y.o. female, presents to the Emergency Department today due to rash that occurred last night. Notes hx allergies. States eating at [employer] and developing symptoms after. No airway compromise. No breathing difficulty. No trouble swallowing. Benadryl PRN. No pain currently. No fevers. No neck stiffness. No other symptoms noted.   Past Medical History:  Diagnosis Date  . Constipation   . Hypertension   . Migraine   . Thyroid disease     Patient Active Problem List   Diagnosis Date Noted  . Constipation 04/22/2015  . CN (constipation)     Past Surgical History:  Procedure Laterality Date  . ESOPHAGOGASTRODUODENOSCOPY      OB History    No data available       Home Medications    Prior to Admission medications   Medication Sig Start Date End Date Taking? Authorizing Provider  albuterol (PROVENTIL HFA;VENTOLIN HFA) 108 (90 Base) MCG/ACT inhaler Inhale 1-2 puffs into the lungs every 6 (six) hours as needed for wheezing. 10/09/16   Rolland Porter, MD  cetirizine (ZYRTEC) 10 MG tablet One 10mg  tablet daily. 06/21/16   [provider]  gabapentin (NEURONTIN) 100 MG capsule Take 2 caps at bedtime for two weeks if headache does not improve increase to 3 caps at bedtime 01/30/17   Butch Penny, NP  LINZESS 290 MCG CAPS capsule Take 290 mcg by mouth daily before breakfast.  06/05/16   [provider]  MONONESSA 0.25-35 MG-MCG tablet Take 1 tablet by mouth daily. 08/22/16   [provider]  nortriptyline (PAMELOR) 10 MG capsule Take 4 pills each night for 3 nights, then 3 pills each night for 3 nights, then 2 pills each night for 3 nights, then 1 pill each night for 3 nights, then stop. 05/10/17   Huston Foley, MD  omeprazole  (PRILOSEC) 40 MG capsule Take 40 mg by mouth daily.  06/21/16   [provider]  polyethylene glycol powder (GLYCOLAX/MIRALAX) powder Take 17 g by mouth 2 (two) times daily. Patient taking differently: Take 17 g by mouth daily as needed for mild constipation.  04/24/15   Saverio Danker, MD  rizatriptan (MAXALT-MLT) 10 MG disintegrating tablet Take 1 tablet (10 mg total) by mouth as needed for migraine. May repeat in 2 hours if needed. Not to exceed two tablets in 24 hours 02/01/17   Butch Penny, NP  topiramate (TOPAMAX) 50 MG tablet 1/2 pill each bedtime x 1 week, then 1 pill nightly x 1 week, then 1-1/2 pills nightly x 1 week, then 2 pills nightly thereafter. 05/10/17   Huston Foley, MD    Family History Family History  Problem Relation Age of Onset  . Migraines Father   . Hypertension Father     Social History Social History  Substance Use Topics  . Smoking status: Never Smoker  . Smokeless tobacco: Never Used  . Alcohol use No     Allergies   Peanut-containing drug products and Penicillins   Review of Systems Review of Systems  Constitutional: Negative for fever.  HENT: Negative for trouble swallowing.   Respiratory: Negative for shortness of breath and stridor.   Gastrointestinal: Negative for nausea and vomiting.  Skin: Positive for rash.  Allergic/Immunologic: Negative for immunocompromised state.     Physical Exam Updated Vital Signs BP 100/85 (BP Location: Right Arm)   Pulse 89   Temp 98.2 F (36.8 C) (Oral)   Resp 18   Ht 5\' 2"  (1.575 m)   Wt 93 kg (205 lb)   LMP  (LMP Unknown)   SpO2 100%   BMI 37.49 kg/m   Physical Exam  Constitutional: She is oriented to person, place, and time. She appears well-developed and well-nourished. No distress.  NAD. Phonating well. No evidence of airway compromise   HENT:  Head: Normocephalic and atraumatic.  Right Ear: Tympanic membrane, external ear and ear canal normal.  Left Ear: Tympanic membrane,  external ear and ear canal normal.  Nose: Nose normal.  Mouth/Throat: Uvula is midline, oropharynx is clear and moist and mucous membranes are normal. No trismus in the jaw. No oropharyngeal exudate, posterior oropharyngeal erythema or tonsillar abscesses.  Eyes: Pupils are equal, round, and reactive to light. EOM are normal.  Neck: Normal range of motion. Neck supple. No tracheal deviation present.  Cardiovascular: Normal rate, regular rhythm, S1 normal, S2 normal, normal heart sounds, intact distal pulses and normal pulses.   Pulmonary/Chest: Effort normal and breath sounds normal. No respiratory distress. She has no decreased breath sounds. She has no wheezes. She has no rhonchi. She has no rales.  Abdominal: Normal appearance and bowel sounds are normal. There is no tenderness.  Musculoskeletal: Normal range of motion.  Neurological: She is alert and oriented to person, place, and time.  Skin: Skin is warm and dry.  Diffuse urticarial rash  Psychiatric: She has a normal mood and affect. Her speech is normal and behavior is normal. Thought content normal.   ED Treatments / Results  Labs (all labs ordered are listed, but only abnormal results are displayed) Labs Reviewed - No data to display  EKG  EKG Interpretation None       Radiology No results found.  Procedures Procedures (including critical care time)  Medications Ordered in ED Medications - No data to display   Initial Impression / Assessment and Plan / ED Course  I have reviewed the triage vital signs and the nursing notes.  Pertinent labs & imaging results that were available during my care of the patient were reviewed by me and considered in my medical decision making (see chart for details).  Final Clinical Impressions(s) / ED Diagnoses     {I have reviewed the relevant previous healthcare records.  {I obtained HPI from historian.   ED Course:  Assessment: Pt is a 21 y.o. female presents to the Emergency  Department today due to rash that occurred last night. Notes hx allergies. States eating at [employer] and developing symptoms after. No airway compromise. No breathing difficulty. No trouble swallowing. Benadryl PRN. No pain currently. No fevers. No neck stiffness. On exam, pt in NAD. Nontoxic/nonseptic appearing. VSS. Afebrile. Lungs CTA. Heart RRR. No evidence of airway compromise.Given steroids, benadryl in ED. Plan is to DC home with steroids and epi pen. At time of discharge, Patient is in no acute distress. Vital Signs are stable. Patient is able to ambulate. Patient able to tolerate PO.   Disposition/Plan:  DC Home Additional Verbal discharge instructions given and discussed with patient.  Pt Instructed to f/u with PCP in the next week for evaluation and treatment of symptoms. Return precautions given Pt acknowledges and agrees with plan  Supervising Physician Lavera Guise, MD  Final diagnoses:  Allergic  reaction, initial encounter    New Prescriptions New Prescriptions   No medications on file     Audry PiliMohr, Traevion Poehler, Cordelia Poche-C 08/05/17 1443    Lavera GuiseLiu, Dana Duo, MD 08/05/17 [phone]

## 2017-08-05 NOTE — Discharge Instructions (Signed)
Please read and follow all provided instructions.  Your diagnoses today include:  1. Allergic reaction, initial encounter     Tests performed today include: Vital signs. See below for your results today.   Medications prescribed:  Take as prescribed   Home care instructions:  Follow any educational materials contained in this packet.  Follow-up instructions: Please follow-up with your primary care provider for further evaluation of symptoms and treatment   Return instructions:  Please return to the Emergency Department if you do not get better, if you get worse, or new symptoms OR  - Fever (temperature greater than 101.62F)  - Bleeding that does not stop with holding pressure to the area    -Severe pain (please note that you may be more sore the day after your accident)  - Chest Pain  - Difficulty breathing  - Severe nausea or vomiting  - Inability to tolerate food and liquids  - Passing out  - Skin becoming red around your wounds  - Change in mental status (confusion or lethargy)  - New numbness or weakness    Please return if you have any other emergent concerns.  Additional Information:  Your vital signs today were: BP 100/85 (BP Location: Right Arm)    Pulse 89    Temp 98.2 F (36.8 C) (Oral)    Resp 18    Ht 5\' 2"  (1.575 m)    Wt 93 kg (205 lb)    LMP  (LMP Unknown)    SpO2 100%    BMI 37.49 kg/m  If your blood pressure (BP) was elevated above 135/85 this visit, please have this repeated by your doctor within one month. ---------------

## 2017-08-05 NOTE — ED Triage Notes (Addendum)
patient states that she started to have a rash all over her body starting last night. The patient reports that she has taken benadryl at home. Reports that she has neck stiffness as well, Denies any SOB, chest pain or throat swelling

## 2017-08-30 ENCOUNTER — Ambulatory Visit: Payer: Medicaid Other | Admitting: Adult Health

## 2017-09-11 ENCOUNTER — Encounter (HOSPITAL_BASED_OUTPATIENT_CLINIC_OR_DEPARTMENT_OTHER): Payer: Self-pay | Admitting: *Deleted

## 2017-09-11 ENCOUNTER — Emergency Department (HOSPITAL_BASED_OUTPATIENT_CLINIC_OR_DEPARTMENT_OTHER)
Admission: EM | Admit: 2017-09-11 | Discharge: 2017-09-11 | Disposition: A | Discharge status: Home / Self Care | Payer: Medicaid Other | Attending: Emergency Medicine | Admitting: Emergency Medicine

## 2017-09-11 DIAGNOSIS — I10 Essential (primary) hypertension: Secondary | ICD-10-CM | POA: Diagnosis not present

## 2017-09-11 DIAGNOSIS — R21 Rash and other nonspecific skin eruption: Secondary | ICD-10-CM | POA: Diagnosis present

## 2017-09-11 DIAGNOSIS — T7840XA Allergy, unspecified, initial encounter: Secondary | ICD-10-CM

## 2017-09-11 DIAGNOSIS — Z91010 Allergy to peanuts: Secondary | ICD-10-CM | POA: Insufficient documentation

## 2017-09-11 MED ORDER — FAMOTIDINE 20 MG PO TABS
40.0000 mg | ORAL_TABLET | Freq: Once | ORAL | Status: AC
  Administered 2017-09-11: 40 mg via ORAL
  Filled 2017-09-11: qty 2

## 2017-09-11 MED ORDER — DIPHENHYDRAMINE HCL 25 MG PO CAPS
ORAL_CAPSULE | ORAL | Status: AC
  Administered 2017-09-11: 50 mg
  Filled 2017-09-11: qty 2

## 2017-09-11 MED ORDER — DIPHENHYDRAMINE HCL 25 MG PO CAPS: 25.0000 mg | ORAL_CAPSULE | Freq: Four times a day (QID) | ORAL | 0 refills | Status: DC | PRN

## 2017-09-11 MED ORDER — EPINEPHRINE 0.3 MG/0.3ML IJ SOAJ: 0.3000 mg | Freq: Once | INTRAMUSCULAR | 3 refills | Status: AC

## 2017-09-11 MED ORDER — DEXAMETHASONE 6 MG PO TABS
10.0000 mg | ORAL_TABLET | Freq: Once | ORAL | Status: AC
  Administered 2017-09-11: 10 mg via ORAL
  Filled 2017-09-11: qty 1

## 2017-09-11 NOTE — ED Provider Notes (Signed)
[facility]   CSN: [id number] Arrival date & time: 09/11/17  2023     History   Chief Complaint Chief Complaint  Patient presents with  . Allergic Reaction    HPI Nichole Bush is a 21 y.o. female who presents today with chief complaint acute onset, progressively improving generalized rash and throat tightness. She states that earlier today approximately 1.5 hours ago she was eating at a [employer] when she developed a raised erythematous pruritic rash to her bilateral upper extremities, chest, and back. She also endorses throat tightness. States that she has eaten at this hypoxia restaurant in the past, but she "ordered the chicken and not to shrimp this time ". States that the last time she ate at this hypoxia restaurant she had a similar allergic reaction for which she was seen and evaluated here 1 month ago. Denies drooling, headaches, nausea, vomiting, shortness of breath, or chest pain. She was given Benadryl and Decadron prior to my assessment and states that she is completely symptomatic at this time and is feeling much better.  The history is provided by the patient.    Past Medical History:  Diagnosis Date  . Constipation   . Hypertension   . Migraine   . Thyroid disease     Patient Active Problem List   Diagnosis Date Noted  . Constipation 04/22/2015  . CN (constipation)     Past Surgical History:  Procedure Laterality Date  . ESOPHAGOGASTRODUODENOSCOPY      OB History    No data available       Home Medications    Prior to Admission medications   Medication Sig Start Date End Date Taking? Authorizing Provider  albuterol (PROVENTIL HFA;VENTOLIN HFA) 108 (90 Base) MCG/ACT inhaler Inhale 1-2 puffs into the lungs every 6 (six) hours as needed for wheezing. 10/09/16   Rolland Porter, MD  cetirizine (ZYRTEC) 10 MG tablet One  tablet daily. 06/21/16   [provider]  diphenhydrAMINE (BENADRYL) 25 mg capsule Take 1  capsule (25 mg total) by mouth every 6 (six) hours as needed for itching or allergies. 09/11/17   Luevenia Maxin, Danicka Hourihan A, PA-C  gabapentin (NEURONTIN) 100 MG capsule Take 2 caps at bedtime for two weeks if headache does not improve increase to 3 caps at bedtime 01/30/17   Butch Penny, NP  LINZESS 290 MCG CAPS capsule Take 290 mcg by mouth daily before breakfast.  06/05/16   [provider]  MONONESSA 0.25-35 MG-MCG tablet Take 1 tablet by mouth daily. 08/22/16   [provider]  nortriptyline (PAMELOR) 10 MG capsule Take 4 pills each night for 3 nights, then 3 pills each night for 3 nights, then 2 pills each night for 3 nights, then 1 pill each night for 3 nights, then stop. 05/10/17   Huston Foley, MD  omeprazole (PRILOSEC) 40 MG capsule Take 40 mg by mouth daily.  06/21/16   [provider]  polyethylene glycol powder (GLYCOLAX/MIRALAX) powder Take 17 g by mouth 2 (two) times daily. Patient taking differently: Take 17 g by mouth daily as needed for mild constipation.  04/24/15   Saverio Danker, MD  rizatriptan (MAXALT-MLT) 10 MG disintegrating tablet Take 1 tablet (10 mg total) by mouth as needed for migraine. May repeat in 2 hours if needed. Not to exceed two tablets in 24 hours 02/01/17   Butch Penny, NP  topiramate (TOPAMAX) 50 MG tablet 1/2 pill each bedtime x 1 week, then 1 pill nightly x  1 week, then 1-1/2 pills nightly x 1 week, then 2 pills nightly thereafter. 05/10/17   Huston Foley, MD    Family History Family History  Problem Relation Age of Onset  . Migraines Father   . Hypertension Father     Social History Social History  Substance Use Topics  . Smoking status: Never Smoker  . Smokeless tobacco: Never Used  . Alcohol use No     Allergies   Peanut-containing drug products and Penicillins   Review of Systems Review of Systems  Constitutional: Negative for chills and fever.  HENT: Negative for drooling and facial swelling.   Respiratory: Negative for  shortness of breath.   Cardiovascular: Negative for chest pain.  Gastrointestinal: Negative for abdominal pain, nausea and vomiting.  Skin: Positive for rash.     Physical Exam Updated Vital Signs BP 127/79 (BP Location: Right Arm)   Pulse 79   Temp 97.9 F (36.6 C) (Oral)   Resp 18   Ht  (1.575 m)   Wt 93 kg (205 lb)   SpO2 100%   BMI 37.49 kg/m   Physical Exam  Constitutional: She appears well-developed and well-nourished. No distress.  HENT:  Head: Normocephalic and atraumatic.  The swelling of the lips or tongue, no swelling of the face. Good phonation, no drooling, no trismus, airway is patent  Eyes: Pupils are equal, round, and reactive to light. Conjunctivae and EOM are normal. Right eye exhibits no discharge. Left eye exhibits no discharge.  Neck: Normal range of motion. Neck supple. No JVD present. No tracheal deviation present.  Cardiovascular: Normal rate, regular rhythm, normal heart sounds and intact distal pulses.   Pulmonary/Chest: Effort normal and breath sounds normal. No respiratory distress. She has no wheezes. She has no rales. She exhibits no tenderness.  Abdominal: Soft. Bowel sounds are normal. She exhibits no distension.  Musculoskeletal: She exhibits no edema.  Neurological: She is alert.  Skin: Skin is warm and dry. No rash noted. No erythema.  Psychiatric: She has a normal mood and affect. Her behavior is normal.  Nursing note and vitals reviewed.    ED Treatments / Results  Labs (all labs ordered are listed, but only abnormal results are displayed) Labs Reviewed - No data to display  EKG  EKG Interpretation None       Radiology No results found.  Procedures Procedures (including critical care time)  Medications Ordered in ED Medications  diphenhydrAMINE (BENADRYL) 25 mg capsule (50 mg  Given 09/11/17 2107)  dexamethasone (DECADRON) tablet 10 mg (10 mg Oral Given 09/11/17 2144)  famotidine (PEPCID) tablet 40 mg (40 mg Oral  Given 09/11/17 2143)     Initial Impression / Assessment and Plan / ED Course  I have reviewed the triage vital signs and the nursing notes.  Pertinent labs & imaging results that were available during my care of the patient were reviewed by me and considered in my medical decision making (see chart for details).      Patient presents with progressively improving pruritic rash after eating at a [place] [employer] restaurant earlier today. She has had a similar reaction after eating at the same [employer] last month. Afebrile, vital signs are stable. Patient was given Benadryl and Decadron prior to my assessment, and rash and throat tightness have entirely resolved on my examination. Airway is patent, and she is not complaining of any shortness of breath or throat tightness at this time. No evidence of angioedema or airway compromise. No other  evidence of anaphylaxis. She is tolerating PO fluids without difficulty. Suspect allergic reaction to something in the high bocce as this is happened in the past. She is stable for discharge home with Benadryl and refill of her EpiPen for peanut allergy which she states was recalled. Recommend follow-up with PCP or allergist for reevaluation. Discussed indications for return to the ED. Pt verbalized understanding of and agreement with plan and is safe for discharge home at this time.   Final Clinical Impressions(s) / ED Diagnoses   Final diagnoses:  Allergic reaction, initial encounter    New Prescriptions Discharge Medication List as of 09/11/2017 10:07 PM    START taking these medications   Details  diphenhydrAMINE (BENADRYL) 25 mg capsule Take 1 capsule (25 mg total) by mouth every 6 (six) hours as needed for itching or allergies., Starting Tue 09/11/2017, Print    EPINEPHrine 0.3 mg/0.3 mL IJ SOAJ injection Inject 0.3 mLs (0.3 mg total) into the muscle once., Starting Tue 09/11/2017, Print         Nidal Rivet, [place] A, PA-C 09/12/17 0116    Little,  Ambrose Finland, MD 09/12/17 1318

## 2017-09-11 NOTE — Discharge Instructions (Signed)
Take Benadryl every 6 hours as needed for itching. Follow-up with primary care physician or allergist for evaluation of what you may be allergic to. Return to the ED immediately if any concerning signs or symptoms develop such as worsening swelling, throat tightness, rash, fevers, shortness of breath, drooling, or vomiting

## 2017-09-11 NOTE — ED Triage Notes (Signed)
Allergic reaction. States 30 minutes ago while eating [employer] she started breaking out in a rash. Her throat is itching. No distress. No hoarseness.

## 2017-09-11 NOTE — ED Notes (Signed)
Pt advised to stop eating at Asian restaurants as this is the second time she has had a reaction in the last month.  Pt verbalizes understanding.

## 2018-02-05 ENCOUNTER — Emergency Department (HOSPITAL_BASED_OUTPATIENT_CLINIC_OR_DEPARTMENT_OTHER): Payer: Medicaid Other

## 2018-02-05 ENCOUNTER — Other Ambulatory Visit: Payer: Self-pay

## 2018-02-05 ENCOUNTER — Emergency Department (HOSPITAL_BASED_OUTPATIENT_CLINIC_OR_DEPARTMENT_OTHER)
Admission: EM | Admit: 2018-02-05 | Discharge: 2018-02-05 | Disposition: A | Discharge status: Home / Self Care | Payer: Medicaid Other | Attending: Emergency Medicine | Admitting: Emergency Medicine

## 2018-02-05 ENCOUNTER — Encounter (HOSPITAL_BASED_OUTPATIENT_CLINIC_OR_DEPARTMENT_OTHER): Payer: Self-pay | Admitting: *Deleted

## 2018-02-05 DIAGNOSIS — M898X1 Other specified disorders of bone, shoulder: Secondary | ICD-10-CM

## 2018-02-05 DIAGNOSIS — M79601 Pain in right arm: Secondary | ICD-10-CM | POA: Diagnosis present

## 2018-02-05 DIAGNOSIS — I10 Essential (primary) hypertension: Secondary | ICD-10-CM | POA: Diagnosis not present

## 2018-02-05 MED ORDER — METHOCARBAMOL 500 MG PO TABS: 500.0000 mg | ORAL_TABLET | Freq: Two times a day (BID) | ORAL | 0 refills | Status: DC

## 2018-02-05 NOTE — Discharge Instructions (Signed)
X-ray was reassuring.  Please take 800 mg ibuprofen every 6 hours for pain.  I have written you a prescription for muscle relaxer medicine called Robaxin.  This medicine can make you drowsy so please do not drive or work while taking it.  He can also apply heat to the shoulder to help with your symptoms.

## 2018-02-05 NOTE — ED Notes (Signed)
Pt teaching provided on medications that may cause drowsiness. Pt instructed not to drive or operate heavy machinery while taking the prescribed medication. Pt verbalized understanding.   

## 2018-02-05 NOTE — ED Triage Notes (Signed)
Pt reports inability to move her right arm x 3 days.  Noted to move the arm to take her jacket off. Denies known injury.

## 2018-02-05 NOTE — ED Notes (Signed)
ED Provider at bedside. 

## 2018-02-05 NOTE — ED Provider Notes (Signed)
[facility] EMERGENCY DEPARTMENT Provider Note   CSN: [id number] Arrival date & time: 02/05/18  1835     History   Chief Complaint Chief Complaint  Patient presents with  . Arm Pain    HPI Nichole Bush is a 22 y.o. female.  HPI   Nichole Bush is a [place] female with a history of HTN, constipation and migraines who presents to the emergency department for evaluation of right shoulder pain.  Patient states that her pain began 3 days ago.  Denies inciting injury.  Pain is located over the right scapula.  She reports that her pain is 7/10 in severity and feels dull and aching in nature.  Occasionally pain becomes sharp and radiates down her right arm without known trigger.  Her pain is worse with any type of movement of the shoulder joint.  She tried taking Aleve without significant improvement in her pain.  She denies fever, chills, numbness, weakness, rash, arthralgias elsewhere.   Past Medical History:  Diagnosis Date  . Constipation   . Hypertension   . Migraine   . Thyroid disease     Patient Active Problem List   Diagnosis Date Noted  . Constipation 04/22/2015  . CN (constipation)     Past Surgical History:  Procedure Laterality Date  . ESOPHAGOGASTRODUODENOSCOPY      OB History    No data available       Home Medications    Prior to Admission medications   Not on File    Family History Family History  Problem Relation Age of Onset  . Migraines Father   . Hypertension Father     Social History Social History   Tobacco Use  . Smoking status: Never Smoker  . Smokeless tobacco: Never Used  Substance Use Topics  . Alcohol use: No    Alcohol/week: 0.0 oz  . Drug use: No     Allergies   Peanut-containing drug products and Penicillins   Review of Systems Review of Systems  Constitutional: Negative for chills and fever.  Musculoskeletal: Positive for arthralgias (right shoulder). Negative for joint swelling and neck pain.  Skin:  Negative for color change and rash.  Neurological: Negative for weakness and numbness.     Physical Exam Updated Vital Signs BP 130/76 (BP Location: Right Arm)   Pulse 73   Temp 98.1 F (36.7 C) (Oral)   Resp 18   Ht 5\' 2"  (1.575 m)   Wt 90.7 kg (200 lb)   LMP 01/13/2018   SpO2 100%   BMI 36.58 kg/m   Physical Exam  Constitutional: She is oriented to person, place, and time. She appears well-developed and well-nourished. No distress.  Sitting at bedside in no apparent distress.   HENT:  Head: Normocephalic and atraumatic.  Eyes: Right eye exhibits no discharge. Left eye exhibits no discharge.  Pulmonary/Chest: Effort normal. No respiratory distress.  Musculoskeletal:       Arms: Right shoulder with tenderness to palpation inferior to the scapular spine as depicted in image. Full active ROM, although painful. Negative empty can test, negative Neer's, negative lift off. No swelling, erythema or ecchymosis present. No step-off, crepitus, or deformity appreciated. 5/5 muscle strength of UE. 2+ radial pulse, sensation to light touch intact in bilateral distal UE and all compartments soft.   Neurological: She is alert and oriented to person, place, and time. Coordination normal.  Skin: Skin is warm and dry. Capillary refill takes less than 2 seconds. She is not diaphoretic.  Psychiatric: She has a normal mood and affect. Her behavior is normal.  Nursing note and vitals reviewed.    ED Treatments / Results  Labs (all labs ordered are listed, but only abnormal results are displayed) Labs Reviewed - No data to display  EKG  EKG Interpretation None       Radiology Dg Shoulder Right  Result Date: 02/05/2018 CLINICAL DATA:  22 y/o  F; right shoulder shooting pain for 3 days. EXAM: RIGHT SHOULDER - 2+ VIEW COMPARISON:  None. FINDINGS: There is no evidence of fracture or dislocation. There is no evidence of arthropathy or other focal bone abnormality. Soft tissues are  unremarkable. IMPRESSION: Negative. Electronically Signed   By: Mitzi HansenLance  Furusawa-Stratton M.D.   On: 02/05/2018 20:07    Procedures Procedures (including critical care time)  Medications Ordered in ED Medications - No data to display   Initial Impression / Assessment and Plan / ED Course  I have reviewed the triage vital signs and the nursing notes.  Pertinent labs & imaging results that were available during my care of the patient were reviewed by me and considered in my medical decision making (see chart for details).     X-ray negative for acute fracture or abnormality.  Patient's tenderness is located inferior to the scapular spine, over trapezius muscle.  Likely musculoskeletal strain vs cervical radiculopathy given pain shoots to right arm at times. No erythema, warmth, induration or evidence of infection over the back. She is neurovascularly intact. Full ROM. Plan to treat symptoms with heat, NSAIDs and muscle relaxer.  Counseled patient that muscle relaxer can make her drowsy and she should not drive or work while taking it. Counseled her to follow up with PCP if her symptoms are not improving in a week. Discussed return precautions and she agrees and voices understanding to this plan.    Final Clinical Impressions(s) / ED Diagnoses   Final diagnoses:  Pain of right scapula    ED Discharge Orders        Ordered    methocarbamol (ROBAXIN) 500 MG tablet  2 times daily     02/05/18 2032       Kellie ShropshireShrosbree, Meredeth Furber J, PA-C 02/06/18 [id number]    Arby BarrettePfeiffer, Marcy, MD 02/09/18 [phone]

## 2018-02-20 ENCOUNTER — Other Ambulatory Visit: Payer: Self-pay

## 2018-02-20 ENCOUNTER — Emergency Department (HOSPITAL_BASED_OUTPATIENT_CLINIC_OR_DEPARTMENT_OTHER)
Admission: EM | Admit: 2018-02-20 | Discharge: 2018-02-20 | Disposition: A | Discharge status: Home / Self Care | Payer: Medicaid Other | Attending: Emergency Medicine | Admitting: Emergency Medicine

## 2018-02-20 ENCOUNTER — Encounter (HOSPITAL_BASED_OUTPATIENT_CLINIC_OR_DEPARTMENT_OTHER): Payer: Self-pay | Admitting: Emergency Medicine

## 2018-02-20 DIAGNOSIS — R21 Rash and other nonspecific skin eruption: Secondary | ICD-10-CM | POA: Diagnosis present

## 2018-02-20 DIAGNOSIS — I10 Essential (primary) hypertension: Secondary | ICD-10-CM | POA: Diagnosis not present

## 2018-02-20 MED ORDER — DIPHENHYDRAMINE HCL 25 MG PO CAPS
25.0000 mg | ORAL_CAPSULE | Freq: Once | ORAL | Status: AC
  Administered 2018-02-20: 25 mg via ORAL
  Filled 2018-02-20: qty 1

## 2018-02-20 NOTE — Discharge Instructions (Signed)
You can take Tylenol or Ibuprofen as directed for pain. You can alternate Tylenol and Ibuprofen every 4 hours. If you take Tylenol at 1pm, then you can take Ibuprofen at 5pm. Then you can take Tylenol again at 9pm.   Take Benadryl as needed for symptoms.   Follow up with the referred dermatologist for further evaluation.  Stop using the laundry detergent.  Return emergency department for any spreading or worsening of the rash, fevers, difficulty breathing, difficulty swallowing.

## 2018-02-20 NOTE — ED Provider Notes (Signed)
[facility] EMERGENCY DEPARTMENT Provider Note   CSN: [id number] Arrival date & time: 02/20/18  2050     History   Chief Complaint Chief Complaint  Patient presents with  . Insect Bite    HPI Nichole Bush is a 22 y.o. female who presents for evaluation of rash to bilateral lower extremity who has been ongoing for the last 1 week.  Patient reports that prior to onset of rash, she had used a new detergent which she never used previously.  Patient states that rash is contained to the bilateral lower extremities.  She states that it has not been pruritic or painful.  But has persisted.  She has not taken any medications for the symptoms.  Patient states that nobody else in the house has similar rash.  Patient states that she has not had any other new exposures, foods, lotions, soaps.  Patient does not have any history of allergies to any substances.  Patient denies any fever, chest pain, difficulty breathing, swelling of her legs, numbness/weakness.  The history is provided by the patient.    Past Medical History:  Diagnosis Date  . Constipation   . Hypertension   . Migraine   . Thyroid disease     Patient Active Problem List   Diagnosis Date Noted  . Constipation 04/22/2015  . CN (constipation)     Past Surgical History:  Procedure Laterality Date  . ESOPHAGOGASTRODUODENOSCOPY      OB History   None      Home Medications    Prior to Admission medications   Medication Sig Start Date End Date Taking? Authorizing Provider  methocarbamol (ROBAXIN) 500 MG tablet Take 1 tablet (500 mg total) by mouth 2 (two) times daily. 02/05/18   Kellie Shropshire, PA-C    Family History Family History  Problem Relation Age of Onset  . Migraines Father   . Hypertension Father     Social History Social History   Tobacco Use  . Smoking status: Never Smoker  . Smokeless tobacco: Never Used  Substance Use Topics  . Alcohol use: No    Alcohol/week: 0.0 oz  . Drug  use: No     Allergies   Peanut-containing drug products and Penicillins   Review of Systems Review of Systems  Constitutional: Negative for fever.  Respiratory: Negative for shortness of breath.   Cardiovascular: Negative for chest pain.  Skin: Positive for rash.  Neurological: Negative for weakness and numbness.     Physical Exam Updated Vital Signs BP 118/83 (BP Location: Right Arm)   Pulse 82   Temp 98.2 F (36.8 C) (Oral)   Resp 18   Ht 5\' 2"  (1.575 m)   Wt 95.3 kg (210 lb)   LMP 01/05/2018   SpO2 100%   BMI 38.41 kg/m   Physical Exam  Constitutional: She appears well-developed and well-nourished.  HENT:  Head: Normocephalic and atraumatic.  No oral lesions. Airway patent, phonation intact  Eyes: Conjunctivae and EOM are normal. Right eye exhibits no discharge. Left eye exhibits no discharge. No scleral icterus.  Cardiovascular:  Pulses:      Radial pulses are 2+ on the right side, and 2+ on the left side.       Dorsalis pedis pulses are 2+ on the right side, and 2+ on the left side.  Pulmonary/Chest: Effort normal.  Neurological: She is alert.  Sensation intact along major nerve distributions of BLE  Skin: Skin is warm and dry. Capillary refill takes less  than 2 seconds. Rash noted. No petechiae and no purpura noted.  Diffusely scattered erythematous papules to the bilateral lower extremities with small areas of confluent erythematous patches.  No rash noted to palms or soles of feet. Good distal cap refill.  BLE are not dusky in appearance or cool to touch.  Psychiatric: She has a normal mood and affect. Her speech is normal and behavior is normal.  Nursing note and vitals reviewed.        ED Treatments / Results  Labs (all labs ordered are listed, but only abnormal results are displayed) Labs Reviewed - No data to display  EKG  EKG Interpretation None       Radiology No results found.  Procedures Procedures (including critical care  time)  Medications Ordered in ED Medications  diphenhydrAMINE (BENADRYL) capsule 25 mg (25 mg Oral Given 02/20/18 2309)     Initial Impression / Assessment and Plan / ED Course  I have reviewed the triage vital signs and the nursing notes.  Pertinent labs & imaging results that were available during my care of the patient were reviewed by me and considered in my medical decision making (see chart for details).     22 y.o. female who presents for evaluation of rash to bilateral lower extremities times 1 week.  Patient reports that she did use a new laundry detergent prior to onset of symptoms. Patient states that over the else in the house has a rash.  She does not recall any insect bite.  Rash is not pruritic or painful.  No fevers, chest pain, difficulty breathing.  No other new exposures. Patient is afebrile, non-toxic appearing, sitting comfortably on examination table. Vital signs reviewed and stable.  On exam, patient has diffusely scattered small erythematous papules throughout the entire bilateral lower extremities.  There are several areas where there is complete erythematous areas.  No warmth, induration noted.  No rash noted to palms or soles of feet.  Consider contact dermatitis versus reactive urticaria.  History/physical exam is not concerning for petechiae associated with meningitis.  History/physical exam is not concerning for SJS, TENS, syphilis. Discussed patient with Dr. Criss AlvineGoldston who is agreeable to plan.  Encourage supportive at home therapies with patient.  Will give outpatient dermatology referral if symptoms do not improve.  Patient had ample opportunity for questions and discussion. All patient's questions were answered with full understanding. Strict return precautions discussed. Patient expresses understanding and agreement to plan.   Final Clinical Impressions(s) / ED Diagnoses   Final diagnoses:  Rash    ED Discharge Orders    None       Maxwell CaulLayden, Lindsey A,  PA-C 02/21/18 1816    Pricilla LovelessGoldston, Scott, MD 02/21/18 [phone]

## 2018-02-20 NOTE — ED Notes (Signed)
Pt verbalizes understanding of d/c instructions and denies any further need at this time. 

## 2018-02-20 NOTE — ED Triage Notes (Signed)
Patient has multiple bites all over her legs

## 2018-02-26 DIAGNOSIS — D690 Allergic purpura: Secondary | ICD-10-CM | POA: Insufficient documentation

## 2018-03-06 ENCOUNTER — Encounter (HOSPITAL_BASED_OUTPATIENT_CLINIC_OR_DEPARTMENT_OTHER): Payer: Self-pay | Admitting: Emergency Medicine

## 2018-03-06 ENCOUNTER — Other Ambulatory Visit: Payer: Self-pay

## 2018-03-06 ENCOUNTER — Emergency Department (HOSPITAL_BASED_OUTPATIENT_CLINIC_OR_DEPARTMENT_OTHER)
Admission: EM | Admit: 2018-03-06 | Discharge: 2018-03-06 | Disposition: A | Discharge status: Home / Self Care | Payer: Medicaid Other | Attending: Emergency Medicine | Admitting: Emergency Medicine

## 2018-03-06 DIAGNOSIS — Z91010 Allergy to peanuts: Secondary | ICD-10-CM | POA: Insufficient documentation

## 2018-03-06 DIAGNOSIS — B379 Candidiasis, unspecified: Secondary | ICD-10-CM

## 2018-03-06 DIAGNOSIS — I10 Essential (primary) hypertension: Secondary | ICD-10-CM | POA: Insufficient documentation

## 2018-03-06 DIAGNOSIS — R102 Pelvic and perineal pain unspecified side: Secondary | ICD-10-CM

## 2018-03-06 DIAGNOSIS — B9689 Other specified bacterial agents as the cause of diseases classified elsewhere: Secondary | ICD-10-CM

## 2018-03-06 DIAGNOSIS — E079 Disorder of thyroid, unspecified: Secondary | ICD-10-CM | POA: Insufficient documentation

## 2018-03-06 DIAGNOSIS — N760 Acute vaginitis: Secondary | ICD-10-CM | POA: Diagnosis not present

## 2018-03-06 LAB — PREGNANCY, URINE: Preg Test, Ur: NEGATIVE

## 2018-03-06 LAB — WET PREP, GENITAL
SPERM: NONE SEEN
TRICH WET PREP: NONE SEEN

## 2018-03-06 MED ORDER — METRONIDAZOLE 500 MG PO TABS: 500.0000 mg | ORAL_TABLET | Freq: Two times a day (BID) | ORAL | 0 refills | Status: AC

## 2018-03-06 MED ORDER — FLUCONAZOLE 150 MG PO TABS: 150.0000 mg | ORAL_TABLET | Freq: Every day | ORAL | 0 refills | Status: DC

## 2018-03-06 MED ORDER — FLUCONAZOLE 50 MG PO TABS
150.0000 mg | ORAL_TABLET | Freq: Once | ORAL | Status: AC
  Administered 2018-03-06: 150 mg via ORAL
  Filled 2018-03-06: qty 1

## 2018-03-06 NOTE — ED Provider Notes (Signed)
[facility] EMERGENCY DEPARTMENT Provider Note   CSN: [id number] Arrival date & time: 03/06/18  0417     History   Chief Complaint Chief Complaint  Patient presents with  . Vaginal Pain    HPI Nichole Bush is a 22 y.o. female.  HPI  Reports swelling after intercourse yesterday Burning and sharp pain, intravaginally Reports feels as if there was soap there irritating No abdominal pain, no vomiting, no dysuria, no vaginal discharge Reports intercourse which was rough, began to have pain towards end, no other toys or objects No concern for safety with boyfriend Not using protection   Past Medical History:  Diagnosis Date  . Constipation   . Hypertension   . Migraine   . Thyroid disease     Patient Active Problem List   Diagnosis Date Noted  . Constipation 04/22/2015  . CN (constipation)     Past Surgical History:  Procedure Laterality Date  . ESOPHAGOGASTRODUODENOSCOPY       OB History   None      Home Medications    Prior to Admission medications   Medication Sig Start Date End Date Taking? Authorizing Provider  fluconazole (DIFLUCAN) 150 MG tablet Take 1 tablet (150 mg total) by mouth daily. Take in 7 days 03/06/18   Alvira Monday, MD  methocarbamol (ROBAXIN) 500 MG tablet Take 1 tablet (500 mg total) by mouth 2 (two) times daily. 02/05/18   Kellie Shropshire, PA-C  metroNIDAZOLE (FLAGYL) 500 MG tablet Take 1 tablet (500 mg total) by mouth 2 (two) times daily for 7 days. 03/06/18 03/13/18  Alvira Monday, MD    Family History Family History  Problem Relation Age of Onset  . Migraines Father   . Hypertension Father     Social History Social History   Tobacco Use  . Smoking status: Never Smoker  . Smokeless tobacco: Never Used  Substance Use Topics  . Alcohol use: No    Alcohol/week: 0.0 oz  . Drug use: No     Allergies   Peanut-containing drug products and Penicillins   Review of Systems Review of Systems    Constitutional: Negative for fever.  Gastrointestinal: Negative for abdominal pain, nausea and vomiting.  Genitourinary: Positive for vaginal pain. Negative for dysuria, flank pain, vaginal bleeding and vaginal discharge.  Skin: Negative for rash and wound.     Physical Exam Updated Vital Signs BP 119/86 (BP Location: Left Arm)   Pulse 74   Temp 98.3 F (36.8 C) (Oral)   Resp 16   Ht 5\' 2"  (1.575 m)   Wt 95.3 kg (210 lb)   SpO2 97%   BMI 38.41 kg/m   Physical Exam  Constitutional: She is oriented to person, place, and time. She appears well-developed and well-nourished. No distress.  HENT:  Head: Normocephalic and atraumatic.  Eyes: Conjunctivae and EOM are normal.  Neck: Normal range of motion.  Cardiovascular: Normal rate, regular rhythm and intact distal pulses.  Pulmonary/Chest: Effort normal. No respiratory distress.  Abdominal: Soft. She exhibits no distension. There is no tenderness. There is no guarding.  Genitourinary: Cervix exhibits discharge. Cervix exhibits no motion tenderness. Vaginal discharge found.  Musculoskeletal: She exhibits no edema or tenderness.  Neurological: She is alert and oriented to person, place, and time.  Skin: Skin is warm and dry. No rash noted. She is not diaphoretic. No erythema.  Nursing note and vitals reviewed.    ED Treatments / Results  Labs (all labs ordered are listed, but only  abnormal results are displayed) Labs Reviewed  WET PREP, GENITAL - Abnormal; Notable for the following components:      Result Value   Yeast Wet Prep HPF POC PRESENT (*)    Clue Cells Wet Prep HPF POC PRESENT (*)    WBC, Wet Prep HPF POC MANY (*)    All other components within normal limits  PREGNANCY, URINE  GC/CHLAMYDIA PROBE AMP ([place]) NOT AT [facility]    EKG None  Radiology No results found.  Procedures Procedures (including critical care time)  Medications Ordered in ED Medications  fluconazole (DIFLUCAN) tablet 150 mg (150 mg  Oral Given 03/06/18 0453)     Initial Impression / Assessment and Plan / ED Course  I have reviewed the triage vital signs and the nursing notes.  Pertinent labs & imaging results that were available during my care of the patient were reviewed by me and considered in my medical decision making (see chart for details).     22yo female presents with concern for vaginal pain and swelling after intercourse yesterday. Pregnancy test negative.  Offered empiric GC/Chl treatment, pt would like to wait until she sees her OBGYN.  Discussed that vaginitis, pain/swelling may be related to intercourse itself, allergy versus possible yeast or BV.   Wet prep shows BV and yeast.  Given flagyl, fluconazole. Patient discharged in stable condition with understanding of reasons to return.     Final Clinical Impressions(s) / ED Diagnoses   Final diagnoses:  Vaginal pain  Bacterial vaginosis  Yeast infection    ED Discharge Orders        Ordered    metroNIDAZOLE (FLAGYL) 500 MG tablet  2 times daily     03/06/18 0451    fluconazole (DIFLUCAN) 150 MG tablet  Daily     03/06/18 0451       Alvira MondaySchlossman, Trinten Boudoin, MD 03/06/18 [phone]

## 2018-03-06 NOTE — ED Triage Notes (Signed)
Pt c/o swelling and pain to vagina after intercourse yesterday

## 2018-03-07 LAB — GC/CHLAMYDIA PROBE AMP (~~LOC~~) NOT AT ARMC
Chlamydia: NEGATIVE
Neisseria Gonorrhea: NEGATIVE

## 2018-04-15 ENCOUNTER — Emergency Department (HOSPITAL_BASED_OUTPATIENT_CLINIC_OR_DEPARTMENT_OTHER)
Admission: EM | Admit: 2018-04-15 | Discharge: 2018-04-15 | Disposition: A | Discharge status: Home / Self Care | Payer: Medicaid Other | Attending: Emergency Medicine | Admitting: Emergency Medicine

## 2018-04-15 ENCOUNTER — Encounter (HOSPITAL_BASED_OUTPATIENT_CLINIC_OR_DEPARTMENT_OTHER): Payer: Self-pay

## 2018-04-15 ENCOUNTER — Other Ambulatory Visit: Payer: Self-pay

## 2018-04-15 DIAGNOSIS — I776 Arteritis, unspecified: Secondary | ICD-10-CM

## 2018-04-15 DIAGNOSIS — I10 Essential (primary) hypertension: Secondary | ICD-10-CM | POA: Diagnosis not present

## 2018-04-15 DIAGNOSIS — L959 Vasculitis limited to the skin, unspecified: Secondary | ICD-10-CM | POA: Diagnosis not present

## 2018-04-15 DIAGNOSIS — Z91010 Allergy to peanuts: Secondary | ICD-10-CM | POA: Diagnosis not present

## 2018-04-15 DIAGNOSIS — R2243 Localized swelling, mass and lump, lower limb, bilateral: Secondary | ICD-10-CM | POA: Diagnosis present

## 2018-04-15 LAB — PROTIME-INR
INR: 0.99
Prothrombin Time: 13 seconds (ref 11.4–15.2)

## 2018-04-15 LAB — CBC WITH DIFFERENTIAL/PLATELET
BASOS ABS: 0 10*3/uL (ref 0.0–0.1)
BASOS PCT: 0 %
EOS PCT: 8 %
Eosinophils Absolute: 0.6 10*3/uL (ref 0.0–0.7)
HCT: 39.6 % (ref 36.0–46.0)
Hemoglobin: 14.2 g/dL (ref 12.0–15.0)
Lymphocytes Relative: 46 %
Lymphs Abs: 3.7 10*3/uL (ref 0.7–4.0)
MCH: 31.6 pg (ref 26.0–34.0)
MCHC: 35.9 g/dL (ref 30.0–36.0)
MCV: 88.2 fL (ref 78.0–100.0)
MONO ABS: 0.5 10*3/uL (ref 0.1–1.0)
Monocytes Relative: 6 %
NEUTROS ABS: 3.2 10*3/uL (ref 1.7–7.7)
Neutrophils Relative %: 40 %
PLATELETS: 373 10*3/uL (ref 150–400)
RBC: 4.49 MIL/uL (ref 3.87–5.11)
RDW: 12.3 % (ref 11.5–15.5)
WBC: 8 10*3/uL (ref 4.0–10.5)

## 2018-04-15 LAB — BASIC METABOLIC PANEL
ANION GAP: 8 (ref 5–15)
BUN: 7 mg/dL (ref 6–20)
CALCIUM: 9.4 mg/dL (ref 8.9–10.3)
CO2: 26 mmol/L (ref 22–32)
Chloride: 104 mmol/L (ref 101–111)
Creatinine, Ser: 0.72 mg/dL (ref 0.44–1.00)
Glucose, Bld: 91 mg/dL (ref 65–99)
Potassium: 3.3 mmol/L — ABNORMAL LOW (ref 3.5–5.1)
Sodium: 138 mmol/L (ref 135–145)

## 2018-04-15 LAB — PREGNANCY, URINE: PREG TEST UR: NEGATIVE

## 2018-04-15 MED ORDER — KETOROLAC TROMETHAMINE 60 MG/2ML IM SOLN
30.0000 mg | Freq: Once | INTRAMUSCULAR | Status: AC
  Administered 2018-04-15: 30 mg via INTRAMUSCULAR
  Filled 2018-04-15: qty 2

## 2018-04-15 MED ORDER — PREDNISONE 50 MG PO TABS
60.0000 mg | ORAL_TABLET | Freq: Once | ORAL | Status: AC
  Administered 2018-04-15: 60 mg via ORAL
  Filled 2018-04-15: qty 1

## 2018-04-15 MED ORDER — NAPROXEN 500 MG PO TABS: 500.0000 mg | ORAL_TABLET | Freq: Two times a day (BID) | ORAL | 0 refills | Status: DC

## 2018-04-15 MED ORDER — SODIUM CHLORIDE 0.9 % IV BOLUS
1000.0000 mL | Freq: Once | INTRAVENOUS | Status: AC
  Administered 2018-04-15: 1000 mL via INTRAVENOUS

## 2018-04-15 MED ORDER — DOXYCYCLINE HYCLATE 100 MG PO CAPS: 100.0000 mg | ORAL_CAPSULE | Freq: Two times a day (BID) | ORAL | 0 refills | Status: AC

## 2018-04-15 MED ORDER — PREDNISONE 20 MG PO TABS: 60.0000 mg | ORAL_TABLET | Freq: Every day | ORAL | 0 refills | Status: AC

## 2018-04-15 NOTE — ED Provider Notes (Signed)
[facility] EMERGENCY DEPARTMENT Provider Note   CSN: [id number] Arrival date & time: 04/15/18  1654     History   Chief Complaint Chief Complaint  Patient presents with  . Foot Swelling    HPI Nichole Bush is a 22 y.o. female.  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  The patient states her symptoms started several days ago.  The symptoms began gradually with rash on her bilateral legs.  This rash and spread and she is developed aching, throbbing, bilateral ankle and knee pain.  She had associated redness of her left foot.  The symptoms feel similar to her previous episodes of HSP.  She denies any abdominal pain, nausea, or vomiting.  She has not previously seen a specialist for this.  Denies any cough or shortness of breath.  No fevers.  No headache or neck stiffness.  Symptoms are worse with weightbearing and movement.  No alleviating factors.  Past Medical History:  Diagnosis Date  . Constipation   . Hypertension   . Migraine   . Thyroid disease     Patient Active Problem List   Diagnosis Date Noted  . Constipation 04/22/2015  . CN (constipation)     Past Surgical History:  Procedure Laterality Date  . ESOPHAGOGASTRODUODENOSCOPY       OB History   None      Home Medications    Prior to Admission medications   Medication Sig Start Date End Date Taking? Authorizing Provider  doxycycline (VIBRAMYCIN) 100 MG capsule Take 1 capsule (100 mg total) by mouth 2 (two) times daily for 7 days. 04/15/18 04/22/18  Shaune Pollack, MD  fluconazole (DIFLUCAN) 150 MG tablet Take 1 tablet (150 mg total) by mouth daily. Take in 7 days 03/06/18   Alvira Monday, MD  methocarbamol (ROBAXIN) 500 MG tablet Take 1 tablet (500 mg total) by mouth 2 (two) times daily. 02/05/18   Kellie Shropshire, PA-C  naproxen (NAPROSYN) 500 MG tablet Take 1 tablet (500 mg total) by mouth 2 (two) times daily with a  meal for 7 days. 04/15/18 04/22/18  Shaune Pollack, MD  predniSONE (DELTASONE) 20 MG tablet Take 3 tablets (60 mg total) by mouth daily for 5 days. 04/15/18 04/20/18  Shaune Pollack, MD    Family History Family History  Problem Relation Age of Onset  . Migraines Father   . Hypertension Father     Social History Social History   Tobacco Use  . Smoking status: Never Smoker  . Smokeless tobacco: Never Used  Substance Use Topics  . Alcohol use: No    Alcohol/week: 0.0 oz  . Drug use: No     Allergies   Peanut-containing drug products and Penicillins   Review of Systems Review of Systems  Constitutional: Negative for chills and fever.  HENT: Negative for congestion, rhinorrhea and sore throat.   Eyes: Negative for visual disturbance.  Respiratory: Negative for cough, shortness of breath and wheezing.   Cardiovascular: Negative for chest pain and leg swelling.  Gastrointestinal: Negative for abdominal pain, diarrhea, nausea and vomiting.  Genitourinary: Negative for dysuria, flank pain, vaginal bleeding and vaginal discharge.  Musculoskeletal: Positive for arthralgias, back pain and myalgias. Negative for neck pain.  Skin: Positive for rash.  Allergic/Immunologic: Negative for immunocompromised state.  Neurological: Negative for syncope and headaches.  Hematological: Does not bruise/bleed easily.  All other systems reviewed and are negative.    Physical Exam Updated Vital  Signs BP 107/69 (BP Location: Right Arm)   Pulse 90   Temp 98.3 F (36.8 C) (Oral)   Resp 18   Ht  (1.575 m)   Wt 95.3 kg (210 lb)   LMP 04/05/2018   SpO2 100%   BMI 38.41 kg/m   Physical Exam  Constitutional: She is oriented to person, place, and time. She appears well-developed and well-nourished. No distress.  HENT:  Head: Normocephalic and atraumatic.  Eyes: Conjunctivae are normal.  Neck: Neck supple.  Cardiovascular: Normal rate, regular rhythm and normal heart sounds. Exam  reveals no friction rub.  No murmur heard. Pulmonary/Chest: Effort normal and breath sounds normal. No respiratory distress. She has no wheezes. She has no rales.  Abdominal: She exhibits no distension.  Musculoskeletal: She exhibits no edema.  Neurological: She is alert and oriented to person, place, and time. She exhibits normal muscle tone.  Skin: Skin is warm. Capillary refill takes less than 2 seconds.  Bilateral purpura extending from the thighs to the distal ankles, worse about the ankles on distal lower legs.  There is mild erythema overlying area of excoriation on the left ankle.  No joint erythema or warmth.  Psychiatric: She has a normal mood and affect.  Nursing note and vitals reviewed.    ED Treatments / Results  Labs (all labs ordered are listed, but only abnormal results are displayed) Labs Reviewed  BASIC METABOLIC PANEL - Abnormal; Notable for the following components:      Result Value   Potassium 3.3 (*)    All other components within normal limits  CBC WITH DIFFERENTIAL/PLATELET  PROTIME-INR  PREGNANCY, URINE    EKG None  Radiology No results found.  Procedures Procedures (including critical care time)  Medications Ordered in ED Medications  sodium chloride 0.9 % bolus 1,000 mL (1,000 mLs Intravenous New Bag/Given 04/15/18 1959)  ketorolac (TORADOL) injection 30 mg (has no administration in time range)  predniSONE (DELTASONE) tablet 60 mg (has no administration in time range)     Initial Impression / Assessment and Plan / ED Course  I have reviewed the triage vital signs and the nursing notes.  Pertinent labs & imaging results that were available during my care of the patient were reviewed by me and considered in my medical decision making (see chart for details).    22 yo F here with recurrent bilateral purpura/leg pain. H/o HSP with similar sx. On arrival, VSS and WNL. No HA, neck stiffness, or other signs of meningitis or encephalitis and she is  very well-appearing and in NAD. Screening labs are unremarkable - WBC normal, platelts and INR/coags all WNL. Doubt TTP, ITP. No signs of Neisseria or systemic illness/sepsis or DIC. Pt feels markedly improved with steroids, fluids, NSAIDs here. She does have a possible superimposed cellulitis of L ankle. Will tx with doxy, prednisone/NSAIDs, and outpt referral.  Final Clinical Impressions(s) / ED Diagnoses   Final diagnoses:  Vasculitis [facility])    ED Discharge Orders        Ordered    predniSONE (DELTASONE) 20 MG tablet  Daily     04/15/18 2045    doxycycline (VIBRAMYCIN) 100 MG capsule  2 times daily     04/15/18 2045    naproxen (NAPROSYN) 500 MG tablet  2 times daily with meals     04/15/18 2045       Shaune Pollack, MD 04/15/18 2050

## 2018-04-15 NOTE — ED Triage Notes (Signed)
C/o swelling to left foot x 3 days-denies injury-denies hx of same-NAD-limping gait

## 2018-04-15 NOTE — Discharge Instructions (Addendum)
Follow-up with a PRIMARY CARE DOCTOR in 5-7 days  I'd also recommend setting up an appointment with a RHEUMATOLOGIST, or immune specialist, for your recurrent vasculitis  Drink plenty of fluids

## 2018-04-15 NOTE — ED Notes (Signed)
Elevated red rashes to BLE.  Left foot is swollen and painful when she stands up.

## 2018-04-15 NOTE — ED Notes (Signed)
ED Provider at bedside. 

## 2018-04-19 ENCOUNTER — Other Ambulatory Visit: Payer: Self-pay

## 2018-04-19 ENCOUNTER — Encounter (HOSPITAL_BASED_OUTPATIENT_CLINIC_OR_DEPARTMENT_OTHER): Payer: Self-pay | Admitting: Emergency Medicine

## 2018-04-19 ENCOUNTER — Emergency Department (HOSPITAL_BASED_OUTPATIENT_CLINIC_OR_DEPARTMENT_OTHER)
Admission: EM | Admit: 2018-04-19 | Discharge: 2018-04-20 | Disposition: A | Discharge status: Home / Self Care | Payer: Medicaid Other | Attending: Emergency Medicine | Admitting: Emergency Medicine

## 2018-04-19 DIAGNOSIS — R2243 Localized swelling, mass and lump, lower limb, bilateral: Secondary | ICD-10-CM | POA: Insufficient documentation

## 2018-04-19 DIAGNOSIS — Z79899 Other long term (current) drug therapy: Secondary | ICD-10-CM | POA: Insufficient documentation

## 2018-04-19 DIAGNOSIS — Z91010 Allergy to peanuts: Secondary | ICD-10-CM | POA: Diagnosis not present

## 2018-04-19 DIAGNOSIS — I776 Arteritis, unspecified: Secondary | ICD-10-CM | POA: Diagnosis not present

## 2018-04-19 DIAGNOSIS — I10 Essential (primary) hypertension: Secondary | ICD-10-CM | POA: Diagnosis not present

## 2018-04-19 DIAGNOSIS — E079 Disorder of thyroid, unspecified: Secondary | ICD-10-CM | POA: Diagnosis not present

## 2018-04-19 DIAGNOSIS — R21 Rash and other nonspecific skin eruption: Secondary | ICD-10-CM | POA: Diagnosis present

## 2018-04-19 HISTORY — DX: Allergic purpura: D69.0

## 2018-04-19 NOTE — ED Triage Notes (Signed)
Pt seen here on 04-15-18 for skin irritation, bilateral swelling and irritation (vasculitis).  Pt states it is not improving.

## 2018-04-20 ENCOUNTER — Encounter (HOSPITAL_BASED_OUTPATIENT_CLINIC_OR_DEPARTMENT_OTHER): Payer: Self-pay | Admitting: Emergency Medicine

## 2018-04-20 LAB — BASIC METABOLIC PANEL
Anion gap: 8 (ref 5–15)
BUN: 12 mg/dL (ref 6–20)
CALCIUM: 8.9 mg/dL (ref 8.9–10.3)
CO2: 24 mmol/L (ref 22–32)
Chloride: 106 mmol/L (ref 101–111)
Creatinine, Ser: 0.75 mg/dL (ref 0.44–1.00)
Glucose, Bld: 100 mg/dL — ABNORMAL HIGH (ref 65–99)
POTASSIUM: 3.4 mmol/L — AB (ref 3.5–5.1)
Sodium: 138 mmol/L (ref 135–145)

## 2018-04-20 LAB — CBC WITH DIFFERENTIAL/PLATELET
BASOS ABS: 0 10*3/uL (ref 0.0–0.1)
Basophils Relative: 0 %
Eosinophils Absolute: 0 10*3/uL (ref 0.0–0.7)
Eosinophils Relative: 0 %
HCT: 33.2 % — ABNORMAL LOW (ref 36.0–46.0)
Hemoglobin: 11.7 g/dL — ABNORMAL LOW (ref 12.0–15.0)
LYMPHS ABS: 7.6 10*3/uL — AB (ref 0.7–4.0)
Lymphocytes Relative: 42 %
MCH: 31.6 pg (ref 26.0–34.0)
MCHC: 35.2 g/dL (ref 30.0–36.0)
MCV: 89.7 fL (ref 78.0–100.0)
MONO ABS: 1.3 10*3/uL — AB (ref 0.1–1.0)
Monocytes Relative: 7 %
NEUTROS ABS: 9.1 10*3/uL — AB (ref 1.7–7.7)
Neutrophils Relative %: 51 %
PLATELETS: 357 10*3/uL (ref 150–400)
RBC: 3.7 MIL/uL — AB (ref 3.87–5.11)
RDW: 12.3 % (ref 11.5–15.5)
WBC: 18 10*3/uL — AB (ref 4.0–10.5)

## 2018-04-20 MED ORDER — METHYLPREDNISOLONE SODIUM SUCC 125 MG IJ SOLR
125.0000 mg | Freq: Once | INTRAMUSCULAR | Status: AC
  Administered 2018-04-20: 125 mg via INTRAVENOUS
  Filled 2018-04-20: qty 2

## 2018-04-20 MED ORDER — SODIUM CHLORIDE 0.9 % IV BOLUS
1000.0000 mL | Freq: Once | INTRAVENOUS | Status: AC
  Administered 2018-04-20: 1000 mL via INTRAVENOUS

## 2018-04-20 MED ORDER — METHYLPREDNISOLONE 4 MG PO TBPK: ORAL_TABLET | ORAL | 0 refills | Status: DC

## 2018-04-20 NOTE — ED Provider Notes (Addendum)
[facility] Provider Note: Lowella Dell, MD, FACEP  CSN: [id number] MRN: [id number] ARRIVAL: 04/19/18 at 2318 ROOM: [facility]07/[facility]07   CHIEF COMPLAINT  Skin Problem   HISTORY OF PRESENT ILLNESS  04/20/18 12:33 AM Nichole Bush is a 22 y.o. 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  She was seen in the ED on the 13th of this month and diagnosed with vasculitis.  She was placed on 5-day course of prednisone 60 mg daily; she was also placed on doxycycline for possible superimposed cellulitis.  She returns with persistent rash of her legs and feet.  The rash on her legs has improved but that on her feet has worsened.  She is also having swelling of her feet and an ulceration to her left lateral foot.  She denies abdominal pain, fever or joint pain.  Symptoms are moderate.  She has not followed up and is planning to move to [facility] in 4 days and states she will not have an opportunity to follow-up as an outpatient before then.   Past Medical History:  Diagnosis Date  . Constipation   . 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 Hypertension   . Migraine   . Thyroid disease     Past Surgical History:  Procedure Laterality Date  . ESOPHAGOGASTRODUODENOSCOPY      Family History  Problem Relation Age of Onset  . Migraines Father   . Hypertension Father     Social History   Tobacco Use  . Smoking status: Never Smoker  . Smokeless tobacco: Never Used  Substance Use Topics  . Alcohol use: No    Alcohol/week: 0.0 oz  . Drug use: No    Prior to Admission medications   Medication Sig Start Date End Date Taking? Authorizing Provider  doxycycline (VIBRAMYCIN) 100 MG capsule Take 1 capsule (100 mg total) by mouth 2 (two) times daily for 7 days. 04/15/18 04/22/18  Shaune Pollack, MD  fluconazole (DIFLUCAN) 150 MG tablet Take 1 tablet (150 mg total) by mouth daily. Take in 7 days 03/06/18   Alvira Monday, MD    methocarbamol (ROBAXIN) 500 MG tablet Take 1 tablet (500 mg total) by mouth 2 (two) times daily. 02/05/18   Kellie Shropshire, PA-C  naproxen (NAPROSYN) 500 MG tablet Take 1 tablet (500 mg total) by mouth 2 (two) times daily with a meal for 7 days. 04/15/18 04/22/18  Shaune Pollack, MD  predniSONE (DELTASONE) 20 MG tablet Take 3 tablets (60 mg total) by mouth daily for 5 days. 04/15/18 04/20/18  Shaune Pollack, MD    Allergies Peanut-containing drug products and Penicillins   REVIEW OF SYSTEMS  Negative except as noted here or in the History of Present Illness.   PHYSICAL EXAMINATION  Initial Vital Signs Blood pressure 100/74, pulse 64, temperature 98.1 F (36.7 C), resp. rate 20, height  (1.575 m), weight 95.3 kg (210 lb), last menstrual period 04/05/2018, SpO2 100 %.  Examination General: Well-developed, well-nourished female in no acute distress; appearance consistent with age of record HENT: normocephalic; atraumatic Eyes: pupils equal, round and reactive to light; extraocular muscles intact Neck: supple Heart: regular rate and rhythm Lungs: clear to auscultation bilaterally Abdomen: soft; nondistended; nontender; bowel sounds present Extremities: No deformity; full range of motion; pulses normal Neurologic: Awake, alert and oriented; motor function intact in all extremities and symmetric; no facial droop Skin: Warm and dry; palpable.  Periodic rash of the  legs and feet, more prominent on the feet and ankles; several shallow ulcerations of the skin of the feet; mild edema of the feet:     Psychiatric: Normal mood and affect   RESULTS  Summary of this visit's results, reviewed by myself:   EKG Interpretation  Date/Time:    Ventricular Rate:    PR Interval:    QRS Duration:   QT Interval:    QTC Calculation:   R Axis:     Text Interpretation:        Laboratory Studies: Results for orders placed or performed during the hospital encounter of 04/19/18 (from the  past 24 hour(s))  CBC with Differential/Platelet     Status: Abnormal   Collection Time: 04/20/18  1:24 AM  Result Value Ref Range   WBC 18.0 (H) 4.0 - 10.5 K/uL   RBC 3.70 (L) 3.87 - 5.11 MIL/uL   Hemoglobin 11.7 (L) 12.0 - 15.0 g/dL   HCT 09.8 (L) 11.9 - 14.7 %   MCV 89.7 78.0 - 100.0 fL   MCH 31.6 26.0 - 34.0 pg   MCHC 35.2 30.0 - 36.0 g/dL   RDW 82.9 56.2 - 13.0 %   Platelets 357 150 - 400 K/uL   Neutrophils Relative % 51 %   Lymphocytes Relative 42 %   Monocytes Relative 7 %   Eosinophils Relative 0 %   Basophils Relative 0 %   Neutro Abs 9.1 (H) 1.7 - 7.7 K/uL   Lymphs Abs 7.6 (H) 0.7 - 4.0 K/uL   Monocytes Absolute 1.3 (H) 0.1 - 1.0 K/uL   Eosinophils Absolute 0.0 0.0 - 0.7 K/uL   Basophils Absolute 0.0 0.0 - 0.1 K/uL   WBC Morphology ATYPICAL LYMPHOCYTES   Basic metabolic panel     Status: Abnormal   Collection Time: 04/20/18  1:24 AM  Result Value Ref Range   Sodium 138 135 - 145 mmol/L   Potassium 3.4 (L) 3.5 - 5.1 mmol/L   Chloride 106 101 - 111 mmol/L   CO2 24 22 - 32 mmol/L   Glucose, Bld 100 (H) 65 - 99 mg/dL   BUN 12 6 - 20 mg/dL   Creatinine, Ser 8.65 0.44 - 1.00 mg/dL   Calcium 8.9 8.9 - 78.4 mg/dL   GFR calc non Af Amer >60 >60 mL/min   GFR calc Af Amer >60 >60 mL/min   Anion gap 8 5 - 15   Imaging Studies: No results found.  ED COURSE and MDM  Nursing notes and initial vitals signs, including pulse oximetry, reviewed.  Vitals:   04/19/18 2343 04/19/18 2344  BP: 100/74   Pulse: 64   Resp: 20   Temp: 98.1 F (36.7 C)   SpO2: 100%   Weight:  95.3 kg (210 lb)  Height:   (1.575 m)   2:27 AM The patient has not been able to make an appointment for outpatient follow-up.  Because she is moving to [facility] in the next few days and will not have time to follow-up locally she was advised of the importance of finding a primary care physician when she gets to [facility].  We will place her on a steroid taper to provide continued treatment.  We will  also provide local wound care.  There is no evidence of renal involvement based on lab work.  PROCEDURES    ED DIAGNOSES     ICD-10-CM   1. 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

## 2018-04-22 LAB — PATHOLOGIST SMEAR REVIEW

## 2018-07-03 ENCOUNTER — Ambulatory Visit: Payer: Self-pay | Admitting: Allergy and Immunology

## 2018-10-01 LAB — OB RESULTS CONSOLE RUBELLA ANTIBODY, IGM: Rubella: IMMUNE

## 2018-10-25 ENCOUNTER — Emergency Department (HOSPITAL_BASED_OUTPATIENT_CLINIC_OR_DEPARTMENT_OTHER): Payer: Medicaid Other

## 2018-10-25 ENCOUNTER — Emergency Department (HOSPITAL_BASED_OUTPATIENT_CLINIC_OR_DEPARTMENT_OTHER)
Admission: EM | Admit: 2018-10-25 | Discharge: 2018-10-25 | Disposition: A | Discharge status: Home / Self Care | Payer: Medicaid Other | Attending: Emergency Medicine | Admitting: Emergency Medicine

## 2018-10-25 ENCOUNTER — Encounter (HOSPITAL_BASED_OUTPATIENT_CLINIC_OR_DEPARTMENT_OTHER): Payer: Self-pay

## 2018-10-25 ENCOUNTER — Other Ambulatory Visit: Payer: Self-pay

## 2018-10-25 DIAGNOSIS — O219 Vomiting of pregnancy, unspecified: Secondary | ICD-10-CM | POA: Diagnosis not present

## 2018-10-25 DIAGNOSIS — R109 Unspecified abdominal pain: Secondary | ICD-10-CM

## 2018-10-25 DIAGNOSIS — I10 Essential (primary) hypertension: Secondary | ICD-10-CM | POA: Insufficient documentation

## 2018-10-25 DIAGNOSIS — Z3A12 12 weeks gestation of pregnancy: Secondary | ICD-10-CM | POA: Diagnosis not present

## 2018-10-25 DIAGNOSIS — R1012 Left upper quadrant pain: Secondary | ICD-10-CM | POA: Insufficient documentation

## 2018-10-25 DIAGNOSIS — O26891 Other specified pregnancy related conditions, first trimester: Secondary | ICD-10-CM

## 2018-10-25 LAB — CBC WITH DIFFERENTIAL/PLATELET
Abs Immature Granulocytes: 0.01 10*3/uL (ref 0.00–0.07)
BASOS ABS: 0 10*3/uL (ref 0.0–0.1)
Basophils Relative: 0 %
EOS ABS: 0.1 10*3/uL (ref 0.0–0.5)
EOS PCT: 1 %
HCT: 34.4 % — ABNORMAL LOW (ref 36.0–46.0)
Hemoglobin: 11.4 g/dL — ABNORMAL LOW (ref 12.0–15.0)
Immature Granulocytes: 0 %
Lymphocytes Relative: 35 %
Lymphs Abs: 3.4 10*3/uL (ref 0.7–4.0)
MCH: 30.6 pg (ref 26.0–34.0)
MCHC: 33.1 g/dL (ref 30.0–36.0)
MCV: 92.5 fL (ref 80.0–100.0)
Monocytes Absolute: 0.5 10*3/uL (ref 0.1–1.0)
Monocytes Relative: 5 %
NRBC: 0 % (ref 0.0–0.2)
Neutro Abs: 5.5 10*3/uL (ref 1.7–7.7)
Neutrophils Relative %: 59 %
PLATELETS: 263 10*3/uL (ref 150–400)
RBC: 3.72 MIL/uL — AB (ref 3.87–5.11)
RDW: 12.2 % (ref 11.5–15.5)
WBC: 9.5 10*3/uL (ref 4.0–10.5)

## 2018-10-25 LAB — URINALYSIS, MICROSCOPIC (REFLEX): WBC, UA: NONE SEEN WBC/hpf (ref 0–5)

## 2018-10-25 LAB — COMPREHENSIVE METABOLIC PANEL
ALBUMIN: 3.4 g/dL — AB (ref 3.5–5.0)
ALT: 66 U/L — ABNORMAL HIGH (ref 0–44)
ANION GAP: 7 (ref 5–15)
AST: 51 U/L — ABNORMAL HIGH (ref 15–41)
Alkaline Phosphatase: 56 U/L (ref 38–126)
BUN: 6 mg/dL (ref 6–20)
CO2: 23 mmol/L (ref 22–32)
Calcium: 9.2 mg/dL (ref 8.9–10.3)
Chloride: 103 mmol/L (ref 98–111)
Creatinine, Ser: 0.68 mg/dL (ref 0.44–1.00)
GFR calc Af Amer: >60 mL/min (ref >60)
GFR calc non Af Amer: >60 mL/min (ref >60)
GLUCOSE: 103 mg/dL — AB (ref 70–99)
POTASSIUM: 3.8 mmol/L (ref 3.5–5.1)
SODIUM: 133 mmol/L — AB (ref 135–145)
TOTAL PROTEIN: 7.1 g/dL (ref 6.5–8.1)
Total Bilirubin: 0.2 mg/dL — ABNORMAL LOW (ref 0.3–1.2)

## 2018-10-25 LAB — URINALYSIS, ROUTINE W REFLEX MICROSCOPIC
Bilirubin Urine: NEGATIVE
GLUCOSE, UA: NEGATIVE mg/dL
Ketones, ur: NEGATIVE mg/dL
Leukocytes, UA: NEGATIVE
Nitrite: NEGATIVE
Protein, ur: NEGATIVE mg/dL
SPECIFIC GRAVITY, URINE: 1.015 (ref 1.005–1.030)
pH: 6.5 (ref 5.0–8.0)

## 2018-10-25 LAB — HCG, QUANTITATIVE, PREGNANCY: hCG, Beta Chain, Quant, S: 82775 m[IU]/mL — ABNORMAL HIGH (ref <5)

## 2018-10-25 MED ORDER — METOCLOPRAMIDE HCL 10 MG PO TABS: 10.0000 mg | ORAL_TABLET | Freq: Four times a day (QID) | ORAL | 0 refills | Status: DC | PRN

## 2018-10-25 MED ORDER — METOCLOPRAMIDE HCL 5 MG/ML IJ SOLN
10.0000 mg | Freq: Once | INTRAMUSCULAR | Status: AC
  Administered 2018-10-25: 10 mg via INTRAVENOUS
  Filled 2018-10-25: qty 2

## 2018-10-25 NOTE — ED Provider Notes (Signed)
[facility] Provider Note: Lowella DellJ. Lane Mace Weinberg, MD, FACEP  CSN: [id number] MRN: [id number] ARRIVAL: 10/25/18 at 1934 ROOM: MH04/MH04   CHIEF COMPLAINT  Abdominal Pain (11-[redacted] weeks pregnant)   HISTORY OF PRESENT ILLNESS  10/25/18 10:51 PM Nichole SlackDiamond Macken is a 22 y.o. female who is [redacted] weeks pregnant.  She has had a problem with nausea and vomiting, particularly at night.  She has been treated with Phenergan without relief.  She is here with a 3-day history of abdominal pain.  The abdominal pain is located in the left upper quadrant radiating to the lower midline.  At rest it is mild and dull but when she vomits it becomes a sudden, sharp pain that she rates as a 7 out of 10.  She denies associated fever, chills, dysuria, hematuria, vaginal bleeding or diarrhea.    Past Medical History:  Diagnosis Date  . Constipation   . 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 Hypertension   . Migraine   . Thyroid disease     Past Surgical History:  Procedure Laterality Date  . ESOPHAGOGASTRODUODENOSCOPY      Family History  Problem Relation Age of Onset  . Migraines Father   . Hypertension Father     Social History   Tobacco Use  . Smoking status: Never Smoker  . Smokeless tobacco: Never Used  Substance Use Topics  . Alcohol use: No    Alcohol/week: 0.0 standard drinks  . Drug use: No    Prior to Admission medications   Medication Sig Start Date End Date Taking? Authorizing Provider  promethazine (PHENERGAN) 25 MG tablet TK 1 T PO Q 6 H 02/25/16  Yes [provider]  SUMAtriptan (IMITREX) 25 MG tablet  02/25/16  Yes [provider]  methylPREDNISolone (MEDROL DOSEPAK) 4 MG TBPK tablet Take tapering dose per package instructions 04/20/18   Selwyn Reason, MD    Allergies Peanut-containing drug products and Penicillins   REVIEW OF SYSTEMS  Negative except as noted here or in the History of Present Illness.   PHYSICAL EXAMINATION  Initial Vital  Signs Blood pressure 106/67, pulse 72, temperature 98.5 F (36.9 C), temperature source Oral, resp. rate 18, height 5\' 2"  (1.575 m), weight 90.7 kg, last menstrual period 04/05/2018, SpO2 100 %.  Examination General: Well-developed, well-nourished female in no acute distress; appearance consistent with age of record HENT: normocephalic; atraumatic Eyes: pupils equal, round and reactive to light; extraocular muscles intact Neck: supple Heart: regular rate and rhythm Lungs: clear to auscultation bilaterally Abdomen: soft; nondistended; nontender; no masses or hepatosplenomegaly; bowel sounds present Extremities: No deformity; full range of motion; pulses normal Neurologic: Awake, alert and oriented; motor function intact in all extremities and symmetric; no facial droop Skin: Warm and dry Psychiatric: Normal mood and affect   RESULTS  Summary of this visit's results, reviewed by myself:   EKG Interpretation  Date/Time:    Ventricular Rate:    PR Interval:    QRS Duration:   QT Interval:    QTC Calculation:   R Axis:     Text Interpretation:        Laboratory Studies: Results for orders placed or performed during the hospital encounter of 10/25/18 (from the past 24 hour(s))  Urinalysis, Routine w reflex microscopic     Status: Abnormal   Collection Time: 10/25/18  7:49 PM  Result Value Ref Range   Color, Urine YELLOW YELLOW   APPearance CLEAR CLEAR   Specific Gravity, Urine 1.015 1.005 -  1.030   pH 6.5 5.0 - 8.0   Glucose, UA NEGATIVE NEGATIVE mg/dL   Hgb urine dipstick MODERATE (A) NEGATIVE   Bilirubin Urine NEGATIVE NEGATIVE   Ketones, ur NEGATIVE NEGATIVE mg/dL   Protein, ur NEGATIVE NEGATIVE mg/dL   Nitrite NEGATIVE NEGATIVE   Leukocytes, UA NEGATIVE NEGATIVE  Urinalysis, Microscopic (reflex)     Status: Abnormal   Collection Time: 10/25/18  7:49 PM  Result Value Ref Range   RBC / HPF 6-10 0 - 5 RBC/hpf   WBC, UA NONE SEEN 0 - 5 WBC/hpf   Bacteria, UA RARE (A)  NONE SEEN   Squamous Epithelial / LPF 0-5 0 - 5  CBC with Differential     Status: Abnormal   Collection Time: 10/25/18  9:32 PM  Result Value Ref Range   WBC 9.5 4.0 - 10.5 K/uL   RBC 3.72 (L) 3.87 - 5.11 MIL/uL   Hemoglobin 11.4 (L) 12.0 - 15.0 g/dL   HCT 16.1 (L) 09.6 - 04.5 %   MCV 92.5 80.0 - 100.0 fL   MCH 30.6 26.0 - 34.0 pg   MCHC 33.1 30.0 - 36.0 g/dL   RDW 40.9 81.1 - 91.4 %   Platelets 263 150 - 400 K/uL   nRBC 0.0 0.0 - 0.2 %   Neutrophils Relative % 59 %   Neutro Abs 5.5 1.7 - 7.7 K/uL   Lymphocytes Relative 35 %   Lymphs Abs 3.4 0.7 - 4.0 K/uL   Monocytes Relative 5 %   Monocytes Absolute 0.5 0.1 - 1.0 K/uL   Eosinophils Relative 1 %   Eosinophils Absolute 0.1 0.0 - 0.5 K/uL   Basophils Relative 0 %   Basophils Absolute 0.0 0.0 - 0.1 K/uL   Immature Granulocytes 0 %   Abs Immature Granulocytes 0.01 0.00 - 0.07 K/uL  Comprehensive metabolic panel     Status: Abnormal   Collection Time: 10/25/18  9:32 PM  Result Value Ref Range   Sodium 133 (L) 135 - 145 mmol/L   Potassium 3.8 3.5 - 5.1 mmol/L   Chloride 103 98 - 111 mmol/L   CO2 23 22 - 32 mmol/L   Glucose, Bld 103 (H) 70 - 99 mg/dL   BUN 6 6 - 20 mg/dL   Creatinine, Ser 7.82 0.44 - 1.00 mg/dL   Calcium 9.2 8.9 - 95.6 mg/dL   Total Protein 7.1 6.5 - 8.1 g/dL   Albumin 3.4 (L) 3.5 - 5.0 g/dL   AST 51 (H) 15 - 41 U/L   ALT 66 (H) 0 - 44 U/L   Alkaline Phosphatase 56 38 - 126 U/L   Total Bilirubin 0.2 (L) 0.3 - 1.2 mg/dL   GFR calc non Af Amer >60 >60 mL/min   GFR calc Af Amer >60 >60 mL/min   Anion gap 7 5 - 15  hCG, quantitative, pregnancy     Status: Abnormal   Collection Time: 10/25/18  9:32 PM  Result Value Ref Range   hCG, Beta Chain, Quant, S 82,775 (H) <5 mIU/mL   Imaging Studies: US Ob Comp < 14 Wks  Result Date: 10/25/2018 CLINICAL DATA:  Initial evaluation for acute mid pelvic pain, cramping. Early pregnancy. EXAM: OBSTETRIC <14 WK [place] AND TRANSVAGINAL OB US TECHNIQUE: Both transabdominal  and transvaginal ultrasound examinations were performed for complete evaluation of the gestation as well as the maternal uterus, adnexal regions, and pelvic cul-de-sac. Transvaginal technique was performed to assess early pregnancy. COMPARISON:  None. FINDINGS: Intrauterine gestational sac: Single  Yolk sac:  Not visualized. Embryo:  Present Cardiac Activity: Present Heart Rate: 162 bpm CRL:  55 mm   12 w   1 d                  [place] EDC: 05/08/2019 Subchorionic hemorrhage:  None visualized. Maternal uterus/adnexae: Ovaries not visualized bilaterally. No adnexal mass. No free fluid. IMPRESSION: 1. Single viable intrauterine pregnancy as above without complication, estimated gestational age [redacted] weeks and 1 day by crown-rump length, with ultrasound EDC of 05/08/2019. 2. No other acute maternal uterine or adnexal abnormality identified. Electronically Signed   By: Rise Mu M.D.   On: 10/25/2018 22:19    ED COURSE and MDM  Nursing notes and initial vitals signs, including pulse oximetry, reviewed.  Vitals:   10/25/18 1944 10/25/18 1947 10/25/18 2249  BP: 122/76  106/67  Pulse: 92  72  Resp: 18  18  Temp: 98.2 F (36.8 C)  98.5 F (36.9 C)  TempSrc: Oral  Oral  SpO2: 97%  100%  Weight:  90.7 kg   Height:  5\' 2"  (1.575 m)    11:46 PM She has nausea improved, able to eat crackers without emesis after IV Reglan.  She was advised of reassuring lab work and ultrasound.  We will have her follow-up with her OB/GYN, Dr. Shawnie Pons.  PROCEDURES    ED DIAGNOSES     ICD-10-CM   1. Nausea and vomiting in pregnancy prior to [redacted] weeks gestation O21.9   2. Abdominal pain in pregnancy, first trimester O26.891    R10.9        Keleigh Kazee, MD 10/25/18 [phone]

## 2018-10-25 NOTE — ED Notes (Signed)
Patient transported to Ultrasound 

## 2018-10-25 NOTE — ED Triage Notes (Signed)
C/o lower abd pain- pt states she is 11-[redacted] weeks pregnant-denies vaginal bleeding, discharge-NAD-steady gait

## 2018-10-25 NOTE — ED Notes (Signed)
ED Provider at bedside. 

## 2018-12-27 ENCOUNTER — Ambulatory Visit (INDEPENDENT_AMBULATORY_CARE_PROVIDER_SITE_OTHER): Payer: Medicaid Other | Admitting: Family Medicine

## 2018-12-27 ENCOUNTER — Encounter: Payer: Self-pay | Admitting: Family Medicine

## 2018-12-27 VITALS — BP 123/79 | HR 89 | Wt 205.0 lb

## 2018-12-27 DIAGNOSIS — O0990 Supervision of high risk pregnancy, unspecified, unspecified trimester: Secondary | ICD-10-CM

## 2018-12-27 DIAGNOSIS — E039 Hypothyroidism, unspecified: Secondary | ICD-10-CM

## 2018-12-27 DIAGNOSIS — D690 Allergic purpura: Secondary | ICD-10-CM

## 2018-12-27 DIAGNOSIS — K2100 Gastro-esophageal reflux disease with esophagitis, without bleeding: Secondary | ICD-10-CM

## 2018-12-27 DIAGNOSIS — E559 Vitamin D deficiency, unspecified: Secondary | ICD-10-CM | POA: Insufficient documentation

## 2018-12-27 DIAGNOSIS — O0991 Supervision of high risk pregnancy, unspecified, first trimester: Secondary | ICD-10-CM

## 2018-12-27 DIAGNOSIS — K5900 Constipation, unspecified: Secondary | ICD-10-CM

## 2018-12-27 DIAGNOSIS — R945 Abnormal results of liver function studies: Secondary | ICD-10-CM | POA: Insufficient documentation

## 2018-12-27 DIAGNOSIS — E038 Other specified hypothyroidism: Secondary | ICD-10-CM

## 2018-12-27 DIAGNOSIS — R7989 Other specified abnormal findings of blood chemistry: Secondary | ICD-10-CM

## 2018-12-27 LAB — POCT URINALYSIS DIPSTICK OB
Glucose, UA: NEGATIVE
LEUKOCYTES UA: NEGATIVE
PH UA: 7.5 (ref 5.0–8.0)
Spec Grav, UA: 1.015 (ref 1.010–1.025)

## 2018-12-27 MED ORDER — HYDROXYZINE PAMOATE 25 MG PO CAPS: 25.0000 mg | ORAL_CAPSULE | Freq: Three times a day (TID) | ORAL | 0 refills | Status: DC | PRN

## 2018-12-27 NOTE — Progress Notes (Signed)
Subjective:  Nichole Bush is a G1P0 [redacted]w[redacted]d being seen today for her first obstetrical visit. She had been getting care at Dr Tawni Levy office. Her obstetrical history is significant for obesity, HSP, asthma, subclinical hypothyroidism (was on medication at one point, but was getting conflicting recommendations and stopped the medication), elevated LFTs, and chronic constipation. She also reports episodes of sharp chest pains that she relates to anxiety. FOB involved. Patient does intend to breast feed. Pregnancy history fully reviewed.  Patient reports no complaints.  BP 123/79   Pulse 89   Wt 205 lb (93 kg)   LMP 04/05/2018   BMI 37.49 kg/m   HISTORY: OB History  Gravida Para Term Preterm AB Living  1            SAB TAB Ectopic Multiple Live Births               # Outcome Date GA Lbr Len/2nd Weight Sex Delivery Anes PTL Lv  1 Current             Past Medical History:  Diagnosis Date  . Constipation   . 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 Hypertension   . Migraine   . Thyroid disease     Past Surgical History:  Procedure Laterality Date  . ESOPHAGOGASTRODUODENOSCOPY      Family History  Problem Relation Age of Onset  . Migraines Father   . Hypertension Father      Exam  BP 123/79   Pulse 89   Wt 205 lb (93 kg)   LMP 04/05/2018   BMI 37.49 kg/m   CONSTITUTIONAL: Well-developed, well-nourished female in no acute distress.  HENT:  Normocephalic, atraumatic, External right and left ear normal. Oropharynx is clear and moist EYES: Conjunctivae and EOM are normal. Pupils are equal, round, and reactive to light. No scleral icterus.   CARDIOVASCULAR: Normal heart rate noted, regular rhythm RESPIRATORY: Clear to auscultation bilaterally. Effort and breath sounds normal, no problems with respiration noted. ABDOMEN: Soft, normal bowel sounds, no distention noted.  No tenderness, rebound or guarding.  MUSCULOSKELETAL: Normal range of motion. No tenderness.  No  cyanosis, clubbing, or edema.  2+ distal pulses. SKIN: Skin is warm and dry. No rash noted. Not diaphoretic. No erythema. No pallor. NEUROLOGIC: Alert and oriented to person, place, and time. Normal reflexes, muscle tone coordination. No cranial nerve deficit noted. PSYCHIATRIC: Normal mood and affect. Normal behavior. Normal judgment and thought content.    Assessment:    Pregnancy: G1P0 Patient Active Problem List   Diagnosis Date Noted  . Supervision of high risk pregnancy, antepartum 12/27/2018  . Abnormal liver function 12/27/2018  . Vitamin D deficiency 12/27/2018  . Henoch-Schonlein purpura (HCC) 02/26/2018  . Migraine 01/24/2017  . Gastroesophageal reflux disease with esophagitis 09/18/2016  . Asthma 04/18/2016  . Subclinical hypothyroidism 06/25/2015  . Constipation 04/22/2015  . CN (constipation)       Plan:   1. Supervision of high risk pregnancy, antepartum FHT normal. Discussed nature of practice, delivery and [place] at [facility], use of midwives, fellows, exposure to residents.  Offered Down's syndrome screening - she reports this being done at Dr Tawni Levy office - [facility] AMB BABYSCRIPTS OPT IN - [place] MFM OB DETAIL +14 WK; Future - POC Urinalysis Dipstick OB - Comprehensive metabolic panel - TSH - T3, free - T4, free - AMB referral to maternal fetal medicine - Ambulatory referral to [facility] - US Abdomen Limited RUQ; Future  2. Elevated  LFTs Check LFTs and RUQ US. Uncertain of etiology - NASH certainly possible, given BMI - US Abdomen Limited RUQ; Future  3. Subclinical hypothyroidism Will check TSH and T3/T4. May need supplementation  4. Constipation, unspecified constipation type Continue linzess, increase water intake to 3L, continue colace and miralax as needed.  5. Gastroesophageal reflux disease with esophagitis  6. Henoch-Schonlein purpura (HCC) Will get MFM's input for additional testing needed during this pregnancy.      Problem list reviewed and updated. 75% of 45 min visit spent on counseling and coordination of care.     Levie HeritageJacob J Denarius Sesler 12/27/2018

## 2018-12-28 LAB — COMPREHENSIVE METABOLIC PANEL
ALBUMIN: 3.6 g/dL — AB (ref 3.9–5.0)
ALK PHOS: 102 IU/L (ref 39–117)
ALT: 84 IU/L — AB (ref 0–32)
AST: 81 IU/L — AB (ref 0–40)
Albumin/Globulin Ratio: 1.3 (ref 1.2–2.2)
BUN / CREAT RATIO: 10 (ref 9–23)
BUN: 5 mg/dL — ABNORMAL LOW (ref 6–20)
CO2: 20 mmol/L (ref 20–29)
CREATININE: 0.51 mg/dL — AB (ref 0.57–1.00)
Calcium: 9.5 mg/dL (ref 8.7–10.2)
Chloride: 100 mmol/L (ref 96–106)
GFR calc Af Amer: 159 mL/min/{1.73_m2} (ref >59)
GFR, EST NON AFRICAN AMERICAN: 138 mL/min/{1.73_m2} (ref >59)
GLUCOSE: 78 mg/dL (ref 65–99)
Globulin, Total: 2.7 g/dL (ref 1.5–4.5)
POTASSIUM: 4.2 mmol/L (ref 3.5–5.2)
SODIUM: 140 mmol/L (ref 134–144)
Total Protein: 6.3 g/dL (ref 6.0–8.5)

## 2018-12-28 LAB — TSH: TSH: 8.03 u[IU]/mL — ABNORMAL HIGH (ref 0.450–4.500)

## 2018-12-28 LAB — T4, FREE: Free T4: 1.08 ng/dL (ref 0.82–1.77)

## 2018-12-28 LAB — T3, FREE: T3, Free: 3 pg/mL (ref 2.0–4.4)

## 2018-12-30 ENCOUNTER — Ambulatory Visit (HOSPITAL_COMMUNITY)
Admission: RE | Admit: 2018-12-30 | Discharge: 2018-12-30 | Disposition: A | Discharge status: Home / Self Care | Payer: Medicaid Other | Source: Ambulatory Visit | Attending: Family Medicine | Admitting: Family Medicine

## 2018-12-30 ENCOUNTER — Telehealth: Payer: Self-pay | Admitting: Family Medicine

## 2018-12-30 ENCOUNTER — Encounter (HOSPITAL_COMMUNITY): Payer: Self-pay

## 2018-12-30 ENCOUNTER — Other Ambulatory Visit (HOSPITAL_COMMUNITY): Payer: Self-pay | Admitting: *Deleted

## 2018-12-30 DIAGNOSIS — O99282 Endocrine, nutritional and metabolic diseases complicating pregnancy, second trimester: Secondary | ICD-10-CM

## 2018-12-30 DIAGNOSIS — Z363 Encounter for antenatal screening for malformations: Secondary | ICD-10-CM

## 2018-12-30 DIAGNOSIS — Z3A21 21 weeks gestation of pregnancy: Secondary | ICD-10-CM | POA: Diagnosis not present

## 2018-12-30 DIAGNOSIS — O99212 Obesity complicating pregnancy, second trimester: Secondary | ICD-10-CM | POA: Diagnosis not present

## 2018-12-30 DIAGNOSIS — E039 Hypothyroidism, unspecified: Secondary | ICD-10-CM

## 2018-12-30 DIAGNOSIS — O0990 Supervision of high risk pregnancy, unspecified, unspecified trimester: Secondary | ICD-10-CM | POA: Diagnosis present

## 2018-12-30 NOTE — Telephone Encounter (Signed)
Called patient to get her scheduled for an appointment with Asher Muir. Patient stated she did not need counseling, and refused the appointment.

## 2018-12-31 MED ORDER — LEVOTHYROXINE SODIUM 150 MCG PO TABS: 150.0000 ug | ORAL_TABLET | Freq: Every day | ORAL | 3 refills | Status: DC

## 2018-12-31 MED ORDER — LEVOTHYROXINE SODIUM 100 MCG PO TABS: 100.0000 ug | ORAL_TABLET | Freq: Every day | ORAL | 3 refills | Status: DC

## 2018-12-31 NOTE — Addendum Note (Signed)
Addended by: Levie Heritage on: 12/31/2018 12:25 PM   Modules accepted: Orders

## 2018-12-31 NOTE — Progress Notes (Signed)
Subclinical hypothyroidism confirmed. Will start on levothyroxine and recheck TSH in 4 weeks.  Levie Heritage, DO 12/31/2018 12:19 PM

## 2018-12-31 NOTE — Addendum Note (Signed)
Addended by: Levie Heritage on: 12/31/2018 12:19 PM   Modules accepted: Orders

## 2019-01-02 ENCOUNTER — Ambulatory Visit (HOSPITAL_BASED_OUTPATIENT_CLINIC_OR_DEPARTMENT_OTHER): Payer: Medicaid Other

## 2019-01-03 ENCOUNTER — Telehealth: Payer: Self-pay

## 2019-01-03 NOTE — Telephone Encounter (Signed)
Per [facility]) patient refused to schedule appointment with Asher Muir per Dr. Adrian Blackwater referral. Armandina Stammer RN

## 2019-01-04 ENCOUNTER — Ambulatory Visit (HOSPITAL_BASED_OUTPATIENT_CLINIC_OR_DEPARTMENT_OTHER)
Admission: RE | Admit: 2019-01-04 | Discharge: 2019-01-04 | Disposition: A | Discharge status: Home / Self Care | Payer: Medicaid Other | Source: Ambulatory Visit | Attending: Family Medicine | Admitting: Family Medicine

## 2019-01-04 DIAGNOSIS — R945 Abnormal results of liver function studies: Secondary | ICD-10-CM | POA: Insufficient documentation

## 2019-01-04 DIAGNOSIS — O0990 Supervision of high risk pregnancy, unspecified, unspecified trimester: Secondary | ICD-10-CM

## 2019-01-04 DIAGNOSIS — R7989 Other specified abnormal findings of blood chemistry: Secondary | ICD-10-CM

## 2019-01-21 ENCOUNTER — Emergency Department (HOSPITAL_BASED_OUTPATIENT_CLINIC_OR_DEPARTMENT_OTHER)
Admission: EM | Admit: 2019-01-21 | Discharge: 2019-01-21 | Disposition: A | Discharge status: Home / Self Care | Payer: Medicaid Other | Attending: Emergency Medicine | Admitting: Emergency Medicine

## 2019-01-21 ENCOUNTER — Encounter (HOSPITAL_BASED_OUTPATIENT_CLINIC_OR_DEPARTMENT_OTHER): Payer: Self-pay

## 2019-01-21 ENCOUNTER — Telehealth: Payer: Self-pay

## 2019-01-21 ENCOUNTER — Other Ambulatory Visit: Payer: Self-pay

## 2019-01-21 DIAGNOSIS — N949 Unspecified condition associated with female genital organs and menstrual cycle: Secondary | ICD-10-CM

## 2019-01-21 DIAGNOSIS — E079 Disorder of thyroid, unspecified: Secondary | ICD-10-CM | POA: Insufficient documentation

## 2019-01-21 DIAGNOSIS — Z79899 Other long term (current) drug therapy: Secondary | ICD-10-CM | POA: Insufficient documentation

## 2019-01-21 DIAGNOSIS — I10 Essential (primary) hypertension: Secondary | ICD-10-CM | POA: Diagnosis not present

## 2019-01-21 DIAGNOSIS — Z91010 Allergy to peanuts: Secondary | ICD-10-CM | POA: Diagnosis not present

## 2019-01-21 DIAGNOSIS — Z3A26 26 weeks gestation of pregnancy: Secondary | ICD-10-CM | POA: Diagnosis not present

## 2019-01-21 DIAGNOSIS — R1030 Lower abdominal pain, unspecified: Secondary | ICD-10-CM

## 2019-01-21 DIAGNOSIS — R1032 Left lower quadrant pain: Secondary | ICD-10-CM | POA: Insufficient documentation

## 2019-01-21 DIAGNOSIS — O26892 Other specified pregnancy related conditions, second trimester: Secondary | ICD-10-CM | POA: Diagnosis not present

## 2019-01-21 DIAGNOSIS — J45909 Unspecified asthma, uncomplicated: Secondary | ICD-10-CM | POA: Insufficient documentation

## 2019-01-21 LAB — URINALYSIS, ROUTINE W REFLEX MICROSCOPIC
Bilirubin Urine: NEGATIVE
Glucose, UA: NEGATIVE mg/dL
Ketones, ur: 15 mg/dL — AB
NITRITE: NEGATIVE
PH: 7 (ref 5.0–8.0)
PROTEIN: NEGATIVE mg/dL
SPECIFIC GRAVITY, URINE: 1.02 (ref 1.005–1.030)

## 2019-01-21 LAB — CBC WITH DIFFERENTIAL/PLATELET
ABS IMMATURE GRANULOCYTES: 0.07 10*3/uL (ref 0.00–0.07)
BASOS ABS: 0 10*3/uL (ref 0.0–0.1)
BASOS PCT: 0 %
Eosinophils Absolute: 0.2 10*3/uL (ref 0.0–0.5)
Eosinophils Relative: 2 %
HCT: 34.7 % — ABNORMAL LOW (ref 36.0–46.0)
Hemoglobin: 11.6 g/dL — ABNORMAL LOW (ref 12.0–15.0)
IMMATURE GRANULOCYTES: 1 %
Lymphocytes Relative: 32 %
Lymphs Abs: 3 10*3/uL (ref 0.7–4.0)
MCH: 31.4 pg (ref 26.0–34.0)
MCHC: 33.4 g/dL (ref 30.0–36.0)
MCV: 94 fL (ref 80.0–100.0)
MONOS PCT: 7 %
Monocytes Absolute: 0.7 10*3/uL (ref 0.1–1.0)
NEUTROS ABS: 5.5 10*3/uL (ref 1.7–7.7)
NEUTROS PCT: 58 %
NRBC: 0 % (ref 0.0–0.2)
PLATELETS: 232 10*3/uL (ref 150–400)
RBC: 3.69 MIL/uL — AB (ref 3.87–5.11)
RDW: 12.6 % (ref 11.5–15.5)
WBC: 9.4 10*3/uL (ref 4.0–10.5)

## 2019-01-21 LAB — COMPREHENSIVE METABOLIC PANEL
ALT: 80 U/L — AB (ref 0–44)
AST: 65 U/L — AB (ref 15–41)
Albumin: 3 g/dL — ABNORMAL LOW (ref 3.5–5.0)
Alkaline Phosphatase: 115 U/L (ref 38–126)
Anion gap: 5 (ref 5–15)
BUN: 5 mg/dL — AB (ref 6–20)
CHLORIDE: 108 mmol/L (ref 98–111)
CO2: 20 mmol/L — AB (ref 22–32)
CREATININE: 0.5 mg/dL (ref 0.44–1.00)
Calcium: 8.9 mg/dL (ref 8.9–10.3)
Glucose, Bld: 94 mg/dL (ref 70–99)
Potassium: 3.7 mmol/L (ref 3.5–5.1)
SODIUM: 133 mmol/L — AB (ref 135–145)
Total Bilirubin: 0.4 mg/dL (ref 0.3–1.2)
Total Protein: 6.6 g/dL (ref 6.5–8.1)

## 2019-01-21 LAB — URINALYSIS, MICROSCOPIC (REFLEX)

## 2019-01-21 MED ORDER — LACTATED RINGERS IV BOLUS
1000.0000 mL | Freq: Once | INTRAVENOUS | Status: AC
  Administered 2019-01-21: 1000 mL via INTRAVENOUS

## 2019-01-21 NOTE — ED Triage Notes (Signed)
Pt c/o left side abd pain x 3 days-denies vaginal d/c and bleeding-was advised by OB at ~11am [facility]-pt states she does not have a ride-pt is G1-NAD-steady gait

## 2019-01-21 NOTE — Discharge Instructions (Signed)
You likely have round ligament pain from your growing baby.   Take tylenol as needed for pain   Stay hydrated   See your OB doctor this week   Return to ER if you have worse abdominal pain, contractions, vaginal bleeding, vaginal discharge

## 2019-01-21 NOTE — Progress Notes (Signed)
Contacted by [facility] about pt. G1P0 c/o left-sided abdominal pain x3 days. Faculty provider Stinson DO reviewed FHR tracing. Pt. Has no obstetrical complaints. Pt. Cleared obstetrically at this time. Pt. Category I FHR tracing and no ctx noted. Pt. Denies leaking of fluid or vaginal bleeding. RN at [facility] [facility] [place] notified at this time.

## 2019-01-21 NOTE — ED Provider Notes (Signed)
[facility] EMERGENCY DEPARTMENT Provider Note   CSN: [id number] Arrival date & time: 01/21/19  2052    History   Chief Complaint Chief Complaint  Patient presents with  . Abdominal Pain    26 weeks preg per pt    HPI Nichole Bush is a 23 y.o. female G1P0 here presenting with lower abdominal pain.  Patient states that she has left lower quadrant pain for the last 3 days.  She states that she thought the baby may have moved recently and she feels a lot of pelvic pressure.  Denies any loss of fluid or vaginal discharge or bleeding.  Patient denies any blood in her urine or dysuria.  Denies any flank pain or vomiting or fevers.  [facility] did not have a ride so came here instead.  [facility]      The history is provided by the patient.    Past Medical History:  Diagnosis Date  . Constipation   . 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 Hypertension   . Migraine   . Thyroid disease     Patient Active Problem List   Diagnosis Date Noted  . Supervision of high risk pregnancy, antepartum 12/27/2018  . Abnormal liver function 12/27/2018  . Vitamin D deficiency 12/27/2018  . Henoch-Schonlein purpura (HCC) 02/26/2018  . Migraine 01/24/2017  . Gastroesophageal reflux disease with esophagitis 09/18/2016  . Asthma 04/18/2016  . Subclinical hypothyroidism 06/25/2015  . Constipation 04/22/2015  . CN (constipation)     Past Surgical History:  Procedure Laterality Date  . ESOPHAGOGASTRODUODENOSCOPY       OB History    Gravida  1   Para      Term      Preterm      AB      Living        SAB      TAB      Ectopic      Multiple      Live Births               Home Medications    Prior to Admission medications   Medication Sig Start Date End Date Taking? Authorizing Provider  hydrOXYzine (VISTARIL) 25 MG capsule Take 1 capsule (25 mg total) by mouth 3 (three)  times daily as needed for anxiety. 12/27/18   Levie HeritageStinson, Jacob J, DO  levothyroxine (SYNTHROID, LEVOTHROID) 150 MCG tablet Take 1 tablet (150 mcg total) by mouth daily before breakfast. 12/31/18   Levie HeritageStinson, Jacob J, DO  linaclotide (LINZESS) 290 MCG CAPS capsule Take 290 mcg by mouth daily before breakfast.    [provider]  Prenatal MV-Min-Fe Fum-FA-DHA (PRENATAL 1 PO) Take by mouth.    [provider]  SUMAtriptan (IMITREX) 25 MG tablet  02/25/16   [provider]    Family History Family History  Problem Relation Age of Onset  . Migraines Father   . Hypertension Father     Social History Social History   Tobacco Use  . Smoking status: Never Smoker  . Smokeless tobacco: Never Used  Substance Use Topics  . Alcohol use: No    Alcohol/week: 0.0 standard drinks  . Drug use: No     Allergies   Peanut-containing drug products; Penicillins; and Peanut oil   Review of Systems Review of Systems  Gastrointestinal: Positive for abdominal pain.  All other systems reviewed and are negative.  Physical Exam Updated Vital Signs BP 121/87 (BP Location: Left Arm)   Pulse (!) 110   Temp 98.1 F (36.7 C) (Oral)   Resp 20   Ht 5\' 2"  (1.575 m)   Wt 96.2 kg   LMP 04/05/2018   SpO2 96%   BMI 38.78 kg/m   Physical Exam Vitals signs and nursing note reviewed.  Constitutional:      Appearance: She is well-developed.  HENT:     Head: Normocephalic.     Mouth/Throat:     Mouth: Mucous membranes are moist.  Eyes:     Extraocular Movements: Extraocular movements intact.  Cardiovascular:     Rate and Rhythm: Normal rate and regular rhythm.  Pulmonary:     Effort: Pulmonary effort is normal.     Breath sounds: Normal breath sounds.  Abdominal:     General: Abdomen is flat.     Palpations: Abdomen is soft.     Comments: Gravid uterus, minimal LLQ tenderness. No CVAT   Skin:    General: Skin is warm.     Capillary Refill: Capillary refill takes less  than 2 seconds.  Neurological:     General: No focal deficit present.     Mental Status: She is alert.  Psychiatric:        Mood and Affect: Mood normal.        Behavior: Behavior normal.      ED Treatments / Results  Labs (all labs ordered are listed, but only abnormal results are displayed) Labs Reviewed  CBC WITH DIFFERENTIAL/PLATELET - Abnormal; Notable for the following components:      Result Value   RBC 3.69 (*)    Hemoglobin 11.6 (*)    HCT 34.7 (*)    All other components within normal limits  URINE CULTURE  COMPREHENSIVE METABOLIC PANEL  URINALYSIS, ROUTINE W REFLEX MICROSCOPIC    EKG None  Radiology No results found.  Procedures Procedures (including critical care time)  EMERGENCY DEPARTMENT [place] PREGNANCY "Study: Limited Ultrasound of the Pelvis for Pregnancy"  INDICATIONS:Pregnancy(required) Multiple views of the uterus and pelvic cavity were obtained in real-time with a multi-frequency probe.  APPROACH:Transabdominal  PERFORMED BY: Myself IMAGES ARCHIVED?: Yes LIMITATIONS: Body habitus PREGNANCY FREE FLUID: None ADNEXAL FINDINGS:Left ovary not seen and Right ovary not seen GESTATIONAL AGE, ESTIMATE: 25 weeks  FETAL HEART RATE: 188 INTERPRETATION: Intrauterine gestational sac noted, Yolk sac noted, Fetal pole present and Fetal heart activity seen   EMERGENCY DEPARTMENT US RENAL EXAM  "Study: Limited Retroperitoneal Ultrasound of Kidneys"  INDICATIONS: Back pain Long and short axis of both kidneys were obtained.   PERFORMED BY: Myself IMAGES ARCHIVED?: Yes LIMITATIONS: Body habitus VIEWS USED: Long axis  INTERPRETATION: No Hydronephrosis      Medications Ordered in ED Medications  lactated ringers bolus 1,000 mL (1,000 mLs Intravenous New Bag/Given 01/21/19 2140)     Initial Impression / Assessment and Plan / ED Course  I have reviewed the triage vital signs and the nursing notes.  Pertinent labs & imaging results that were  available during my care of the patient were reviewed by me and considered in my medical decision making (see chart for details).       Nichole Bush is a 23 y.o. female here with LLQ pain. She is [redacted] weeks pregnant by [place] today. She has no RLQ tenderness. Bedside US showed no hydro and no obvious ovarian cyst or free fluid. She has no GI symptoms. Will check labs, UA. I suspect round  ligament pain. Will put on toco and baby monitor.  10:28 PM [facility] called and patient has nl FHR and no contractions so cleared by OB. Labs  Showed slightly elevated AST/ALT which is stable from previous and she has no RUQ tenderness. UA contaminated so urine culture sent. I think likely round ligament pain. Will dc home with OB follow up. Recommend tylenol prn    Final Clinical Impressions(s) / ED Diagnoses   Final diagnoses:  None    ED Discharge Orders    None       Charlynne Pander, MD 01/21/19 2230

## 2019-01-21 NOTE — Telephone Encounter (Signed)
Nichole Bush called stating she is having pains on her left side since Sunday. Nichole Bush states pain has gotten progressively worse in the last few hours. Nichole Bush is 30+ weeks and has had one visit at our office. She has had elevated LFTs and Dr. Adrian Blackwater recommended she follow up with  Hepatologist. Nichole Bush denies and bleeding or leaking of fluid. Nichole Bush advised to go now to [facility] to [facility], as we dont have a provider in office this afternoon. Nichole Bush asks if there is somewhere in [facility] she can go- advised the best place for her to be evaluated is with our docotors at [facility] at [address] Nichole Bush states understanding. Armandina Stammer RN

## 2019-01-23 LAB — URINE CULTURE

## 2019-01-27 ENCOUNTER — Encounter: Payer: Self-pay | Admitting: Obstetrics & Gynecology

## 2019-01-27 ENCOUNTER — Ambulatory Visit (INDEPENDENT_AMBULATORY_CARE_PROVIDER_SITE_OTHER): Payer: Medicaid Other | Admitting: Obstetrics & Gynecology

## 2019-01-27 VITALS — BP 126/77 | HR 98 | Wt 212.0 lb

## 2019-01-27 DIAGNOSIS — E559 Vitamin D deficiency, unspecified: Secondary | ICD-10-CM

## 2019-01-27 DIAGNOSIS — E038 Other specified hypothyroidism: Secondary | ICD-10-CM

## 2019-01-27 DIAGNOSIS — K2100 Gastro-esophageal reflux disease with esophagitis, without bleeding: Secondary | ICD-10-CM

## 2019-01-27 DIAGNOSIS — E039 Hypothyroidism, unspecified: Secondary | ICD-10-CM

## 2019-01-27 DIAGNOSIS — B354 Tinea corporis: Secondary | ICD-10-CM

## 2019-01-27 DIAGNOSIS — J4520 Mild intermittent asthma, uncomplicated: Secondary | ICD-10-CM

## 2019-01-27 DIAGNOSIS — Z3A25 25 weeks gestation of pregnancy: Secondary | ICD-10-CM

## 2019-01-27 DIAGNOSIS — O0990 Supervision of high risk pregnancy, unspecified, unspecified trimester: Secondary | ICD-10-CM

## 2019-01-27 DIAGNOSIS — O0992 Supervision of high risk pregnancy, unspecified, second trimester: Secondary | ICD-10-CM

## 2019-01-27 MED ORDER — BUTENAFINE HCL 1 % EX CREA: 1.0000 | TOPICAL_CREAM | Freq: Two times a day (BID) | CUTANEOUS | 1 refills | Status: DC

## 2019-01-27 NOTE — Progress Notes (Signed)
   PRENATAL VISIT NOTE  Subjective:  Nichole Bush is a 23 y.o. G1P0 at [redacted]w[redacted]d being seen today for ongoing prenatal care.  She is currently monitored for the following issues for this low-risk pregnancy and has Constipation; CN (constipation); Supervision of high risk pregnancy, antepartum; Henoch-Schonlein purpura (HCC); Abnormal liver function; Asthma; Gastroesophageal reflux disease with esophagitis; Migraine; Subclinical hypothyroidism; and Vitamin D deficiency on their problem list.  Patient reports rash between breasts. Pt reports swweatign a lot in this area. .  Contractions: Not present. Vag. Bleeding: None.  Movement: Present. Denies leaking of fluid.   The following portions of the patient's history were reviewed and updated as appropriate: allergies, current medications, past family history, past medical history, past social history, past surgical history and problem list. Problem list updated.  Objective:   Vitals:   01/27/19 0927  BP: 126/77  Pulse: 98  Weight: 212 lb (96.2 kg)    Fetal Status: Fetal Heart Rate (bpm): 145   Movement: Present     General:  Alert, oriented and cooperative. Patient is in no acute distress.  Skin: Skin is warm and dry. No rash noted.   Cardiovascular: Normal heart rate noted  Respiratory: Normal respiratory effort, no problems with respiration noted  Abdomen: Soft, gravid, appropriate for gestational age.  Pain/Pressure: Present     Pelvic: Cervical exam deferred        Extremities: Normal range of motion.  Edema: Trace  Mental Status: Normal mood and affect. Normal behavior. Normal judgment and thought content.   Assessment and Plan:  Pregnancy: G1P0 at [redacted]w[redacted]d  1. Supervision of high risk pregnancy, antepartum Pt to f/u prior to next visit for HIV, PRP, 2 hour GTT and TSH   2. Subclinical hypothyroidism Repeat TSH with 28 week labs  3. Vitamin D deficiency  4. Gastroesophageal reflux disease with esophagitis No active sx  5. Mild  intermittent asthma without complication  6. Tinea corporis  Lotrimin cream to affected area bid.   Preterm labor symptoms and general obstetric precautions including but not limited to vaginal bleeding, contractions, leaking of fluid and fetal movement were reviewed in detail with the patient. Please refer to After Visit Summary for other counseling recommendations.  Return in about 4 weeks (around 02/24/2019).  Future Appointments  Date Time Provider Department Center  01/28/2019  1:00 PM [facility] [place] 3 [facility]    Willodean Rosenthal, MD

## 2019-01-28 ENCOUNTER — Ambulatory Visit (HOSPITAL_COMMUNITY)
Admission: RE | Admit: 2019-01-28 | Discharge: 2019-01-28 | Disposition: A | Discharge status: Home / Self Care | Payer: Medicaid Other | Source: Ambulatory Visit | Attending: Obstetrics & Gynecology | Admitting: Obstetrics & Gynecology

## 2019-01-28 ENCOUNTER — Encounter (HOSPITAL_COMMUNITY): Payer: Self-pay

## 2019-01-28 ENCOUNTER — Other Ambulatory Visit (HOSPITAL_COMMUNITY): Payer: Self-pay | Admitting: *Deleted

## 2019-01-28 DIAGNOSIS — O99280 Endocrine, nutritional and metabolic diseases complicating pregnancy, unspecified trimester: Principal | ICD-10-CM

## 2019-01-28 DIAGNOSIS — O99212 Obesity complicating pregnancy, second trimester: Secondary | ICD-10-CM

## 2019-01-28 DIAGNOSIS — E039 Hypothyroidism, unspecified: Secondary | ICD-10-CM | POA: Diagnosis not present

## 2019-01-28 DIAGNOSIS — O0990 Supervision of high risk pregnancy, unspecified, unspecified trimester: Secondary | ICD-10-CM | POA: Diagnosis present

## 2019-01-28 DIAGNOSIS — Z3A25 25 weeks gestation of pregnancy: Secondary | ICD-10-CM | POA: Diagnosis not present

## 2019-02-05 ENCOUNTER — Other Ambulatory Visit: Payer: Medicaid Other

## 2019-02-05 DIAGNOSIS — O0990 Supervision of high risk pregnancy, unspecified, unspecified trimester: Secondary | ICD-10-CM

## 2019-02-06 ENCOUNTER — Encounter: Payer: Self-pay | Admitting: Obstetrics & Gynecology

## 2019-02-06 ENCOUNTER — Telehealth: Payer: Self-pay

## 2019-02-06 DIAGNOSIS — O24419 Gestational diabetes mellitus in pregnancy, unspecified control: Secondary | ICD-10-CM

## 2019-02-06 LAB — CBC
HEMOGLOBIN: 11.5 g/dL (ref 11.1–15.9)
Hematocrit: 33.1 % — ABNORMAL LOW (ref 34.0–46.6)
MCH: 32 pg (ref 26.6–33.0)
MCHC: 34.7 g/dL (ref 31.5–35.7)
MCV: 92 fL (ref 79–97)
Platelets: 215 10*3/uL (ref 150–450)
RBC: 3.59 x10E6/uL — AB (ref 3.77–5.28)
RDW: 13 % (ref 11.7–15.4)
WBC: 8.2 10*3/uL (ref 3.4–10.8)

## 2019-02-06 LAB — GLUCOSE TOLERANCE, 2 HOURS W/ 1HR
GLUCOSE, 2 HOUR: 144 mg/dL (ref 65–152)
Glucose, 1 hour: 192 mg/dL — ABNORMAL HIGH (ref 65–179)
Glucose, Fasting: 89 mg/dL (ref 65–91)

## 2019-02-06 LAB — RPR: RPR: NONREACTIVE

## 2019-02-06 LAB — HIV ANTIBODY (ROUTINE TESTING W REFLEX): HIV SCREEN 4TH GENERATION: NONREACTIVE

## 2019-02-06 NOTE — Telephone Encounter (Signed)
Called pt in regards to  2 hr GTT. Pt made aware that she failed her glucose test and has gestational diabetes. Pt also made aware that she is scheduled for a diabetes education class on 02/19/19 at 3:15 pm. Understanding was voiced. Edwardine Deschepper l Yair Dusza, CMA

## 2019-02-12 ENCOUNTER — Ambulatory Visit: Payer: Medicaid Other

## 2019-02-19 ENCOUNTER — Other Ambulatory Visit: Payer: Self-pay

## 2019-02-19 ENCOUNTER — Encounter: Payer: Medicaid Other | Attending: Family Medicine | Admitting: Registered"

## 2019-02-19 ENCOUNTER — Ambulatory Visit: Payer: Medicaid Other

## 2019-02-19 DIAGNOSIS — O99810 Abnormal glucose complicating pregnancy: Secondary | ICD-10-CM | POA: Diagnosis present

## 2019-02-20 DIAGNOSIS — O24410 Gestational diabetes mellitus in pregnancy, diet controlled: Secondary | ICD-10-CM

## 2019-02-20 MED ORDER — GLUCOSE BLOOD VI STRP: ORAL_STRIP | 12 refills | Status: DC

## 2019-02-20 MED ORDER — ACCU-CHEK FASTCLIX LANCETS MISC: 1.0000 | Freq: Four times a day (QID) | 12 refills | Status: DC

## 2019-02-21 ENCOUNTER — Encounter: Payer: Self-pay | Admitting: Registered"

## 2019-02-21 DIAGNOSIS — O99810 Abnormal glucose complicating pregnancy: Secondary | ICD-10-CM | POA: Insufficient documentation

## 2019-02-21 NOTE — Progress Notes (Signed)
Patient was seen on 02/19/19 for Gestational Diabetes self-management class at the [facility]. The following learning objectives were met by the patient during this course:   States the definition of Gestational Diabetes  States why dietary management is important in controlling blood glucose  Describes the effects each nutrient has on blood glucose levels  Demonstrates ability to create a balanced meal plan  Demonstrates carbohydrate counting   States when to check blood glucose levels  Demonstrates proper blood glucose monitoring techniques  States the effect of stress and exercise on blood glucose levels  States the importance of limiting caffeine and abstaining from alcohol and smoking  Blood glucose monitor given: Accu-chek Guide me Lot # [id number] Exp: 01/21/20 Blood glucose reading: 77 mg/dL  Patient instructed to monitor glucose levels: FBS: 60 - <95; 1 hour: <140; 2 hour: <120  Patient received handouts:  Nutrition Diabetes and Pregnancy, including carb counting list  Patient will be seen for follow-up as needed.

## 2019-02-25 ENCOUNTER — Encounter (HOSPITAL_COMMUNITY): Payer: Self-pay

## 2019-02-25 ENCOUNTER — Other Ambulatory Visit: Payer: Self-pay

## 2019-02-25 ENCOUNTER — Other Ambulatory Visit (HOSPITAL_COMMUNITY): Payer: Self-pay | Admitting: *Deleted

## 2019-02-25 ENCOUNTER — Encounter: Payer: Self-pay | Admitting: Advanced Practice Midwife

## 2019-02-25 ENCOUNTER — Ambulatory Visit (HOSPITAL_COMMUNITY): Payer: Medicaid Other | Admitting: *Deleted

## 2019-02-25 ENCOUNTER — Ambulatory Visit (INDEPENDENT_AMBULATORY_CARE_PROVIDER_SITE_OTHER): Payer: Medicaid Other | Admitting: Advanced Practice Midwife

## 2019-02-25 ENCOUNTER — Ambulatory Visit (HOSPITAL_COMMUNITY)
Admission: RE | Admit: 2019-02-25 | Discharge: 2019-02-25 | Disposition: A | Discharge status: Home / Self Care | Payer: Medicaid Other | Source: Ambulatory Visit | Attending: Obstetrics and Gynecology | Admitting: Obstetrics and Gynecology

## 2019-02-25 VITALS — BP 122/69 | HR 97 | Temp 98.5°F

## 2019-02-25 VITALS — BP 109/58 | HR 102 | Temp 98.1°F | Wt 219.1 lb

## 2019-02-25 DIAGNOSIS — O99280 Endocrine, nutritional and metabolic diseases complicating pregnancy, unspecified trimester: Secondary | ICD-10-CM

## 2019-02-25 DIAGNOSIS — Z362 Encounter for other antenatal screening follow-up: Secondary | ICD-10-CM

## 2019-02-25 DIAGNOSIS — E039 Hypothyroidism, unspecified: Secondary | ICD-10-CM | POA: Diagnosis present

## 2019-02-25 DIAGNOSIS — O99283 Endocrine, nutritional and metabolic diseases complicating pregnancy, third trimester: Secondary | ICD-10-CM | POA: Diagnosis not present

## 2019-02-25 DIAGNOSIS — O24419 Gestational diabetes mellitus in pregnancy, unspecified control: Secondary | ICD-10-CM

## 2019-02-25 DIAGNOSIS — O0990 Supervision of high risk pregnancy, unspecified, unspecified trimester: Secondary | ICD-10-CM

## 2019-02-25 DIAGNOSIS — Z3A29 29 weeks gestation of pregnancy: Secondary | ICD-10-CM

## 2019-02-25 DIAGNOSIS — R7989 Other specified abnormal findings of blood chemistry: Secondary | ICD-10-CM

## 2019-02-25 DIAGNOSIS — O99282 Endocrine, nutritional and metabolic diseases complicating pregnancy, second trimester: Secondary | ICD-10-CM

## 2019-02-25 DIAGNOSIS — O99213 Obesity complicating pregnancy, third trimester: Secondary | ICD-10-CM | POA: Diagnosis not present

## 2019-02-25 DIAGNOSIS — R945 Abnormal results of liver function studies: Secondary | ICD-10-CM

## 2019-02-25 LAB — POCT URINALYSIS DIPSTICK OB
Glucose, UA: NEGATIVE
Ketones, UA: NEGATIVE
Leukocytes, UA: NEGATIVE
Nitrite, UA: NEGATIVE
PH UA: 7 (ref 5.0–8.0)
Spec Grav, UA: 1.01 (ref 1.010–1.025)

## 2019-02-25 NOTE — Addendum Note (Signed)
Addended by: Anell Barr on: 02/25/2019 01:48 PM   Modules accepted: Orders

## 2019-02-25 NOTE — Progress Notes (Signed)
   PRENATAL VISIT NOTE  Subjective:  Nichole Bush is a 23 y.o. G1P0 at 49w3dbeing seen today for ongoing prenatal care.  She is currently monitored for the following issues for this high-risk pregnancy and has Constipation; CN (constipation); Supervision of high risk pregnancy, antepartum; Henoch-Schonlein purpura (HStaples; Abnormal liver function; Asthma; Gastroesophageal reflux disease with esophagitis; Migraine; Subclinical hypothyroidism; Vitamin D deficiency; Gestational diabetes mellitus (GDM) affecting pregnancy, antepartum; Abnormal glucose tolerance test (GTT) during pregnancy, antepartum; and Hypothyroidism affecting pregnancy on their problem list.  Patient reports no complaints.  Contractions: Irritability. Vag. Bleeding: None.  Movement: Present. Denies leaking of fluid.   The following portions of the patient's history were reviewed and updated as appropriate: allergies, current medications, past family history, past medical history, past social history, past surgical history and problem list.   Objective:   Vitals:   02/25/19 0853  BP: (!) 109/58  Pulse: (!) 102  Temp: 98.1 F (36.7 C)  Weight: 99.4 kg    Fetal Status: Fetal Heart Rate (bpm): 135 Fundal Height: 28 cm Movement: Present     General:  Alert, oriented and cooperative. Patient is in no acute distress.  Skin: Skin is warm and dry. No rash noted.   Cardiovascular: Normal heart rate noted  Respiratory: Normal respiratory effort, no problems with respiration noted  Abdomen: Soft, gravid, appropriate for gestational age.  Pain/Pressure: Absent     Pelvic: Cervical exam deferred        Extremities: Normal range of motion.  Edema: Trace  Mental Status: Normal mood and affect. Normal behavior. Normal judgment and thought content.   Assessment and Plan:  Pregnancy: G1P0 at 226w3d. Supervision of high risk pregnancy, antepartum       Wants to do waterbirth.       Discussed risk status is borderline.  Good diabetic  control, but also has hypothyroidism and abnormal LFTs  Will see how pregnancy progresses - POC Urinalysis Dipstick OB  2. Gestational diabetes mellitus (GDM) affecting pregnancy, antepartum     States FBS 60-90, 2 hr PCs mostly under 122.  Was higher when she was driinking a certain sugar free drink, then stopped it and sugars normal ever since)  3. Liver function test abnormality     Repeat LFTs today      Add Hep tests - Comp Met (CMET) - Hepatitis C Antibody - Hepatitis B surface antigen  4. Hypothyroidism, unspecified type     Not done at [facility], will test today.  Elevated last measurement    Has growth [place] - TSH   Preterm labor symptoms and general obstetric precautions including but not limited to vaginal bleeding, contractions, leaking of fluid and fetal movement were reviewed in detail with the patient. Please refer to After Visit Summary for other counseling recommendations.   [facility] (around [facility]18[facility]2019[facility] Future Appointments  Date Time Provider DeRefugio3/24/2020 12:50 PM WHDania BeachURSE [facility]CrestwoodFC-US  02/25/2019  1:00 PM WHEgegikSKorea WH-MFCUS MFC-US  03/18/2019  9:00 AM WiSeabron SpatesCNM CWH-WMHP None    MaHansel FeinsteinCNM

## 2019-02-25 NOTE — Patient Instructions (Signed)
Diabetes Mellitus and Nutrition, Adult  When you have diabetes (diabetes mellitus), it is very important to have healthy eating habits because your blood sugar (glucose) levels are greatly affected by what you eat and drink. Eating healthy foods in the appropriate amounts, at about the same times every day, can help you:  · Control your blood glucose.  · Lower your risk of heart disease.  · Improve your blood pressure.  · Reach or maintain a healthy weight.  Every person with diabetes is different, and each person has different needs for a meal plan. Your health care provider may recommend that you work with a diet and nutrition specialist (dietitian) to make a meal plan that is best for you. Your meal plan may vary depending on factors such as:  · The calories you need.  · The medicines you take.  · Your weight.  · Your blood glucose, blood pressure, and cholesterol levels.  · Your activity level.  · Other health conditions you have, such as heart or kidney disease.  How do carbohydrates affect me?  Carbohydrates, also called carbs, affect your blood glucose level more than any other type of food. Eating carbs naturally raises the amount of glucose in your blood. Carb counting is a method for keeping track of how many carbs you eat. Counting carbs is important to keep your blood glucose at a healthy level, especially if you use insulin or take certain oral diabetes medicines.  It is important to know how many carbs you can safely have in each meal. This is different for every person. Your dietitian can help you calculate how many carbs you should have at each meal and for each snack.  Foods that contain carbs include:  · Bread, cereal, rice, pasta, and crackers.  · Potatoes and corn.  · Peas, beans, and lentils.  · Milk and yogurt.  · Fruit and juice.  · Desserts, such as cakes, cookies, ice cream, and candy.  How does alcohol affect me?  Alcohol can cause a sudden decrease in blood glucose (hypoglycemia),  especially if you use insulin or take certain oral diabetes medicines. Hypoglycemia can be a life-threatening condition. Symptoms of hypoglycemia (sleepiness, dizziness, and confusion) are similar to symptoms of having too much alcohol.  If your health care provider says that alcohol is safe for you, follow these guidelines:  · Limit alcohol intake to no more than 1 drink per day for nonpregnant women and 2 drinks per day for men. One drink equals 12 oz of beer, 5 oz of wine, or 1½ oz of hard liquor.  · Do not drink on an empty stomach.  · Keep yourself hydrated with water, diet soda, or unsweetened iced tea.  · Keep in mind that regular soda, juice, and other mixers may contain a lot of sugar and must be counted as carbs.  What are tips for following this plan?    Reading food labels  · Start by checking the serving size on the "Nutrition Facts" label of packaged foods and drinks. The amount of calories, carbs, fats, and other nutrients listed on the label is based on one serving of the item. Many items contain more than one serving per package.  · Check the total grams (g) of carbs in one serving. You can calculate the number of servings of carbs in one serving by dividing the total carbs by 15. For example, if a food has 30 g of total carbs, it would be equal to 2   servings of carbs.  · Check the number of grams (g) of saturated and trans fats in one serving. Choose foods that have low or no amount of these fats.  · Check the number of milligrams (mg) of salt (sodium) in one serving. Most people should limit total sodium intake to less than 2,300 mg per day.  · Always check the nutrition information of foods labeled as "low-fat" or "nonfat". These foods may be higher in added sugar or refined carbs and should be avoided.  · Talk to your dietitian to identify your daily goals for nutrients listed on the label.  Shopping  · Avoid buying canned, premade, or processed foods. These foods tend to be high in fat, sodium,  and added sugar.  · Shop around the outside edge of the grocery store. This includes fresh fruits and vegetables, bulk grains, fresh meats, and fresh dairy.  Cooking  · Use low-heat cooking methods, such as baking, instead of high-heat cooking methods like deep frying.  · Cook using healthy oils, such as olive, canola, or sunflower oil.  · Avoid cooking with butter, cream, or high-fat meats.  Meal planning  · Eat meals and snacks regularly, preferably at the same times every day. Avoid going long periods of time without eating.  · Eat foods high in fiber, such as fresh fruits, vegetables, beans, and whole grains. Talk to your dietitian about how many servings of carbs you can eat at each meal.  · Eat 4-6 ounces (oz) of lean protein each day, such as lean meat, chicken, fish, eggs, or tofu. One oz of lean protein is equal to:  ? 1 oz of meat, chicken, or fish.  ? 1 egg.  ? ¼ cup of tofu.  · Eat some foods each day that contain healthy fats, such as avocado, nuts, seeds, and fish.  Lifestyle  · Check your blood glucose regularly.  · Exercise regularly as told by your health care provider. This may include:  ? 150 minutes of moderate-intensity or vigorous-intensity exercise each week. This could be brisk walking, biking, or water aerobics.  ? Stretching and doing strength exercises, such as yoga or weightlifting, at least 2 times a week.  · Take medicines as told by your health care provider.  · Do not use any products that contain nicotine or tobacco, such as cigarettes and e-cigarettes. If you need help quitting, ask your health care provider.  · Work with a counselor or diabetes educator to identify strategies to manage stress and any emotional and social challenges.  Questions to ask a health care provider  · Do I need to meet with a diabetes educator?  · Do I need to meet with a dietitian?  · What number can I call if I have questions?  · When are the best times to check my blood glucose?  Where to find more  information:  · American Diabetes Association: diabetes.org  · Academy of Nutrition and Dietetics: www.eatright.org  · National Institute of Diabetes and Digestive and Kidney Diseases (NIH): www.niddk.nih.gov  Summary  · A healthy meal plan will help you control your blood glucose and maintain a healthy lifestyle.  · Working with a diet and nutrition specialist (dietitian) can help you make a meal plan that is best for you.  · Keep in mind that carbohydrates (carbs) and alcohol have immediate effects on your blood glucose levels. It is important to count carbs and to use alcohol carefully.  This information is not intended to   replace advice given to you by your health care provider. Make sure you discuss any questions you have with your health care provider.  Document Released: 08/17/2005 Document Revised: 06/20/2017 Document Reviewed: 12/25/2016  Elsevier Interactive Patient Education © 2019 Elsevier Inc.

## 2019-02-26 LAB — COMPREHENSIVE METABOLIC PANEL
ALT: 42 IU/L — ABNORMAL HIGH (ref 0–32)
AST: 51 IU/L — ABNORMAL HIGH (ref 0–40)
Albumin/Globulin Ratio: 1.2 (ref 1.2–2.2)
Albumin: 3.4 g/dL — ABNORMAL LOW (ref 3.9–5.0)
Alkaline Phosphatase: 258 IU/L — ABNORMAL HIGH (ref 39–117)
BUN/Creatinine Ratio: 8 — ABNORMAL LOW (ref 9–23)
BUN: 4 mg/dL — ABNORMAL LOW (ref 6–20)
Bilirubin Total: <0.2 mg/dL (ref 0.0–1.2)
CO2: 18 mmol/L — ABNORMAL LOW (ref 20–29)
Calcium: 9.6 mg/dL (ref 8.7–10.2)
Chloride: 104 mmol/L (ref 96–106)
Creatinine, Ser: 0.5 mg/dL — ABNORMAL LOW (ref 0.57–1.00)
GFR calc Af Amer: 160 mL/min/{1.73_m2} (ref >59)
GFR calc non Af Amer: 139 mL/min/{1.73_m2} (ref >59)
Globulin, Total: 2.8 g/dL (ref 1.5–4.5)
Glucose: 93 mg/dL (ref 65–99)
Potassium: 4.3 mmol/L (ref 3.5–5.2)
SODIUM: 137 mmol/L (ref 134–144)
Total Protein: 6.2 g/dL (ref 6.0–8.5)

## 2019-02-26 LAB — TSH: TSH: 0.131 u[IU]/mL — ABNORMAL LOW (ref 0.450–4.500)

## 2019-02-26 LAB — HEPATITIS C ANTIBODY: Hep C Virus Ab: <0.1 s/co ratio (ref 0.0–0.9)

## 2019-02-26 LAB — HEPATITIS B SURFACE ANTIGEN: Hepatitis B Surface Ag: NEGATIVE

## 2019-02-28 ENCOUNTER — Other Ambulatory Visit: Payer: Self-pay | Admitting: Advanced Practice Midwife

## 2019-02-28 MED ORDER — LEVOTHYROXINE SODIUM 125 MCG PO TABS: 125.0000 ug | ORAL_TABLET | Freq: Every day | ORAL | 1 refills | Status: DC

## 2019-02-28 NOTE — Progress Notes (Signed)
TSH dropped significantly  Will change Synthroid from 150 to 125 and reassess

## 2019-03-03 ENCOUNTER — Telehealth: Payer: Self-pay

## 2019-03-03 NOTE — Telephone Encounter (Signed)
Patient called and made aware that we have decreased her synthroid to 125 mcg. Patient made aware of TSH result. Armandina Stammer RN

## 2019-03-03 NOTE — Telephone Encounter (Signed)
-----   Message from Aviva Signs, CNM sent at 02/28/2019 12:13 AM EDT ----- Regarding: need to decrease med TSH went down a lot  Needs to decrease her Synthroid from 150 to 125  Can you call her?  [facility]

## 2019-03-04 MED ORDER — CYCLOBENZAPRINE HCL 10 MG PO TABS: 10.0000 mg | ORAL_TABLET | Freq: Three times a day (TID) | ORAL | 1 refills | Status: DC | PRN

## 2019-03-17 MED ORDER — TERCONAZOLE 0.8 % VA CREA: 1.0000 | TOPICAL_CREAM | Freq: Every day | VAGINAL | 0 refills | Status: DC

## 2019-03-17 MED ORDER — SULFAMETHOXAZOLE-TRIMETHOPRIM 800-160 MG PO TABS: 1.0000 | ORAL_TABLET | Freq: Two times a day (BID) | ORAL | 1 refills | Status: DC

## 2019-03-18 ENCOUNTER — Encounter: Payer: Self-pay | Admitting: Advanced Practice Midwife

## 2019-03-18 ENCOUNTER — Ambulatory Visit (INDEPENDENT_AMBULATORY_CARE_PROVIDER_SITE_OTHER): Payer: Medicaid Other | Admitting: Advanced Practice Midwife

## 2019-03-18 DIAGNOSIS — O24419 Gestational diabetes mellitus in pregnancy, unspecified control: Secondary | ICD-10-CM

## 2019-03-18 DIAGNOSIS — O0990 Supervision of high risk pregnancy, unspecified, unspecified trimester: Secondary | ICD-10-CM

## 2019-03-18 DIAGNOSIS — O99283 Endocrine, nutritional and metabolic diseases complicating pregnancy, third trimester: Secondary | ICD-10-CM

## 2019-03-18 DIAGNOSIS — E039 Hypothyroidism, unspecified: Secondary | ICD-10-CM | POA: Diagnosis not present

## 2019-03-18 DIAGNOSIS — R945 Abnormal results of liver function studies: Secondary | ICD-10-CM

## 2019-03-18 DIAGNOSIS — Z3A36 36 weeks gestation of pregnancy: Secondary | ICD-10-CM

## 2019-03-18 NOTE — Patient Instructions (Signed)

## 2019-03-18 NOTE — Progress Notes (Signed)
PRENATAL VISIT NOTE   TELEHEALTH VISIT I connected with  this patient who is 6170w3d      03/18/19 by a Telephone telemedicine application and verified that I am speaking with the correct person using two identifiers.   I discussed the limitations of evaluation and management by telemedicine. The patient expressed understanding and agreed to proceed  Patient was at home I was in office. Armandina StammerJennifer Howard RN initiated call Total time spent 7 minutes..  Subjective:  Nichole SlackDiamond Fencl is a 23 y.o. G1P0 at 6270w3d being seen today for ongoing prenatal care.  She is currently monitored for the following issues for this high-risk pregnancy and has Constipation; CN (constipation); Supervision of high risk pregnancy, antepartum; Henoch-Schonlein purpura (HCC); Abnormal liver function; Asthma; Gastroesophageal reflux disease with esophagitis; Migraine; Subclinical hypothyroidism; Vitamin D deficiency; Gestational diabetes mellitus (GDM) affecting pregnancy, antepartum; Abnormal glucose tolerance test (GTT) during pregnancy, antepartum; and Hypothyroidism affecting pregnancy on their problem list.  Patient reports no complaints.  Contractions: Irregular. Vag. Bleeding: None.  Movement: Present. Denies leaking of fluid.   The following portions of the patient's history were reviewed and updated as appropriate: allergies, current medications, past family history, past medical history, past social history, past surgical history and problem list.   Objective:  There were no vitals filed for this visit.  Fetal Status:     Movement: Present     General:  Alert, oriented and cooperative. Patient is in no acute distress.  Skin: Skin is warm and dry. No rash noted.   Cardiovascular: Normal heart rate noted  Respiratory: Normal respiratory effort, no problems with respiration noted  Abdomen: Soft, gravid, appropriate for gestational age.  Pain/Pressure: Absent     Pelvic: Cervical exam deferred        Extremities:  Normal range of motion.  Edema: Trace  Mental Status: Normal mood and affect. Normal behavior. Normal judgment and thought content.   Assessment and Plan:  Pregnancy: G1P0 at 1170w3d 1. Supervision of high risk pregnancy, antepartum      Reviewed signs to watch for      Will come pick up BP cuff  2. Gestational diabetes mellitus (GDM) affecting pregnancy, antepartum     Reviewed glucose values.   She has been drinking juice boxes instead of water because she could not find bottled water in store due to crisis.  Does not want to drink tap water.  Discussed this may explain some of her values.  She will try to get a filter water pitcher and stop juices.  Will reevaluate. Discussed if values remain elevated may need meds, though I think this adjustment will help.  No problems following diet.    3. Hypothyroidism affecting pregnancy in third trimester     Last TSH is much lower than before (8.030 >> 0.131)      Reduced synthroid from 150mcg to 125.  Recheck 36 weeks  4. Abnormal liver function     Values greatly improved (65/80) down to 51/42     Hep B and C are negative     RUQ US was normal     If levels rise again, consider checking bile acids  Preterm labor symptoms and general obstetric precautions including but not limited to vaginal bleeding, contractions, leaking of fluid and fetal movement were reviewed in detail with the patient. Please refer to After Visit Summary for other counseling recommendations.   No follow-ups on file.  Future Appointments  Date Time Provider Department Center  03/25/2019 12:30  PM WH-MFC NURSE WH-MFC MFC-US  03/25/2019 12:30 PM WH-MFC [place] 1 WH-MFCUS MFC-US  04/15/2019 10:00 AM Aviva Signs, CNM CWH-WMHP None    Wynelle Bourgeois, CNM

## 2019-03-18 NOTE — Progress Notes (Signed)
FASTING BREAKFAST LUNCH DINNER  MON APR 06  Show notes 95  81  125  155   TUE APR 07  Show notes 91  120  125  135   WED APR 08  Show notes 83  130  135  135   THU APR 09  Show notes 93  113  97  120   FRI APR 10  Show notes 84  120  130     SAT APR 11  Show notes 95  125  167     SUN APR 12  Show notes 90  120  146  125        Patient made aware to begin taking a baby aspiring daily (81 mg) per Wynelle Bourgeois, CNM Armandina Stammer RN

## 2019-03-25 ENCOUNTER — Ambulatory Visit (HOSPITAL_COMMUNITY)
Admission: RE | Admit: 2019-03-25 | Discharge: 2019-03-25 | Disposition: A | Discharge status: Home / Self Care | Payer: Medicaid Other | Source: Ambulatory Visit | Attending: Obstetrics and Gynecology | Admitting: Obstetrics and Gynecology

## 2019-03-25 ENCOUNTER — Ambulatory Visit (HOSPITAL_COMMUNITY): Payer: Medicaid Other | Admitting: *Deleted

## 2019-03-25 ENCOUNTER — Encounter (HOSPITAL_COMMUNITY): Payer: Self-pay | Admitting: *Deleted

## 2019-03-25 ENCOUNTER — Other Ambulatory Visit: Payer: Self-pay

## 2019-03-25 DIAGNOSIS — O24419 Gestational diabetes mellitus in pregnancy, unspecified control: Secondary | ICD-10-CM | POA: Diagnosis present

## 2019-03-25 DIAGNOSIS — E039 Hypothyroidism, unspecified: Secondary | ICD-10-CM | POA: Diagnosis present

## 2019-03-25 DIAGNOSIS — Z3A33 33 weeks gestation of pregnancy: Secondary | ICD-10-CM

## 2019-03-25 DIAGNOSIS — O99283 Endocrine, nutritional and metabolic diseases complicating pregnancy, third trimester: Secondary | ICD-10-CM | POA: Diagnosis present

## 2019-03-25 DIAGNOSIS — Z362 Encounter for other antenatal screening follow-up: Secondary | ICD-10-CM | POA: Diagnosis not present

## 2019-03-25 DIAGNOSIS — O0990 Supervision of high risk pregnancy, unspecified, unspecified trimester: Secondary | ICD-10-CM | POA: Diagnosis present

## 2019-03-25 DIAGNOSIS — O99213 Obesity complicating pregnancy, third trimester: Secondary | ICD-10-CM

## 2019-03-27 ENCOUNTER — Other Ambulatory Visit (HOSPITAL_COMMUNITY): Payer: Self-pay | Admitting: *Deleted

## 2019-03-27 DIAGNOSIS — O288 Other abnormal findings on antenatal screening of mother: Secondary | ICD-10-CM

## 2019-03-28 ENCOUNTER — Telehealth: Payer: Self-pay | Admitting: *Deleted

## 2019-03-28 NOTE — Telephone Encounter (Signed)
[employer] called to report that they received an alert for a critical value for the pt's blood sugar but were unable to see the value itself.

## 2019-04-01 ENCOUNTER — Telehealth: Payer: Self-pay

## 2019-04-01 ENCOUNTER — Encounter (HOSPITAL_COMMUNITY): Payer: Self-pay

## 2019-04-01 ENCOUNTER — Ambulatory Visit (HOSPITAL_COMMUNITY)
Admission: RE | Admit: 2019-04-01 | Discharge: 2019-04-01 | Disposition: A | Discharge status: Home / Self Care | Payer: Medicaid Other | Source: Ambulatory Visit | Attending: Maternal & Fetal Medicine | Admitting: Maternal & Fetal Medicine

## 2019-04-01 ENCOUNTER — Encounter: Payer: Self-pay | Admitting: Advanced Practice Midwife

## 2019-04-01 ENCOUNTER — Ambulatory Visit (HOSPITAL_COMMUNITY): Payer: Medicaid Other | Admitting: *Deleted

## 2019-04-01 ENCOUNTER — Ambulatory Visit (INDEPENDENT_AMBULATORY_CARE_PROVIDER_SITE_OTHER): Payer: Medicaid Other | Admitting: Advanced Practice Midwife

## 2019-04-01 ENCOUNTER — Other Ambulatory Visit: Payer: Self-pay

## 2019-04-01 VITALS — BP 131/81 | HR 103 | Wt 232.0 lb

## 2019-04-01 VITALS — BP 140/83 | HR 81 | Temp 98.6°F

## 2019-04-01 DIAGNOSIS — O0990 Supervision of high risk pregnancy, unspecified, unspecified trimester: Secondary | ICD-10-CM | POA: Insufficient documentation

## 2019-04-01 DIAGNOSIS — O288 Other abnormal findings on antenatal screening of mother: Secondary | ICD-10-CM | POA: Diagnosis present

## 2019-04-01 DIAGNOSIS — O24419 Gestational diabetes mellitus in pregnancy, unspecified control: Secondary | ICD-10-CM

## 2019-04-01 DIAGNOSIS — O368130 Decreased fetal movements, third trimester, not applicable or unspecified: Secondary | ICD-10-CM | POA: Diagnosis not present

## 2019-04-01 DIAGNOSIS — E039 Hypothyroidism, unspecified: Secondary | ICD-10-CM

## 2019-04-01 DIAGNOSIS — Z362 Encounter for other antenatal screening follow-up: Secondary | ICD-10-CM

## 2019-04-01 DIAGNOSIS — O99283 Endocrine, nutritional and metabolic diseases complicating pregnancy, third trimester: Secondary | ICD-10-CM | POA: Diagnosis present

## 2019-04-01 DIAGNOSIS — Z3A34 34 weeks gestation of pregnancy: Secondary | ICD-10-CM

## 2019-04-01 DIAGNOSIS — O4100X0 Oligohydramnios, unspecified trimester, not applicable or unspecified: Secondary | ICD-10-CM | POA: Insufficient documentation

## 2019-04-01 MED ORDER — METFORMIN HCL 500 MG PO TABS: 500.0000 mg | ORAL_TABLET | Freq: Every day | ORAL | 2 refills | Status: DC

## 2019-04-01 NOTE — Progress Notes (Signed)
   PRENATAL VISIT NOTE  Subjective:  Nichole Bush is a 23 y.o. G1P0 at [redacted]w[redacted]d being seen today for ongoing prenatal care.  She is currently monitored for the following issues for this high-risk pregnancy and has Constipation; CN (constipation); Supervision of high risk pregnancy, antepartum; Henoch-Schonlein purpura (HCC); Abnormal liver function; Asthma; Gastroesophageal reflux disease with esophagitis; Migraine; Subclinical hypothyroidism; Vitamin D deficiency; Gestational diabetes mellitus (GDM) affecting pregnancy, antepartum; Abnormal glucose tolerance test (GTT) during pregnancy, antepartum; and Hypothyroidism affecting pregnancy on their problem list.  Patient reports elevated BP and decreased fetal movement.  Contractions: Irritability. Vag. Bleeding: None.  Movement: (!) Decreased. Denies leaking of fluid.   States FBS have been good (one value elevated to 99) 2 hr PCs mostly ok withfew elevations when she snacked after dinner but checked at the 2 hour post meal time, and once went very high after watermelon  FBS: 84,16,60,63, 95,86,79 B:  79,120,107,90,170,90,91 L:  122,137,126,130,140,107,150  The following portions of the patient's history were reviewed and updated as appropriate: allergies, current medications, past family history, past medical history, past social history, past surgical history and problem list.   Objective:   Vitals:   04/01/19 0934  BP: 131/81  Pulse: (!) 103  Weight: 105.2 kg    Fetal Status:     Movement: (!) Decreased     General:  Alert, oriented and cooperative. Patient is in no acute distress.  Skin: Skin is warm and dry. No rash noted.   Cardiovascular: Normal heart rate noted  Respiratory: Normal respiratory effort, no problems with respiration noted  Abdomen: Soft, gravid, appropriate for gestational age.  Pain/Pressure: Present     Pelvic: Cervical exam deferred        Extremities: Normal range of motion.  Edema: Mild pitting, slight  indentation  Mental Status: Normal mood and affect. Normal behavior. Normal judgment and thought content.   Assessment and Plan:  Pregnancy: G1P0 at [redacted]w[redacted]d 1. Hypothyroidism, unspecified type Check TSH today - TSH  2.   GDM    Discussed diet    Will add Metformin 500mg  qhs  If not improved add insulin    Consulted Dr Constant  Preterm labor symptoms and general obstetric precautions including but not limited to vaginal bleeding, contractions, leaking of fluid and fetal movement were reviewed in detail with the patient. Please refer to After Visit Summary for other counseling recommendations.    Future Appointments  Date Time Provider Department Center  04/01/2019  1:15 PM [facility] NURSE [facility] M[facility]-US  04/01/2019  1:15 PM WH-MFC [place] 4 WH-MFCUS MFC-US  04/15/2019 10:00 AM Aviva Signs, CNM C[facility]-WMHP None    Wynelle Bourgeois, CNM

## 2019-04-01 NOTE — Telephone Encounter (Signed)
Patient send provider a note that her bp has been high and she hasnt felt baby move as much as normal.  Called patient and instructed her to come in now for evaluation with provider. Patient agrees and placed on schedule. Armandina Stammer RN

## 2019-04-01 NOTE — Progress Notes (Signed)
  MON APR 20     Fasting  94  Breakfast 79   lunch 122  Dinner    APR 21                                                                   94  120  137  134   APR 22                                                                      74  107  126  150   APR 23                                                                     94  90  130  119    APR 24                                                                      95  170  140  140    APR 25                                                                       86  90  107     APR 26                                                                         79  91  150

## 2019-04-01 NOTE — Patient Instructions (Signed)
Diabetes mellitus y actividad fsica  Diabetes Mellitus and Exercise  Hacer actividad fsica habitualmente es importante para el estado de salud general, en especial si tiene diabetes (diabetes mellitus). La actividad fsica no solo se reduce a bajar de peso. Aporta muchos beneficios para la salud, como aumento de la fuerza muscular y la densidad sea, y reduccin de las grasas corporales y el estrs. Esto mejora el estado fsico, la flexibilidad y la resistencia, y todo ello redunda en un mejor estado de salud general.  La actividad fsica tiene beneficios adicionales para los diabticos, entre ellos:   Disminuye el apetito.   Ayuda a bajar y mantener la glucemia bajo control.   Baja la presin arterial.   Ayuda a controlar las cantidades de sustancias grasas (lpidos) en la sangre, como el colesterol y los triglicridos.   Mejora la respuesta del cuerpo a la insulina (optimizacin de la sensibilidad a la insulina).   Reduce la cantidad de insulina que el cuerpo necesita.   Reduce el riesgo de sufrir cardiopata coronaria de la siguiente forma:  ? Baja los niveles de colesterol y triglicridos.  ? Aumenta los niveles de colesterol bueno.  ? Disminuye la glucemia.  Cul es mi plan de actividad?  El mdico o un educador para la diabetes certificado pueden ayudarlo a elaborar un plan respecto del tipo y de la frecuencia de actividad fsica (plan de actividades) adecuado para usted. Asegrese de lo siguiente:   Haga por lo menos 150minutos semanales de ejercicios de intensidad moderada o vigorosa. Estos podran ser caminatas dinmicas, ciclismo o gimnasia acutica.  ? Haga ejercicios de elongacin y de fortalecimiento, como yoga o levantamiento de pesas, por lo menos 2veces por semana.  ? Reparta la actividad en al menos 3das de la semana.   Haga algn tipo de actividad fsica todos los das.  ? No deje pasar ms de 2das seguidos sin hacer algn tipo de actividad fsica.  ? Evite permanecer inactivo  durante ms de 30minutos seguidos. Tmese descansos frecuentes para caminar o estirarse.   Elija un tipo de ejercicio o de actividad que disfrute y establezca objetivos realistas.   Comience lentamente y aumente de manera gradual la intensidad del ejercicio con el correr del tiempo.  Qu debo saber acerca del control de la diabetes?     Contrlese la glucemia antes y despus de ejercitarse.  ? Si la glucemia es de 240mg/dl (13,3mmol/l) o ms antes de comenzar a hacer actividad fsica, controle la orina para detectar la presencia de cetonas. Si tiene cetonas en la orina, no haga ejercicio hasta que la glucemia se normalice.  ? Si la glucemia es de 100 mg/dl (5.6 mmol/l) o menos, tome una colacin que contenga entre 15 y 20 gramos de carbohidratos. Controle la glucemia 15 minutos despus de la colacin para asegurarse de que el nivel est por encima de 100 mg/dl (5.6 mmol/l) antes de comenzar a hacer actividad fsica.   Conozca los sntomas de la glucemia baja (hipoglucemia) y aprenda cmo tratarla. El riesgo de tener hipoglucemia aumenta durante y despus de hacer actividad fsica. Los sntomas frecuentes de hipoglucemia pueden incluir los siguientes:  ? Hambre.  ? Ansiedad.  ? Sudoracin y piel hmeda.  ? Confusin.  ? Mareos o sensacin de desvanecimiento.  ? Aumento de la frecuencia cardaca o palpitaciones.  ? Visin borrosa.  ? Hormigueo o adormecimiento alrededor de la boca, los labios o la lengua.  ? Estremecimientos y temblores.  ? Irritabilidad.   Tenga una colacin   de carbohidratos de accin rpida disponible antes, durante y despus de ejercitarse, a fin de evitar o tratar la hipoglucemia.   Evite inyectarse insulina en las zonas del cuerpo que ejercitar. Por ejemplo, evite inyectarse insulina en:  ? Los brazos, si juega al tenis.  ? Las piernas, si corre.   Lleve registros de sus hbitos de actividad fsica. Esto puede ayudarlos a usted y al mdico a adaptar el plan de control de la diabetes  segn sea necesario. Escriba los siguientes datos:  ? Los alimentos que consume antes y despus de hacer actividad fsica.  ? Los niveles de glucosa en la sangre antes y despus de hacer ejericios.  ? El tipo y cantidad de actividad fsica que realiza.  ? Cuando se prev que la insulina alcance su valor mximo, si usa insulina. No haga actividad fsica en los momentos en que insulina alcanza su valor mximo.   Cuando comience un ejercicio o una actividad nuevos, trabaje con el mdico para asegurarse de que la actividad sea segura para usted y para ajustar la insulina, los medicamentos o la ingesta de alimentos segn sea necesario.   Beba gran cantidad de agua mientras hace ejercicio para evitar la deshidratacin o los golpes de calor. Beba suficiente lquido como para mantener la orina clara o de color amarillo plido.  Resumen   Hacer actividad fsica habitualmente es importante para el estado de salud general, en especial si tiene diabetes (diabetes mellitus).   La actividad fsica aporta muchos beneficios para la salud, como aumentar la fuerza muscular y la densidad sea, y reducir las grasas corporales y el estrs.   El mdico o un educador para la diabetes certificado pueden ayudarlo a elaborar un plan respecto del tipo y de la frecuencia de actividad fsica (plan de actividades) adecuado para usted.   Cuando comience un ejercicio o una actividad nuevos, trabaje con el mdico para asegurarse de que la actividad sea segura para usted y para ajustar la insulina, los medicamentos o la ingesta de alimentos segn sea necesario.  Esta informacin no tiene como fin reemplazar el consejo del mdico. Asegrese de hacerle al mdico cualquier pregunta que tenga.  Document Released: 12/10/2007 Document Revised: 09/17/2017 Document Reviewed: 05/01/2016  Elsevier Interactive Patient Education  2019 [employer]

## 2019-04-02 ENCOUNTER — Telehealth: Payer: Self-pay

## 2019-04-02 ENCOUNTER — Other Ambulatory Visit (HOSPITAL_COMMUNITY): Payer: Self-pay | Admitting: *Deleted

## 2019-04-02 DIAGNOSIS — O24410 Gestational diabetes mellitus in pregnancy, diet controlled: Secondary | ICD-10-CM

## 2019-04-02 NOTE — Telephone Encounter (Signed)
Patient made aware that we received her my chart message and fowarded it to Dr Adrian Blackwater.  Patient also made aware that Wynelle Bourgeois, CNM wants her to add Metformin at bedtime to help with her fasting sugars. Informed patient to pick up medication at her pharmacy and begin it this evening. Patient states understanding Armandina Stammer RN

## 2019-04-02 NOTE — Telephone Encounter (Signed)
-----   Message from Aviva Signs, CNM sent at 04/01/2019 12:13 PM EDT ----- Regarding: add metformin I prescribed metformin 500mg  at night for fasting sugars  Can you tell her?  I told her it might happen  [address]

## 2019-04-07 ENCOUNTER — Other Ambulatory Visit: Payer: Self-pay

## 2019-04-07 ENCOUNTER — Ambulatory Visit (INDEPENDENT_AMBULATORY_CARE_PROVIDER_SITE_OTHER): Payer: Medicaid Other | Admitting: Family Medicine

## 2019-04-07 VITALS — BP 132/87 | HR 95

## 2019-04-07 DIAGNOSIS — E039 Hypothyroidism, unspecified: Secondary | ICD-10-CM

## 2019-04-07 DIAGNOSIS — O0990 Supervision of high risk pregnancy, unspecified, unspecified trimester: Secondary | ICD-10-CM

## 2019-04-07 DIAGNOSIS — R030 Elevated blood-pressure reading, without diagnosis of hypertension: Secondary | ICD-10-CM

## 2019-04-07 DIAGNOSIS — O24419 Gestational diabetes mellitus in pregnancy, unspecified control: Secondary | ICD-10-CM

## 2019-04-07 MED ORDER — WRIST SPLINT/COCK-UP/LEFT SM MISC: 1.0000 [IU] | Freq: Every day | 0 refills | Status: DC

## 2019-04-07 NOTE — Progress Notes (Signed)
Patient here for BP check. BP normal. Having some left wrist pain, consistent with her previous tendonitis - wrist splint prescribed.   Check TSH level (losing some hair) Check CBC, CMP.  Has BPP on Thursday.

## 2019-04-07 NOTE — Progress Notes (Signed)
Patient presents for BP check after she had a increase BP at [facility] last week. Armandina Stammer RN

## 2019-04-08 ENCOUNTER — Inpatient Hospital Stay (HOSPITAL_COMMUNITY)
Admission: AD | Admit: 2019-04-08 | Discharge: 2019-04-13 | DRG: 788 | Disposition: A | Discharge status: Home / Self Care | Payer: Medicaid Other | Attending: Family Medicine | Admitting: Family Medicine

## 2019-04-08 ENCOUNTER — Encounter (HOSPITAL_COMMUNITY): Payer: Self-pay | Admitting: *Deleted

## 2019-04-08 ENCOUNTER — Other Ambulatory Visit: Payer: Self-pay

## 2019-04-08 DIAGNOSIS — O9952 Diseases of the respiratory system complicating childbirth: Secondary | ICD-10-CM | POA: Diagnosis present

## 2019-04-08 DIAGNOSIS — O1414 Severe pre-eclampsia complicating childbirth: Principal | ICD-10-CM | POA: Diagnosis present

## 2019-04-08 DIAGNOSIS — O4100X0 Oligohydramnios, unspecified trimester, not applicable or unspecified: Secondary | ICD-10-CM | POA: Diagnosis present

## 2019-04-08 DIAGNOSIS — O0990 Supervision of high risk pregnancy, unspecified, unspecified trimester: Secondary | ICD-10-CM

## 2019-04-08 DIAGNOSIS — O99283 Endocrine, nutritional and metabolic diseases complicating pregnancy, third trimester: Secondary | ICD-10-CM

## 2019-04-08 DIAGNOSIS — O99214 Obesity complicating childbirth: Secondary | ICD-10-CM | POA: Diagnosis present

## 2019-04-08 DIAGNOSIS — O99284 Endocrine, nutritional and metabolic diseases complicating childbirth: Secondary | ICD-10-CM | POA: Diagnosis present

## 2019-04-08 DIAGNOSIS — R945 Abnormal results of liver function studies: Secondary | ICD-10-CM | POA: Diagnosis present

## 2019-04-08 DIAGNOSIS — O24419 Gestational diabetes mellitus in pregnancy, unspecified control: Secondary | ICD-10-CM | POA: Diagnosis present

## 2019-04-08 DIAGNOSIS — E039 Hypothyroidism, unspecified: Secondary | ICD-10-CM | POA: Diagnosis present

## 2019-04-08 DIAGNOSIS — O99280 Endocrine, nutritional and metabolic diseases complicating pregnancy, unspecified trimester: Secondary | ICD-10-CM

## 2019-04-08 DIAGNOSIS — O24425 Gestational diabetes mellitus in childbirth, controlled by oral hypoglycemic drugs: Secondary | ICD-10-CM | POA: Diagnosis present

## 2019-04-08 DIAGNOSIS — J45909 Unspecified asthma, uncomplicated: Secondary | ICD-10-CM | POA: Diagnosis present

## 2019-04-08 DIAGNOSIS — Z880 Allergy status to penicillin: Secondary | ICD-10-CM

## 2019-04-08 DIAGNOSIS — E559 Vitamin D deficiency, unspecified: Secondary | ICD-10-CM | POA: Diagnosis present

## 2019-04-08 DIAGNOSIS — O1410 Severe pre-eclampsia, unspecified trimester: Secondary | ICD-10-CM | POA: Diagnosis present

## 2019-04-08 DIAGNOSIS — Z3A35 35 weeks gestation of pregnancy: Secondary | ICD-10-CM

## 2019-04-08 LAB — COMPREHENSIVE METABOLIC PANEL
ALT: 22 IU/L (ref 0–32)
ALT: 23 U/L (ref 0–44)
AST: 31 IU/L (ref 0–40)
AST: 33 U/L (ref 15–41)
Albumin/Globulin Ratio: 1.3 (ref 1.2–2.2)
Albumin: 3.2 g/dL — ABNORMAL LOW (ref 3.9–5.0)
Albumin: 2.4 g/dL — ABNORMAL LOW (ref 3.5–5.0)
Alkaline Phosphatase: 301 IU/L — ABNORMAL HIGH (ref 39–117)
Alkaline Phosphatase: 316 U/L — ABNORMAL HIGH (ref 38–126)
Anion gap: 3 — ABNORMAL LOW (ref 5–15)
BUN/Creatinine Ratio: 11 (ref 9–23)
BUN: 5 mg/dL — ABNORMAL LOW (ref 6–20)
BUN: 5 mg/dL — ABNORMAL LOW (ref 6–20)
Bilirubin Total: <0.2 mg/dL (ref 0.0–1.2)
CO2: 18 mmol/L — ABNORMAL LOW (ref 20–29)
CO2: 28 mmol/L (ref 22–32)
Calcium: 9.2 mg/dL (ref 8.7–10.2)
Calcium: 9.4 mg/dL (ref 8.9–10.3)
Chloride: 107 mmol/L — ABNORMAL HIGH (ref 96–106)
Chloride: 106 mmol/L (ref 98–111)
Creatinine, Ser: 0.47 mg/dL — ABNORMAL LOW (ref 0.57–1.00)
Creatinine, Ser: 0.56 mg/dL (ref 0.44–1.00)
GFR calc Af Amer: 163 mL/min/{1.73_m2} (ref >59)
GFR calc Af Amer: >60 mL/min (ref >60)
GFR calc non Af Amer: 142 mL/min/{1.73_m2} (ref >59)
GFR calc non Af Amer: >60 mL/min (ref >60)
Globulin, Total: 2.5 g/dL (ref 1.5–4.5)
Glucose, Bld: 74 mg/dL (ref 70–99)
Glucose: 78 mg/dL (ref 65–99)
Potassium: 4 mmol/L (ref 3.5–5.2)
Potassium: 3.9 mmol/L (ref 3.5–5.1)
Sodium: 138 mmol/L (ref 134–144)
Sodium: 137 mmol/L (ref 135–145)
Total Bilirubin: 0.3 mg/dL (ref 0.3–1.2)
Total Protein: 5.7 g/dL — ABNORMAL LOW (ref 6.0–8.5)
Total Protein: 6 g/dL — ABNORMAL LOW (ref 6.5–8.1)

## 2019-04-08 LAB — CBC
HCT: 32.8 % — ABNORMAL LOW (ref 36.0–46.0)
Hematocrit: 32.9 % — ABNORMAL LOW (ref 34.0–46.6)
Hemoglobin: 11.3 g/dL (ref 11.1–15.9)
Hemoglobin: 11.1 g/dL — ABNORMAL LOW (ref 12.0–15.0)
MCH: 30 pg (ref 26.6–33.0)
MCH: 30 pg (ref 26.0–34.0)
MCHC: 34.3 g/dL (ref 31.5–35.7)
MCHC: 33.8 g/dL (ref 30.0–36.0)
MCV: 87 fL (ref 79–97)
MCV: 88.6 fL (ref 80.0–100.0)
Platelets: 194 10*3/uL (ref 150–450)
Platelets: 187 10*3/uL (ref 150–400)
RBC: 3.77 x10E6/uL (ref 3.77–5.28)
RBC: 3.7 MIL/uL — ABNORMAL LOW (ref 3.87–5.11)
RDW: 13.8 % (ref 11.7–15.4)
RDW: 13.6 % (ref 11.5–15.5)
WBC: 6.8 10*3/uL (ref 3.4–10.8)
WBC: 8.1 10*3/uL (ref 4.0–10.5)
nRBC: 0.4 % — ABNORMAL HIGH (ref 0.0–0.2)

## 2019-04-08 LAB — URINALYSIS, ROUTINE W REFLEX MICROSCOPIC
Bilirubin Urine: NEGATIVE
Glucose, UA: NEGATIVE mg/dL
Ketones, ur: NEGATIVE mg/dL
Leukocytes,Ua: NEGATIVE
Nitrite: NEGATIVE
Protein, ur: 100 mg/dL — AB
Specific Gravity, Urine: 1.021 (ref 1.005–1.030)
pH: 7 (ref 5.0–8.0)

## 2019-04-08 LAB — TSH: TSH: 6.62 u[IU]/mL — ABNORMAL HIGH (ref 0.450–4.500)

## 2019-04-08 MED ORDER — HYDROCORTISONE ACETATE 25 MG RE SUPP: 25.0000 mg | Freq: Two times a day (BID) | RECTAL | 1 refills | Status: DC

## 2019-04-08 MED ORDER — ACETAMINOPHEN 500 MG PO TABS
1000.0000 mg | ORAL_TABLET | Freq: Once | ORAL | Status: AC
  Administered 2019-04-09: 1000 mg via ORAL
  Filled 2019-04-08: qty 2

## 2019-04-08 NOTE — MAU Provider Note (Signed)
Chief Complaint:  Hypertension   First Provider Initiated Contact with Patient 04/08/19 2220      HPI: Nichole Bush is a 23 y.o. G1P0 at [id number] presents to maternity admissions reporting elevated blood pressure at home, headache, and visual changes.. States the headache started yesterday and then got better in the evening.  But the headache has continued today.   Took her BP and it was high. States Dr Adrian BlackwaterStinson called her to come in.  She reports good fetal movement, denies LOF, vaginal bleeding, vaginal itching/burning, urinary symptoms, dizziness, n/v, diarrhea, constipation or fever/chills.  She denies RUQ abdominal pain.  RN Note: Pt states her bp at home was 147/97, she reports a headache, flashes and blurred vision. Sthe took one capful of childrens liquid tylenol at 1800 without much relief. She says that her baby is moving well and she denies any vag bleeding or leaking.  Past Medical History: Past Medical History:  Diagnosis Date  . Constipation   . Gestational diabetes   . 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 Hypertension   . Migraine   . Thyroid disease     Past obstetric history: OB History  Gravida Para Term Preterm AB Living  1         0  SAB TAB Ectopic Multiple Live Births               # Outcome Date GA Lbr Len/2nd Weight Sex Delivery Anes PTL Lv  1 Current             Past Surgical History: Past Surgical History:  Procedure Laterality Date  . ESOPHAGOGASTRODUODENOSCOPY    . WISDOM TOOTH EXTRACTION      Family History: Family History  Problem Relation Age of Onset  . Migraines Father   . Hypertension Father     Social History: Social History   Tobacco Use  . Smoking status: Never Smoker  . Smokeless tobacco: Never Used  Substance Use Topics  . Alcohol use: No    Alcohol/week: 0.0 standard drinks  . Drug use: No    Allergies:  Allergies  Allergen Reactions  . Peanut-Containing Drug Products Anaphylaxis  . Penicillins Anaphylaxis,  Hives and Other (See Comments)    Has patient had a PCN reaction causing immediate rash, facial/tongue/throat swelling, SOB or lightheadedness with hypotension: Yes Has patient had a PCN reaction causing severe rash involving mucus membranes or skin necrosis: No Has patient had a PCN reaction that required hospitalization No Has patient had a PCN reaction occurring within the last 10 years: No If all of the above answers are "NO", then may proceed with Cephalosporin use.   Marland Kitchen. Peanut Oil Hives    Meds:  Medications Prior to Admission  Medication Sig Dispense Refill Last Dose  . Accu-Chek FastClix Lancets MISC 1 Device by Percutaneous route 4 (four) times daily. [id number] check BS QID 100 each 12 04/08/2019 at Unknown time  . Butenafine HCl (LOTRIMIN ULTRA) 1 % cream Apply 1 Dose topically 2 (two) times daily. 60 g 1 Past Month at Unknown time  . cyclobenzaprine (FLEXERIL) 10 MG tablet Take 1 tablet (10 mg total) by mouth every 8 (eight) hours as needed (headaches). 30 tablet 1 Past Month at Unknown time  . Elastic Bandages & Supports (WRIST SPLINT/COCK-UP/LEFT SM) MISC 1 Units by Does not apply route daily. 1 each 0 Past Month at Unknown time  . hydrocortisone (ANUSOL-HC) 25 MG suppository Place 1 suppository (25 mg total) rectally 2 (two) times  daily. 12 suppository 1 04/08/2019 at Unknown time  . levothyroxine (SYNTHROID, LEVOTHROID) 125 MCG tablet TAKE 1 TABLET(125 MCG) BY MOUTH DAILY BEFORE BREAKFAST 90 tablet 1 04/08/2019 at Unknown time  . metFORMIN (GLUCOPHAGE) 500 MG tablet Take 1 tablet (500 mg total) by mouth at bedtime. 30 tablet 2 04/07/2019 at Unknown time  . sulfamethoxazole-trimethoprim (BACTRIM DS,SEPTRA DS) 800-160 MG tablet Take 1 tablet by mouth 2 (two) times daily. 14 tablet 1 Past Month at Unknown time  . terconazole (TERAZOL 3) 0.8 % vaginal cream Place 1 applicator vaginally at bedtime. Apply nightly for three nights. 20 g 0 Past Month at Unknown time  . albuterol (PROVENTIL  HFA;VENTOLIN HFA) 108 (90 Base) MCG/ACT inhaler Inhale into the lungs.   More than a month at Unknown time  . DICLEGIS 10-10 MG TBEC TK 2 TS PO HS FOR 10 DAYS  0 More than a month at Unknown time  . docusate sodium (COLACE) 100 MG capsule Colace 100 mg capsule  Take 1 capsule every day by oral route.   More than a month at Unknown time  . glucose blood (ACCU-CHEK GUIDE) test strip DX:024.419 check BS QID 100 each 12 Taking  . hydrOXYzine (VISTARIL) 25 MG capsule Take 1 capsule (25 mg total) by mouth 3 (three) times daily as needed for anxiety. 30 capsule 0 More than a month at Unknown time  . linaclotide (LINZESS) 290 MCG CAPS capsule Take 290 mcg by mouth daily before breakfast.   More than a month at Unknown time  . Prenatal MV-Min-Fe Fum-FA-DHA (PRENATAL 1 PO) Take by mouth.   More than a month at Unknown time  . SUMAtriptan (IMITREX) 25 MG tablet    More than a month at Unknown time    I have reviewed patient's Past Medical Hx, Surgical Hx, Family Hx, Social Hx, medications and allergies.   ROS:  Review of Systems  Constitutional: Negative for chills and fever.  Eyes: Positive for visual disturbance.  Respiratory: Negative for shortness of breath.   Cardiovascular: Positive for leg swelling (worse yesterday).  Gastrointestinal: Negative for abdominal pain, constipation, diarrhea, nausea and vomiting.  Genitourinary: Negative for vaginal bleeding.  Musculoskeletal: Negative for back pain.  Neurological: Positive for headaches. Negative for dizziness and weakness.   Other systems negative  Physical Exam   Patient Vitals for the past 24 hrs:  BP Temp Temp src Pulse Height Weight  04/08/19 2154 (!) 149/87 98.3 F (36.8 C) Oral 89  (1.575 m) 108.4 kg   Constitutional: Well-developed, well-nourished female in no acute distress.  Cardiovascular: normal rate and rhythm Respiratory: normal effort, clear to auscultation bilaterally GI: Abd soft, non-tender, gravid appropriate for  gestational age.   No rebound or guarding. MS: Extremities nontender, Trace to 1+ ankle/pedal edema, normal ROM Neurologic: Alert and oriented x 4.   DTRs 2+ with no clonus GU: Neg CVAT.  PELVIC EXAM: Deferred  FHT:  Baseline 140 , moderate variability, accelerations present, no decelerations (?variable at start of tracing) Contractions:  Rare   Labs: Results for orders placed or performed during the hospital encounter of 04/08/19 (from the past 24 hour(s))  CBC     Status: Abnormal   Collection Time: 04/08/19 10:09 PM  Result Value Ref Range   WBC 8.1 4.0 - 10.5 K/uL   RBC 3.70 (L) 3.87 - 5.11 MIL/uL   Hemoglobin 11.1 (L) 12.0 - 15.0 g/dL   HCT 16.1 (L) 09.6 - 04.5 %   MCV 88.6 80.0 - 100.0  fL   MCH 30.0 26.0 - 34.0 pg   MCHC 33.8 30.0 - 36.0 g/dL   RDW 29.0 21.1 - 15.5 %   Platelets 187 150 - 400 K/uL   nRBC 0.4 (H) 0.0 - 0.2 %  Comprehensive metabolic panel     Status: Abnormal   Collection Time: 04/08/19 10:09 PM  Result Value Ref Range   Sodium 137 135 - 145 mmol/L   Potassium 3.9 3.5 - 5.1 mmol/L   Chloride 106 98 - 111 mmol/L   CO2 28 22 - 32 mmol/L   Glucose, Bld 74 70 - 99 mg/dL   BUN 5 (L) 6 - 20 mg/dL   Creatinine, Ser 2.08 0.44 - 1.00 mg/dL   Calcium 9.4 8.9 - 02.2 mg/dL   Total Protein 6.0 (L) 6.5 - 8.1 g/dL   Albumin 2.4 (L) 3.5 - 5.0 g/dL   AST 33 15 - 41 U/L   ALT 23 0 - 44 U/L   Alkaline Phosphatase 316 (H) 38 - 126 U/L   Total Bilirubin 0.3 0.3 - 1.2 mg/dL   GFR calc non Af Amer >60 >60 mL/min   GFR calc Af Amer >60 >60 mL/min   Anion gap 3 (L) 5 - 15  Protein / creatinine ratio, urine     Status: Abnormal   Collection Time: 04/08/19 10:18 PM  Result Value Ref Range   Creatinine, Urine 140.74 mg/dL   Total Protein, Urine 164 mg/dL   Protein Creatinine Ratio 1.17 (H) 0.00 - 0.15 mg/mg[Cre]  Urinalysis, Routine w reflex microscopic     Status: Abnormal   Collection Time: 04/08/19 11:25 PM  Result Value Ref Range   Color, Urine YELLOW YELLOW    APPearance HAZY (A) CLEAR   Specific Gravity, Urine 1.021 1.005 - 1.030   pH 7.0 5.0 - 8.0   Glucose, UA NEGATIVE NEGATIVE mg/dL   Hgb urine dipstick SMALL (A) NEGATIVE   Bilirubin Urine NEGATIVE NEGATIVE   Ketones, ur NEGATIVE NEGATIVE mg/dL   Protein, ur 336 (A) NEGATIVE mg/dL   Nitrite NEGATIVE NEGATIVE   Leukocytes,Ua NEGATIVE NEGATIVE   RBC / HPF 0-5 0 - 5 RBC/hpf   WBC, UA 0-5 0 - 5 WBC/hpf   Bacteria, UA RARE (A) NONE SEEN   Squamous Epithelial / LPF 0-5 0 - 5   Mucus PRESENT    Hyaline Casts, UA PRESENT     Imaging:    MAU Course/MDM: I have ordered labs and reviewed results. Preeclampsia labs ordered >> Protein/Creat ratio elevated NST reviewed and is reassurinig Consult Dr Adrian Blackwater with presentation, exam findings and test results.  Treatments in MAU included Tylenol for headache which was not relieved completely.  Went from a "5-6" to a "4-5".  Still has "glare" in vision Will admit for MgSO4 and delivery  Assessment: 1. Hypothyroidism affecting pregnancy in third trimester   2. Gestational diabetes mellitus (GDM) affecting pregnancy, antepartum   3. Supervision of high risk pregnancy, antepartum   4.     Preeclampsia  Plan: Admit to Labor and Delivery Routine orders MD to follow   Wynelle Bourgeois CNM, MSN Certified Nurse-Midwife 04/08/2019 10:20 PM

## 2019-04-08 NOTE — MAU Note (Signed)
Pt states her bp at home was 147/97, she reports a headache, flashes and blurred vision. Sthe took one capful of childrens liquid tylenol at 1800 without much relief. She says that her baby is moving well and she denies any vag bleeding or leaking.

## 2019-04-09 ENCOUNTER — Encounter (HOSPITAL_COMMUNITY): Payer: Self-pay

## 2019-04-09 ENCOUNTER — Inpatient Hospital Stay (HOSPITAL_COMMUNITY): Payer: Medicaid Other

## 2019-04-09 ENCOUNTER — Ambulatory Visit (HOSPITAL_COMMUNITY): Payer: Medicaid Other

## 2019-04-09 ENCOUNTER — Other Ambulatory Visit: Payer: Self-pay

## 2019-04-09 DIAGNOSIS — E039 Hypothyroidism, unspecified: Secondary | ICD-10-CM | POA: Diagnosis present

## 2019-04-09 DIAGNOSIS — Z3A35 35 weeks gestation of pregnancy: Secondary | ICD-10-CM | POA: Diagnosis not present

## 2019-04-09 DIAGNOSIS — O24425 Gestational diabetes mellitus in childbirth, controlled by oral hypoglycemic drugs: Secondary | ICD-10-CM | POA: Diagnosis present

## 2019-04-09 DIAGNOSIS — O99284 Endocrine, nutritional and metabolic diseases complicating childbirth: Secondary | ICD-10-CM | POA: Diagnosis present

## 2019-04-09 DIAGNOSIS — O99214 Obesity complicating childbirth: Secondary | ICD-10-CM | POA: Diagnosis present

## 2019-04-09 DIAGNOSIS — J45909 Unspecified asthma, uncomplicated: Secondary | ICD-10-CM | POA: Diagnosis present

## 2019-04-09 DIAGNOSIS — Z880 Allergy status to penicillin: Secondary | ICD-10-CM | POA: Diagnosis not present

## 2019-04-09 DIAGNOSIS — O1414 Severe pre-eclampsia complicating childbirth: Secondary | ICD-10-CM | POA: Diagnosis present

## 2019-04-09 DIAGNOSIS — O9952 Diseases of the respiratory system complicating childbirth: Secondary | ICD-10-CM | POA: Diagnosis present

## 2019-04-09 DIAGNOSIS — O1410 Severe pre-eclampsia, unspecified trimester: Secondary | ICD-10-CM | POA: Diagnosis present

## 2019-04-09 LAB — ABO/RH: ABO/RH(D): B POS

## 2019-04-09 LAB — COMPREHENSIVE METABOLIC PANEL
ALT: 25 U/L (ref 0–44)
AST: 37 U/L (ref 15–41)
Albumin: 2.4 g/dL — ABNORMAL LOW (ref 3.5–5.0)
Alkaline Phosphatase: 323 U/L — ABNORMAL HIGH (ref 38–126)
Anion gap: 11 (ref 5–15)
BUN: <5 mg/dL — ABNORMAL LOW (ref 6–20)
CO2: 21 mmol/L — ABNORMAL LOW (ref 22–32)
Calcium: 8.3 mg/dL — ABNORMAL LOW (ref 8.9–10.3)
Chloride: 105 mmol/L (ref 98–111)
Creatinine, Ser: 0.63 mg/dL (ref 0.44–1.00)
GFR calc Af Amer: >60 mL/min (ref >60)
GFR calc non Af Amer: >60 mL/min (ref >60)
Glucose, Bld: 93 mg/dL (ref 70–99)
Potassium: 3.8 mmol/L (ref 3.5–5.1)
Sodium: 137 mmol/L (ref 135–145)
Total Bilirubin: 0.3 mg/dL (ref 0.3–1.2)
Total Protein: 5.8 g/dL — ABNORMAL LOW (ref 6.5–8.1)

## 2019-04-09 LAB — CBC
HCT: 33.6 % — ABNORMAL LOW (ref 36.0–46.0)
Hemoglobin: 11.3 g/dL — ABNORMAL LOW (ref 12.0–15.0)
MCH: 29.8 pg (ref 26.0–34.0)
MCHC: 33.6 g/dL (ref 30.0–36.0)
MCV: 88.7 fL (ref 80.0–100.0)
Platelets: 184 10*3/uL (ref 150–400)
RBC: 3.79 MIL/uL — ABNORMAL LOW (ref 3.87–5.11)
RDW: 13.7 % (ref 11.5–15.5)
WBC: 5.5 10*3/uL (ref 4.0–10.5)
nRBC: 0.4 % — ABNORMAL HIGH (ref 0.0–0.2)

## 2019-04-09 LAB — PROTEIN / CREATININE RATIO, URINE
Creatinine, Urine: 140.74 mg/dL
Creatinine, Urine: 100.16 mg/dL
Protein Creatinine Ratio: 1.17 mg/mg{Cre} — ABNORMAL HIGH (ref 0.00–0.15)
Protein Creatinine Ratio: 0.97 mg/mg{Cre} — ABNORMAL HIGH (ref 0.00–0.15)
Total Protein, Urine: 164 mg/dL
Total Protein, Urine: 97 mg/dL

## 2019-04-09 LAB — GLUCOSE, CAPILLARY
Glucose-Capillary: 90 mg/dL (ref 70–99)
Glucose-Capillary: 96 mg/dL (ref 70–99)
Glucose-Capillary: 91 mg/dL (ref 70–99)
Glucose-Capillary: 72 mg/dL (ref 70–99)
Glucose-Capillary: 81 mg/dL (ref 70–99)
Glucose-Capillary: 87 mg/dL (ref 70–99)

## 2019-04-09 LAB — MAGNESIUM: Magnesium: 4.2 mg/dL — ABNORMAL HIGH (ref 1.7–2.4)

## 2019-04-09 LAB — TYPE AND SCREEN
ABO/RH(D): B POS
Antibody Screen: NEGATIVE

## 2019-04-09 LAB — RPR: RPR Ser Ql: NONREACTIVE

## 2019-04-09 MED ORDER — OXYTOCIN 40 UNITS IN NORMAL SALINE INFUSION - SIMPLE MED: 2.5000 [IU]/h | INTRAVENOUS | Status: DC

## 2019-04-09 MED ORDER — MAGNESIUM SULFATE BOLUS VIA INFUSION
4.0000 g | Freq: Once | INTRAVENOUS | Status: AC
  Administered 2019-04-09: 4 g via INTRAVENOUS
  Filled 2019-04-09: qty 500

## 2019-04-09 MED ORDER — MISOPROSTOL 50MCG HALF TABLET
50.0000 ug | ORAL_TABLET | ORAL | Status: DC
  Administered 2019-04-09 (×2): 50 ug via BUCCAL
  Filled 2019-04-09 (×2): qty 1

## 2019-04-09 MED ORDER — DEXAMETHASONE SODIUM PHOSPHATE 10 MG/ML IJ SOLN
10.0000 mg | Freq: Once | INTRAMUSCULAR | Status: AC
  Administered 2019-04-09: 10 mg via INTRAVENOUS
  Filled 2019-04-09: qty 1

## 2019-04-09 MED ORDER — IOHEXOL 350 MG/ML SOLN
100.0000 mL | Freq: Once | INTRAVENOUS | Status: AC | PRN
  Administered 2019-04-09: 100 mL via INTRAVENOUS

## 2019-04-09 MED ORDER — FENTANYL CITRATE (PF) 100 MCG/2ML IJ SOLN
100.0000 ug | INTRAMUSCULAR | Status: DC | PRN
  Administered 2019-04-09 – 2019-04-10 (×5): 100 ug via INTRAVENOUS
  Filled 2019-04-09 (×5): qty 2

## 2019-04-09 MED ORDER — LEVOTHYROXINE SODIUM 25 MCG PO TABS
150.0000 ug | ORAL_TABLET | Freq: Every day | ORAL | Status: DC
  Administered 2019-04-09 – 2019-04-10 (×2): 150 ug via ORAL
  Filled 2019-04-09 (×4): qty 1

## 2019-04-09 MED ORDER — LACTATED RINGERS IV SOLN
INTRAVENOUS | Status: DC
  Administered 2019-04-09 – 2019-04-10 (×7): via INTRAVENOUS

## 2019-04-09 MED ORDER — OXYCODONE HCL 5 MG PO TABS
10.0000 mg | ORAL_TABLET | Freq: Once | ORAL | Status: AC
  Administered 2019-04-09: 11:00:00 10 mg via ORAL
  Filled 2019-04-09: qty 2

## 2019-04-09 MED ORDER — FLEET ENEMA 7-19 GM/118ML RE ENEM: 1.0000 | ENEMA | RECTAL | Status: DC | PRN

## 2019-04-09 MED ORDER — ONDANSETRON HCL 4 MG/2ML IJ SOLN
4.0000 mg | Freq: Four times a day (QID) | INTRAMUSCULAR | Status: DC | PRN
  Administered 2019-04-09: 4 mg via INTRAVENOUS
  Filled 2019-04-09: qty 2

## 2019-04-09 MED ORDER — ACETAMINOPHEN 500 MG PO TABS
1000.0000 mg | ORAL_TABLET | Freq: Once | ORAL | Status: AC
  Administered 2019-04-09: 10:00:00 1000 mg via ORAL
  Filled 2019-04-09: qty 2

## 2019-04-09 MED ORDER — TERBUTALINE SULFATE 1 MG/ML IJ SOLN: 0.2500 mg | Freq: Once | INTRAMUSCULAR | Status: DC | PRN

## 2019-04-09 MED ORDER — DIPHENHYDRAMINE HCL 50 MG/ML IJ SOLN
25.0000 mg | Freq: Once | INTRAMUSCULAR | Status: AC
  Administered 2019-04-09: 25 mg via INTRAVENOUS
  Filled 2019-04-09: qty 1

## 2019-04-09 MED ORDER — VANCOMYCIN HCL IN DEXTROSE 1-5 GM/200ML-% IV SOLN
1000.0000 mg | Freq: Two times a day (BID) | INTRAVENOUS | Status: DC
  Administered 2019-04-09: 1000 mg via INTRAVENOUS
  Filled 2019-04-09: qty 200

## 2019-04-09 MED ORDER — MAGNESIUM SULFATE 40 G IN LACTATED RINGERS - SIMPLE
2.5000 g/h | INTRAVENOUS | Status: DC
  Administered 2019-04-09: 2 g/h via INTRAVENOUS
  Administered 2019-04-09 – 2019-04-10 (×2): 2.5 g/h via INTRAVENOUS
  Filled 2019-04-09 (×3): qty 500

## 2019-04-09 MED ORDER — OXYTOCIN 40 UNITS IN NORMAL SALINE INFUSION - SIMPLE MED
1.0000 m[IU]/min | INTRAVENOUS | Status: DC
  Administered 2019-04-09: 2 m[IU]/min via INTRAVENOUS
  Filled 2019-04-09: qty 1000

## 2019-04-09 MED ORDER — MISOPROSTOL 25 MCG QUARTER TABLET
25.0000 ug | ORAL_TABLET | ORAL | Status: DC | PRN
  Administered 2019-04-09 (×2): 25 ug via VAGINAL
  Filled 2019-04-09 (×3): qty 1

## 2019-04-09 MED ORDER — SOD CITRATE-CITRIC ACID 500-334 MG/5ML PO SOLN
30.0000 mL | ORAL | Status: DC | PRN
  Administered 2019-04-10: 30 mL via ORAL
  Filled 2019-04-09: qty 30

## 2019-04-09 MED ORDER — LABETALOL HCL 5 MG/ML IV SOLN: 20.0000 mg | INTRAVENOUS | Status: DC | PRN

## 2019-04-09 MED ORDER — METOCLOPRAMIDE HCL 5 MG/ML IJ SOLN
5.0000 mg | Freq: Once | INTRAMUSCULAR | Status: AC
  Administered 2019-04-09: 5 mg via INTRAVENOUS
  Filled 2019-04-09: qty 2

## 2019-04-09 MED ORDER — LACTATED RINGERS IV SOLN: 500.0000 mL | INTRAVENOUS | Status: DC | PRN

## 2019-04-09 MED ORDER — OXYTOCIN BOLUS FROM INFUSION: 500.0000 mL | Freq: Once | INTRAVENOUS | Status: DC

## 2019-04-09 MED ORDER — HYDRALAZINE HCL 20 MG/ML IJ SOLN: 10.0000 mg | INTRAMUSCULAR | Status: DC | PRN

## 2019-04-09 MED ORDER — TERBUTALINE SULFATE 1 MG/ML IJ SOLN
0.2500 mg | Freq: Once | INTRAMUSCULAR | Status: DC | PRN
  Filled 2019-04-09: qty 1

## 2019-04-09 MED ORDER — LABETALOL HCL 5 MG/ML IV SOLN: 80.0000 mg | INTRAVENOUS | Status: DC | PRN

## 2019-04-09 MED ORDER — LEVOTHYROXINE SODIUM 150 MCG PO TABS: 150.0000 ug | ORAL_TABLET | Freq: Every day | ORAL | Status: DC

## 2019-04-09 MED ORDER — LIDOCAINE HCL (PF) 1 % IJ SOLN: 30.0000 mL | INTRAMUSCULAR | Status: DC | PRN

## 2019-04-09 MED ORDER — CEFAZOLIN SODIUM-DEXTROSE 2-4 GM/100ML-% IV SOLN
2.0000 g | Freq: Three times a day (TID) | INTRAVENOUS | Status: DC
  Administered 2019-04-09 – 2019-04-10 (×5): 2 g via INTRAVENOUS
  Filled 2019-04-09 (×5): qty 100

## 2019-04-09 MED ORDER — LABETALOL HCL 5 MG/ML IV SOLN: 40.0000 mg | INTRAVENOUS | Status: DC | PRN

## 2019-04-09 NOTE — Progress Notes (Signed)
Patient ID: Nichole Bush, female   DOB: Dec 18, 1995, 23 y.o.   MRN: [id number]  Discussed rxn to PCN with patient - hives. No difficulty breathing. Will change antibiotics to ancef.  Levie Heritage, DO 04/09/2019 7:59 AM

## 2019-04-09 NOTE — Progress Notes (Signed)
[facility]N [facility]: Labor Progress Note  Subjective: Doing well, was able to get some sleep. Has a little bit of a headache again, feels like she is hungry. No blurry vision. Denies shortness of breath.   Objective: BP (!) 147/80   Pulse 91   Temp 98.7 F (37.1 C) (Oral)   Resp 16   Ht 5\' 2"  (1.575 m)   Wt 108.4 kg   LMP 04/05/2018   SpO2 99%   BMI 43.71 kg/m  Gen: well-appearing, NAD Dilation: Fingertip Effacement (%): Thick Cervical Position: Posterior Station: Ballotable Presentation: Vertex Exam by:: Cyprus McHenry RN  Assessment and Plan:Nichole Bush is a 23 y.o. G1P0 at [redacted]w[redacted]d here for induction of labor for severe preeclampsia.  Labor: Remains FT, will place 2nd cytotec and hope to place FB with next check.  -- pain control: open to options  Fetal Wellbeing: EFW 53% at 33w3 by Leopold's. Cephalic by BSUS.  -- GBS (unknown) - PCN -- continuous fetal monitoring - category I   Severe Preeclampsia: SF by headache. UPC 1.17 on admission, labs wnl. Headache intermittent still. Normal reflexes, no signs of Mg++ toxicity. BP normal to moderate range.  -- Mg++ -- labetalol protocol prn -- continue to monitor closely  A2GDM: Recently started on metformin for elevated FBG.  -- check CBG every 4 hours  Nichole Bless S. Earlene Plater, DO OB/GYN Fellow, Faculty Practice  6:46 AM

## 2019-04-09 NOTE — Progress Notes (Signed)
Patient ID: Nichole Bush, female   DOB: [birth year], 23 y.o.   MRN: [id number] States headache is relieved by increase in Magnesium Sulfate rate  Vitals:   04/09/19 1830 04/09/19 1900 04/09/19 1920 04/09/19 1930  BP: 138/90 (!) 142/81  (!) 140/94  Pulse: 88 97  92  Resp: 16 16  16   Temp:   97.9 F (36.6 C)   TempSrc:   Oral   SpO2:      Weight:      Height:       Fetal heart rate reassuring UCs irregular  Dilation: 4.5 Effacement (%): 70 Cervical Position: Posterior Station: -3 Presentation: Vertex Exam by:: Jonelle Sidle, RN   Continue plan of care Anticipate SVD

## 2019-04-09 NOTE — Progress Notes (Signed)
Patient ID: Nichole Bush, female   DOB: [birth year], 23 y.o.   MRN: [id number] TC from RN with lab results. Orders given to increase Magnesium form 2 gm/hr to 2.5 gm/hr. Will proceed with Head CT after consultation with Dr. Alvester Morin.  Raelyn Mora, CNM  04/09/2019 1:45 PM

## 2019-04-09 NOTE — Progress Notes (Signed)
Subjective: TC from RN stating that FB was now out of cervix and RN was not able to reach cervix. Request for CNM to come check cervix.  Objective: BP 138/90   Pulse 88   Temp 97.8 F (36.6 C) (Oral)   Resp 16   Ht 5\' 2"  (1.575 m)   Wt 108.4 kg   LMP 04/05/2018   SpO2 99%   BMI 43.71 kg/m  I/O last 3 completed shifts: In: 1295.2 [P.O.:520; I.V.:575.1; IV Piggyback:200.1] Out: 1000 [Urine:1000] Total I/O In: 2805.8 [P.O.:1210; I.V.:1395.8; IV Piggyback:200] Out: 1450 [Urine:1450]  FHT:  FHR: 125 bpm, variability: minimal to moderate,  accelerations:  Present,  decelerations:  Absent UC:   irregular, every 1-4 minutes SVE:   Dilation: 4.5 Effacement (%): 70 Station: -3, Ballotable Exam by:: [place] CNM  Labs: Lab Results  Component Value Date   WBC 5.5 04/09/2019   HGB 11.3 (L) 04/09/2019   HCT 33.6 (L) 04/09/2019   MCV 88.7 04/09/2019   PLT 184 04/09/2019    Assessment / Plan: Induction of labor due to preeclampsia A2GDM Hypothyroidism affecting Pregnancy   Labor: Progressing normally Preeclampsia:  on magnesium sulfate, no signs or symptoms of toxicity and labs stable Fetal Wellbeing:  Category I Pain Control:  Labor support without medications I/D:  n/a Anticipated MOD:  NSVD Discussed with patient the negative head CT - no signs of intracranial abnormality   Nichole Mora, MSN, CNM 04/09/2019, 6:37 PM

## 2019-04-09 NOTE — H&P (Signed)
OBSTETRIC ADMISSION HISTORY AND PHYSICAL  Nichole Bush is a 23 y.o. female G1P0 with IUP at [redacted]w[redacted]d by L/12 presenting for induction of labor for severe preeclampsia. Seen in MAU for elevated blood pressures at home, headaches, vision changes. Has not been able to improve headache at home.  Reports fetal movement. Denies vaginal bleeding, leakage of fluids.   She received her prenatal care at [facility].  Support person in labor: father of baby  Ultrasounds . 12w1: viability U/S . 21w2: normal fetal anatomy U/S, anterior placenta  . 25w3: EFW 57%, normal interval growth . 29w3: EFW 60%, normal interval growth . 33w3: EFW 53%, normal interval growth  Prenatal History/Complications: . A2GDM - metformin  nightly . Hypothyroidism - Synthroid  . Vitamin D deficiency . Oligohydramnios - resolved . HSP . History of Migraines   Past Medical History: Past Medical History:  Diagnosis Date  . Constipation   . Gestational diabetes   . 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 Hypertension   . Migraine   . Thyroid disease     Past Surgical History: Past Surgical History:  Procedure Laterality Date  . ESOPHAGOGASTRODUODENOSCOPY    . WISDOM TOOTH EXTRACTION      Obstetrical History: OB History    Gravida  1   Para      Term      Preterm      AB      Living  0     SAB      TAB      Ectopic      Multiple      Live Births              Social History: Social History   Socioeconomic History  . Marital status: Single    Spouse name: Not on file  . Number of children: Not on file  . Years of education: HS  . Highest education level: Not on file  Occupational History  . Occupation: [employer]  Social Needs  . Financial resource strain: Not on file  . Food insecurity:    Worry: Not on file    Inability: Not on file  . Transportation needs:    Medical: Not on file    Non-medical: Not on file  Tobacco Use  . Smoking status: Never Smoker  .  Smokeless tobacco: Never Used  Substance and Sexual Activity  . Alcohol use: No    Alcohol/week: 0.0 standard drinks  . Drug use: No  . Sexual activity: Yes    Birth control/protection: None  Lifestyle  . Physical activity:    Days per week: Not on file    Minutes per session: Not on file  . Stress: Not on file  Relationships  . Social connections:    Talks on phone: Not on file    Gets together: Not on file    Attends religious service: Not on file    Active member of club or organization: Not on file    Attends meetings of clubs or organizations: Not on file    Relationship status: Not on file  Other Topics Concern  . Not on file  Social History Narrative   Lives at home w/ her mom   Left-handed   Caffeine: occasional tea, has stopped drinking sodas x2 months (10/30/2016)    Family History: Family History  Problem Relation Age of Onset  . Migraines Father   . Hypertension Father     Allergies: Allergies  Allergen Reactions  .  Peanut-Containing Drug Products Anaphylaxis  . Penicillins Anaphylaxis, Hives and Other (See Comments)    Has patient had a PCN reaction causing immediate rash, facial/tongue/throat swelling, SOB or lightheadedness with hypotension: Yes Has patient had a PCN reaction causing severe rash involving mucus membranes or skin necrosis: No Has patient had a PCN reaction that required hospitalization No Has patient had a PCN reaction occurring within the last 10 years: No If all of the above answers are "NO", then may proceed with Cephalosporin use.   Marland Kitchen Peanut Oil Hives    Medications Prior to Admission  Medication Sig Dispense Refill Last Dose  . Butenafine HCl (LOTRIMIN ULTRA) 1 % cream Apply 1 Dose topically 2 (two) times daily. 60 g 1 Past Month at Unknown time  . cyclobenzaprine (FLEXERIL) 10 MG tablet Take 1 tablet (10 mg total) by mouth every 8 (eight) hours as needed (headaches). 30 tablet 1 Past Month at Unknown time  . hydrocortisone  (ANUSOL-HC) 25 MG suppository Place 1 suppository (25 mg total) rectally 2 (two) times daily. 12 suppository 1 04/08/2019 at Unknown time  . levothyroxine (SYNTHROID, LEVOTHROID) 125 MCG tablet TAKE 1 TABLET(125 MCG) BY MOUTH DAILY BEFORE BREAKFAST 90 tablet 1 04/08/2019 at Unknown time  . metFORMIN (GLUCOPHAGE) 500 MG tablet Take 1 tablet (500 mg total) by mouth at bedtime. 30 tablet 2 04/07/2019 at Unknown time  . albuterol (PROVENTIL HFA;VENTOLIN HFA) 108 (90 Base) MCG/ACT inhaler Inhale into the lungs.   More than a month at Unknown time  . docusate sodium (COLACE) 100 MG capsule Colace 100 mg capsule  Take 1 capsule every day by oral route.   More than a month at Unknown time  . hydrOXYzine (VISTARIL) 25 MG capsule Take 1 capsule (25 mg total) by mouth 3 (three) times daily as needed for anxiety. 30 capsule 0 More than a month at Unknown time  . linaclotide (LINZESS) 290 MCG CAPS capsule Take 290 mcg by mouth daily before breakfast.   More than a month at Unknown time  . Prenatal MV-Min-Fe Fum-FA-DHA (PRENATAL 1 PO) Take by mouth.   More than a month at Unknown time  . SUMAtriptan (IMITREX) 25 MG tablet    More than a month at Unknown time     Review of Systems  All systems reviewed and negative except as stated in HPI  Blood pressure (!) 145/89, pulse 71, temperature 98.7 F (37.1 C), temperature source Oral, resp. rate 18, height 5\' 2"  (1.575 m), weight 108.4 kg, last menstrual period 04/05/2018, SpO2 99 %. General appearance: well-appearing, NAD Lungs: no respiratory distress Heart: regular rate  Abdomen: soft, non-tender; gravid  Pelvic: deferred Extremities: no significant LE edema Presentation: cephalic by sutures on RN check and BSUS in MAU Fetal monitoring: 120s/mod/+a/-d Uterine activity: irritability Dilation: Fingertip Effacement (%): Thick Station: [employer] Exam by:: Cyprus McHenry RN  Prenatal labs: ABO, Rh: --/--/B POS, B POS Performed at [facility],  [address] [place], [place] [address]  [phone]) Antibody: NEG (05/06 0128) Rubella:   RPR: Non Reactive (03/04 0836)  HBsAg: Negative (03/24 0935)  HIV: Non Reactive (03/04 0836)  GBS:    Glucola: abnormal Genetic screening:  Reported as normal  Prenatal Transfer Tool  Maternal Diabetes: Yes:  Diabetes Type:  Insulin/Medication controlled Genetic Screening: Normal Maternal Ultrasounds/Referrals: Normal Fetal Ultrasounds or other Referrals:  None Maternal Substance Abuse:  No Significant Maternal Medications: metformin  synthroid  PNV  flexeril  hydroxyzine Significant Maternal Lab Results: None  Results for orders placed or performed during the hospital encounter of 04/08/19 (from the past 24 hour(s))  CBC   Collection Time: 04/08/19 10:09 PM  Result Value Ref Range   WBC 8.1 4.0 - 10.5 K/uL   RBC 3.70 (L) 3.87 - 5.11 MIL/uL   Hemoglobin 11.1 (L) 12.0 - 15.0 g/dL   HCT 16.132.8 (L) 09.636.0 - 04.546.0 %   MCV 88.6 80.0 - 100.0 fL   MCH 30.0 26.0 - 34.0 pg   MCHC 33.8 30.0 - 36.0 g/dL   RDW 40.913.6 81.111.5 - 91.415.5 %   Platelets 187 150 - 400 K/uL   nRBC 0.4 (H) 0.0 - 0.2 %  Comprehensive metabolic panel   Collection Time: 04/08/19 10:09 PM  Result Value Ref Range   Sodium 137 135 - 145 mmol/L   Potassium 3.9 3.5 - 5.1 mmol/L   Chloride 106 98 - 111 mmol/L   CO2 28 22 - 32 mmol/L   Glucose, Bld 74 70 - 99 mg/dL   BUN 5 (L) 6 - 20 mg/dL   Creatinine, Ser 7.820.56 0.44 - 1.00 mg/dL   Calcium 9.4 8.9 - 95.610.3 mg/dL   Total Protein 6.0 (L) 6.5 - 8.1 g/dL   Albumin 2.4 (L) 3.5 - 5.0 g/dL   AST 33 15 - 41 U/L   ALT 23 0 - 44 U/L   Alkaline Phosphatase 316 (H) 38 - 126 U/L   Total Bilirubin 0.3 0.3 - 1.2 mg/dL   GFR calc non Af Amer >60 >60 mL/min   GFR calc Af Amer >60 >60 mL/min   Anion gap 3 (L) 5 - 15  Protein / creatinine ratio, urine   Collection Time: 04/08/19 10:18 PM  Result Value Ref Range   Creatinine, Urine 140.74 mg/dL   Total Protein, Urine 164 mg/dL   Protein  Creatinine Ratio 1.17 (H) 0.00 - 0.15 mg/mg[Cre]  Urinalysis, Routine w reflex microscopic   Collection Time: 04/08/19 11:25 PM  Result Value Ref Range   Color, Urine YELLOW YELLOW   APPearance HAZY (A) CLEAR   Specific Gravity, Urine 1.021 1.005 - 1.030   pH 7.0 5.0 - 8.0   Glucose, UA NEGATIVE NEGATIVE mg/dL   Hgb urine dipstick SMALL (A) NEGATIVE   Bilirubin Urine NEGATIVE NEGATIVE   Ketones, ur NEGATIVE NEGATIVE mg/dL   Protein, ur 213100 (A) NEGATIVE mg/dL   Nitrite NEGATIVE NEGATIVE   Leukocytes,Ua NEGATIVE NEGATIVE   RBC / HPF 0-5 0 - 5 RBC/hpf   WBC, UA 0-5 0 - 5 WBC/hpf   Bacteria, UA RARE (A) NONE SEEN   Squamous Epithelial / LPF 0-5 0 - 5   Mucus PRESENT    Hyaline Casts, UA PRESENT   Type and screen [facility]   Collection Time: 04/09/19  1:28 AM  Result Value Ref Range   ABO/RH(D) B POS    Antibody Screen NEG    Sample Expiration      04/12/2019,2359 Performed at [facility], [address] [place], [place] [address]   ABO/Rh   Collection Time: 04/09/19  1:28 AM  Result Value Ref Range   ABO/RH(D)      B POS Performed at [facility], [address] [place], [place] [address]   Glucose, capillary   Collection Time: 04/09/19  2:31 AM  Result Value Ref Range   Glucose-Capillary 90 70 - 99 mg/dL    Patient Active Problem List   Diagnosis Date Noted  . Severe preeclampsia  04/09/2019  . Oligohydramnios 04/01/2019  . Hypothyroidism affecting pregnancy 02/25/2019  . Gestational diabetes mellitus (GDM) affecting pregnancy, antepartum 02/06/2019  . Supervision of high risk pregnancy, antepartum 12/27/2018  . Abnormal liver function 12/27/2018  . Vitamin D deficiency 12/27/2018  . Henoch-Schonlein purpura (HCC) 02/26/2018  . Migraine 01/24/2017  . Gastroesophageal reflux disease with esophagitis 09/18/2016  . Asthma 04/18/2016  . Subclinical hypothyroidism 06/25/2015    Assessment/Plan:  Nichole Bush is a 23 y.o. G1P0 at  [redacted]w[redacted]d here for induction of labor for severe preeclampsia.  Labor: IOL to start with cytotec then hopefully will be able to place FB.  -- pain control: open to options  Fetal Wellbeing: EFW 53% at 33w3 by Leopold's. Cephalic by sutures on RN check, vertex also confirmed by CNM in MAU prior to admission.  -- GBS (unknown) - PCN -- continuous fetal monitoring - category I   Severe Preeclampsia: SF by headache. UPC 1.21 on admission, labs wnl. Headache not improved with Tylenol but states now starting to feel better. Moderate range BP.  -- Mg++ -- labetalol protocol prn -- continue to monitor closely  A2GDM: Recently started on metformin for elevated FBG.  -- check CBG every 4 hours  Postpartum Planning -- both/?? -- RI/[/]Tdap   Nichole Calder S. Earlene Plater, DO OB/GYN Fellow

## 2019-04-09 NOTE — Progress Notes (Signed)
Patient ID: Nichole Bush, female   DOB: 21-Dec-1995, 23 y.o.   MRN: [id number]   Labor Progress Note Nichole Bush is a 23 y.o. G1P0 at [redacted]w[redacted]d here for IOL for severe preeclampsia defined by elevated BP and HA w/scotomata.   S: Patient has HA on presentation that resolved but then returned this morning. We attempted medication therapy (tylenol and oxycodone 10mg ) which decreased by did not stop her HA. Reports consistent 6/10 HA but scotomata not currently present. Denies numbness, tingling, loss of feeling, loss of function. No slurred speech.   O:  BP 127/83   Pulse 79   Temp 97.8 F (36.6 C) (Oral)   Resp 16   Ht 5\' 2"  (1.575 m)   Wt 108.4 kg   LMP 04/05/2018   SpO2 99%   BMI 43.71 kg/m  EFM: 115/mod/+accel, no decels  CVE: Dilation: 1 Effacement (%): Thick Cervical Position: Posterior Station: Ballotable Presentation: Vertex Exam by:: Carloyn Jaeger, CNM FB placed this AM  A&P: 23 y.o. G1P0 [redacted]w[redacted]d  here for IOL for severe preeclampsia defined by elevated BP and HA w/scotomata.   #Severe Preeclampsia: concerning that patient had continued scotomata. BP is wnl and currently on magnesium. No focal findings on ROS or exam.  Magnesium level was low at 4 and we increased magngesium. Will get CT to r/o PRES. Reviewed with patient reason for imaging and to report any symptoms immediately.   #Labor: Progressing normally through induction. FB currently in place and getting oral cytotec 50mg  for cervical ripening.   #Pain: IV medications. Planning for epidural #FWB: Cat I #GBS not done- preterm and on anceph for ppx.   Federico Flake, MD 2:02 PM

## 2019-04-09 NOTE — Progress Notes (Signed)
Patient ID: Nichole Bush, female   DOB: 1996/07/03, 23 y.o.   MRN: [id number] Headache has returned Vitals:   04/09/19 2201 04/09/19 2231 04/09/19 2301 04/09/19 2310  BP: 120/73 (!) 134/93 (!) 139/95   Pulse: 90 89 91   Resp: 16     Temp:    97.9 F (36.6 C)  TempSrc:    Oral  SpO2:      Weight:      Height:       WIll try Migraine cocktail with Reglan/Benadryl/decadron  Aviva Signs, CNM

## 2019-04-09 NOTE — Progress Notes (Addendum)
Subjective: Nichole Bush is a 23 y.o. G1P0 at [redacted]w[redacted]d by 6 wk ultrasound admitted [facility] She has awakened from a nap with a H/A that she rates 8/10. She was last given Tylenol at 0030 and 1015. She also reports having "flashes of light before eyes."  Objective: BP (!) 141/86   Pulse 83   Temp 97.8 F (36.6 C) (Oral)   Resp 16   Ht 5\' 2"  (1.575 m)   Wt 108.4 kg   LMP 04/05/2018   SpO2 99%   BMI 43.71 kg/m  I/O last 3 completed shifts: In: 1295.2 [P.O.:520; I.V.:575.1; IV Piggyback:200.1] Out: 1000 [Urine:1000] Total I/O In: 569.7 [P.O.:200; I.V.:369.7] Out: 500 [Urine:500]  FHT:  FHR: 125 bpm, variability: moderate,  accelerations:  Present,  decelerations:  Absent UC:   Irregular UCs with UI noted SVE:   Dilation: 1 Effacement (%): Thick Station: Ballotable Exam by:: Carloyn Jaeger, CNM  Labs: Lab Results  Component Value Date   WBC 8.1 04/08/2019   HGB 11.1 (L) 04/08/2019   HCT 32.8 (L) 04/08/2019   MCV 88.6 04/08/2019   PLT 187 04/08/2019    Assessment / Plan: Induction of labor due to preeclampsia A2GDM Hypothyroidism Affecting Pregnancy  Labor: not in labor at this time Preeclampsia:  on magnesium sulfate and recheck PEC labs and magnesium level Fetal Wellbeing:  Category I Pain Control:  none needed at this time I/D:  n/a Anticipated MOD:  NSVD  *Consult with Dr. Alvester Morin @ 1115 - notified of patient's complaints, assessments, & lab results, Recommended tx plan as below:  Repeat PEC and magnesium level labs Will consider increasing magnesium dose dependent on lab results Switch to Cytotec 50 mcg buccally with the dose that is due Oxycodone IR 10 mg po now Re-evaluate H/A 2 hrs after receiving Oxycodone IR dose Consider Head CT, if no improvement in H/A after medication  Raelyn Mora, MSN, CNM 04/09/2019, 11:08 AM

## 2019-04-10 ENCOUNTER — Encounter (HOSPITAL_COMMUNITY)
Admission: AD | Disposition: A | Discharge status: Home / Self Care | Payer: Self-pay | Source: Home / Self Care | Attending: Family Medicine

## 2019-04-10 ENCOUNTER — Inpatient Hospital Stay (HOSPITAL_COMMUNITY): Payer: Medicaid Other | Admitting: Anesthesiology

## 2019-04-10 ENCOUNTER — Encounter (HOSPITAL_COMMUNITY): Payer: Self-pay | Admitting: Certified Registered Nurse Anesthetist

## 2019-04-10 DIAGNOSIS — Z3A35 35 weeks gestation of pregnancy: Secondary | ICD-10-CM

## 2019-04-10 DIAGNOSIS — O1414 Severe pre-eclampsia complicating childbirth: Secondary | ICD-10-CM

## 2019-04-10 LAB — CBC
HCT: 34.6 % — ABNORMAL LOW (ref 36.0–46.0)
HCT: 28.9 % — ABNORMAL LOW (ref 36.0–46.0)
Hemoglobin: 11.6 g/dL — ABNORMAL LOW (ref 12.0–15.0)
Hemoglobin: 9.6 g/dL — ABNORMAL LOW (ref 12.0–15.0)
MCH: 30.1 pg (ref 26.0–34.0)
MCH: 30.3 pg (ref 26.0–34.0)
MCHC: 33.5 g/dL (ref 30.0–36.0)
MCHC: 33.2 g/dL (ref 30.0–36.0)
MCV: 89.6 fL (ref 80.0–100.0)
MCV: 91.2 fL (ref 80.0–100.0)
Platelets: 176 10*3/uL (ref 150–400)
Platelets: 157 10*3/uL (ref 150–400)
RBC: 3.86 MIL/uL — ABNORMAL LOW (ref 3.87–5.11)
RBC: 3.17 MIL/uL — ABNORMAL LOW (ref 3.87–5.11)
RDW: 13.8 % (ref 11.5–15.5)
RDW: 13.6 % (ref 11.5–15.5)
WBC: 9.1 10*3/uL (ref 4.0–10.5)
WBC: 13.3 10*3/uL — ABNORMAL HIGH (ref 4.0–10.5)
nRBC: 0 % (ref 0.0–0.2)
nRBC: 0.2 % (ref 0.0–0.2)

## 2019-04-10 LAB — GLUCOSE, CAPILLARY
Glucose-Capillary: 110 mg/dL — ABNORMAL HIGH (ref 70–99)
Glucose-Capillary: 170 mg/dL — ABNORMAL HIGH (ref 70–99)
Glucose-Capillary: 139 mg/dL — ABNORMAL HIGH (ref 70–99)
Glucose-Capillary: 104 mg/dL — ABNORMAL HIGH (ref 70–99)
Glucose-Capillary: 84 mg/dL (ref 70–99)
Glucose-Capillary: 83 mg/dL (ref 70–99)
Glucose-Capillary: 87 mg/dL (ref 70–99)
Glucose-Capillary: 94 mg/dL (ref 70–99)
Glucose-Capillary: 87 mg/dL (ref 70–99)
Glucose-Capillary: 97 mg/dL (ref 70–99)

## 2019-04-10 LAB — MAGNESIUM: Magnesium: 6.6 mg/dL (ref 1.7–2.4)

## 2019-04-10 SURGERY — Surgical Case
Anesthesia: Epidural | Site: Abdomen | Wound class: Clean Contaminated

## 2019-04-10 MED ORDER — SODIUM CHLORIDE 0.9 % IV SOLN
INTRAVENOUS | Status: DC | PRN
  Administered 2019-04-10: 20:00:00 via INTRAVENOUS

## 2019-04-10 MED ORDER — ONDANSETRON HCL 4 MG/2ML IJ SOLN
INTRAMUSCULAR | Status: DC | PRN
  Administered 2019-04-10: 4 mg via INTRAVENOUS

## 2019-04-10 MED ORDER — FENTANYL CITRATE (PF) 100 MCG/2ML IJ SOLN
INTRAMUSCULAR | Status: AC
  Filled 2019-04-10: qty 2

## 2019-04-10 MED ORDER — LIDOCAINE-EPINEPHRINE (PF) 2 %-1:200000 IJ SOLN
INTRAMUSCULAR | Status: AC
  Filled 2019-04-10: qty 20

## 2019-04-10 MED ORDER — EPHEDRINE 5 MG/ML INJ: 10.0000 mg | INTRAVENOUS | Status: DC | PRN

## 2019-04-10 MED ORDER — SCOPOLAMINE 1 MG/3DAYS TD PT72: 1.0000 | MEDICATED_PATCH | Freq: Once | TRANSDERMAL | Status: DC

## 2019-04-10 MED ORDER — ONDANSETRON HCL 4 MG/2ML IJ SOLN
INTRAMUSCULAR | Status: AC
  Filled 2019-04-10: qty 2

## 2019-04-10 MED ORDER — MORPHINE SULFATE (PF) 0.5 MG/ML IJ SOLN
INTRAMUSCULAR | Status: DC | PRN
  Administered 2019-04-10: 3 mg via EPIDURAL

## 2019-04-10 MED ORDER — MEPERIDINE HCL 25 MG/ML IJ SOLN: 6.2500 mg | INTRAMUSCULAR | Status: DC | PRN

## 2019-04-10 MED ORDER — NALOXONE HCL 0.4 MG/ML IJ SOLN: 0.4000 mg | INTRAMUSCULAR | Status: DC | PRN

## 2019-04-10 MED ORDER — ERYTHROMYCIN 5 MG/GM OP OINT
TOPICAL_OINTMENT | OPHTHALMIC | Status: AC
  Filled 2019-04-10: qty 1

## 2019-04-10 MED ORDER — DIPHENHYDRAMINE HCL 25 MG PO CAPS: 25.0000 mg | ORAL_CAPSULE | ORAL | Status: DC | PRN

## 2019-04-10 MED ORDER — FENTANYL-BUPIVACAINE-NACL 0.5-0.125-0.9 MG/250ML-% EP SOLN
12.0000 mL/h | EPIDURAL | Status: DC | PRN
  Filled 2019-04-10: qty 250

## 2019-04-10 MED ORDER — PROMETHAZINE HCL 25 MG/ML IJ SOLN: 6.2500 mg | INTRAMUSCULAR | Status: DC | PRN

## 2019-04-10 MED ORDER — PHENYLEPHRINE 40 MCG/ML (10ML) SYRINGE FOR IV PUSH (FOR BLOOD PRESSURE SUPPORT): 80.0000 ug | PREFILLED_SYRINGE | INTRAVENOUS | Status: DC | PRN

## 2019-04-10 MED ORDER — LIDOCAINE-EPINEPHRINE (PF) 2 %-1:200000 IJ SOLN
INTRAMUSCULAR | Status: DC | PRN
  Administered 2019-04-10: 5 mL via EPIDURAL
  Administered 2019-04-10: 4 mL via EPIDURAL
  Administered 2019-04-10: 5 mL via EPIDURAL

## 2019-04-10 MED ORDER — MORPHINE SULFATE (PF) 0.5 MG/ML IJ SOLN
INTRAMUSCULAR | Status: AC
  Filled 2019-04-10: qty 10

## 2019-04-10 MED ORDER — LACTATED RINGERS IV SOLN: 500.0000 mL | Freq: Once | INTRAVENOUS | Status: DC

## 2019-04-10 MED ORDER — NALBUPHINE HCL 10 MG/ML IJ SOLN: 5.0000 mg | Freq: Once | INTRAMUSCULAR | Status: DC | PRN

## 2019-04-10 MED ORDER — SODIUM CHLORIDE (PF) 0.9 % IJ SOLN
INTRAMUSCULAR | Status: DC | PRN
  Administered 2019-04-10: 12 mL/h via EPIDURAL

## 2019-04-10 MED ORDER — ONDANSETRON HCL 4 MG/2ML IJ SOLN: 4.0000 mg | Freq: Three times a day (TID) | INTRAMUSCULAR | Status: DC | PRN

## 2019-04-10 MED ORDER — NALBUPHINE HCL 10 MG/ML IJ SOLN: 5.0000 mg | INTRAMUSCULAR | Status: DC | PRN

## 2019-04-10 MED ORDER — NALOXONE HCL 4 MG/10ML IJ SOLN
1.0000 ug/kg/h | INTRAVENOUS | Status: DC | PRN
  Filled 2019-04-10: qty 5

## 2019-04-10 MED ORDER — PHENYLEPHRINE 40 MCG/ML (10ML) SYRINGE FOR IV PUSH (FOR BLOOD PRESSURE SUPPORT)
PREFILLED_SYRINGE | INTRAVENOUS | Status: DC | PRN
  Administered 2019-04-10 (×3): 80 ug via INTRAVENOUS

## 2019-04-10 MED ORDER — CEFAZOLIN SODIUM-DEXTROSE 2-4 GM/100ML-% IV SOLN: 2.0000 g | INTRAVENOUS | Status: AC

## 2019-04-10 MED ORDER — DIPHENHYDRAMINE HCL 50 MG/ML IJ SOLN: 12.5000 mg | INTRAMUSCULAR | Status: DC | PRN

## 2019-04-10 MED ORDER — LIDOCAINE HCL (PF) 1 % IJ SOLN
INTRAMUSCULAR | Status: DC | PRN
  Administered 2019-04-10: 6 mL via EPIDURAL

## 2019-04-10 MED ORDER — SODIUM CHLORIDE 0.9 % IV SOLN
INTRAVENOUS | Status: DC | PRN
  Administered 2019-04-10: 40 [IU] via INTRAVENOUS

## 2019-04-10 MED ORDER — PHENYLEPHRINE 40 MCG/ML (10ML) SYRINGE FOR IV PUSH (FOR BLOOD PRESSURE SUPPORT)
80.0000 ug | PREFILLED_SYRINGE | INTRAVENOUS | Status: DC | PRN
  Filled 2019-04-10: qty 10

## 2019-04-10 MED ORDER — MAGNESIUM SULFATE 40 G IN LACTATED RINGERS - SIMPLE
2.0000 g/h | INTRAVENOUS | Status: AC
  Administered 2019-04-11: 03:00:00 2 g/h via INTRAVENOUS

## 2019-04-10 MED ORDER — BUPIVACAINE HCL (PF) 0.25 % IJ SOLN
INTRAMUSCULAR | Status: AC
  Filled 2019-04-10: qty 30

## 2019-04-10 MED ORDER — FENTANYL CITRATE (PF) 100 MCG/2ML IJ SOLN
25.0000 ug | INTRAMUSCULAR | Status: DC | PRN
  Administered 2019-04-10: 50 ug via INTRAVENOUS

## 2019-04-10 MED ORDER — BUPIVACAINE HCL (PF) 0.25 % IJ SOLN
INTRAMUSCULAR | Status: DC | PRN
  Administered 2019-04-10: 30 mL via INTRA_ARTICULAR

## 2019-04-10 MED ORDER — SODIUM CHLORIDE 0.9 % IV SOLN
500.0000 mg | INTRAVENOUS | Status: DC
  Filled 2019-04-10: qty 500

## 2019-04-10 MED ORDER — SODIUM CHLORIDE 0.9% FLUSH: 3.0000 mL | INTRAVENOUS | Status: DC | PRN

## 2019-04-10 MED ORDER — PHENYLEPHRINE 40 MCG/ML (10ML) SYRINGE FOR IV PUSH (FOR BLOOD PRESSURE SUPPORT)
PREFILLED_SYRINGE | INTRAVENOUS | Status: AC
  Filled 2019-04-10: qty 10

## 2019-04-10 MED ORDER — CEFAZOLIN SODIUM-DEXTROSE 2-4 GM/100ML-% IV SOLN
INTRAVENOUS | Status: AC
  Filled 2019-04-10: qty 100

## 2019-04-10 SURGICAL SUPPLY — 35 items
BENZOIN TINCTURE PRP APPL 2/3 (GAUZE/BANDAGES/DRESSINGS) ×3 IMPLANT
CHLORAPREP W/TINT 26ML (MISCELLANEOUS) ×3 IMPLANT
CLAMP CORD UMBIL (MISCELLANEOUS) IMPLANT
CLOSURE WOUND 1/2 X4 (GAUZE/BANDAGES/DRESSINGS) ×1
CLOTH BEACON ORANGE TIMEOUT ST (SAFETY) ×3 IMPLANT
DRSG OPSITE POSTOP 4X10 (GAUZE/BANDAGES/DRESSINGS) ×3 IMPLANT
ELECT REM PT RETURN 9FT ADLT (ELECTROSURGICAL) ×3
ELECTRODE REM PT RTRN 9FT ADLT (ELECTROSURGICAL) ×1 IMPLANT
EXTRACTOR VACUUM M CUP 4 TUBE (SUCTIONS) IMPLANT
EXTRACTOR VACUUM M CUP 4' TUBE (SUCTIONS)
GLOVE BIOGEL PI IND STRL 7.0 (GLOVE) ×2 IMPLANT
GLOVE BIOGEL PI INDICATOR 7.0 (GLOVE) ×4
GLOVE ECLIPSE 7.0 STRL STRAW (GLOVE) ×6 IMPLANT
GOWN STRL REUS W/TWL LRG LVL3 (GOWN DISPOSABLE) ×6 IMPLANT
KIT ABG SYR 3ML LUER SLIP (SYRINGE) ×3 IMPLANT
NEEDLE HYPO 22GX1.5 SAFETY (NEEDLE) ×3 IMPLANT
NEEDLE HYPO 25X5/8 SAFETYGLIDE (NEEDLE) IMPLANT
NS IRRIG 1000ML POUR BTL (IV SOLUTION) ×3 IMPLANT
PACK C SECTION WH (CUSTOM PROCEDURE TRAY) ×3 IMPLANT
PAD ABD 7.5X8 STRL (GAUZE/BANDAGES/DRESSINGS) ×2 IMPLANT
PAD ABD 8X10 STRL (GAUZE/BANDAGES/DRESSINGS) ×3 IMPLANT
PAD OB MATERNITY 4.3X12.25 (PERSONAL CARE ITEMS) ×3 IMPLANT
PENCIL SMOKE EVAC W/HOLSTER (ELECTROSURGICAL) ×3 IMPLANT
RETRACTOR TRAXI PANNICULUS (MISCELLANEOUS) ×1 IMPLANT
RTRCTR C-SECT PINK 25CM LRG (MISCELLANEOUS) ×3 IMPLANT
SPONGE GAUZE 4X4 12PLY STER LF (GAUZE/BANDAGES/DRESSINGS) ×3 IMPLANT
STRIP CLOSURE SKIN 1/2X4 (GAUZE/BANDAGES/DRESSINGS) ×2 IMPLANT
SUT VIC AB 0 CTX 36 (SUTURE) ×6
SUT VIC AB 0 CTX36XBRD ANBCTRL (SUTURE) ×3 IMPLANT
SUT VIC AB 4-0 KS 27 (SUTURE) ×3 IMPLANT
SYR 30ML LL (SYRINGE) ×3 IMPLANT
TOWEL OR 17X24 6PK STRL BLUE (TOWEL DISPOSABLE) ×3 IMPLANT
TRAXI PANNICULUS RETRACTOR (MISCELLANEOUS) ×2
TRAY FOLEY W/BAG SLVR 14FR LF (SET/KITS/TRAYS/PACK) ×3 IMPLANT
WATER STERILE IRR 1000ML POUR (IV SOLUTION) ×3 IMPLANT

## 2019-04-10 NOTE — Transfer of Care (Signed)
Immediate Anesthesia Transfer of Care Note  Patient: Nichole Bush  Procedure(s) Performed: CESAREAN SECTION (N/A )  Patient Location: PACU  Anesthesia Type:Epidural  Level of Consciousness: awake, alert  and oriented  Airway & Oxygen Therapy: Patient Spontanous Breathing  Post-op Assessment: Report given to RN and Post -op Vital signs reviewed and stable  Post vital signs: Reviewed and stable  Last Vitals:  Vitals Value Taken Time  BP    Temp    Pulse 95 04/10/2019  8:50 PM  Resp 18 04/10/2019  8:50 PM  SpO2 99 % 04/10/2019  8:50 PM  Vitals shown include unvalidated device data.  Last Pain:  Vitals:   04/10/19 1851  TempSrc: Axillary  PainSc:       Patients Stated Pain Goal: 4 (04/09/19 1800)  Complications: No apparent anesthesia complications

## 2019-04-10 NOTE — Progress Notes (Addendum)
Nichole Bush is a 23 y.o. G1P0 at [redacted]w[redacted]d admitted for induction of labor due to Severe Preeclampsia (headache, flashes in field of vision).  Subjective: S/p epidural and sleeping, unaware of contractions. Denies questions or concerns upon CNM introduction.  Objective: BP 124/85   Pulse 87   Temp 98.4 F (36.9 C) (Oral)   Resp 18   Ht 5\' 2"  (1.575 m)   Wt 108.4 kg   LMP 04/05/2018   SpO2 99%   BMI 43.71 kg/m  I/O last 3 completed shifts: In: 8307.1 [P.O.:4230; I.V.:3677.1; IV Piggyback:400.1] Out: 5050 [Urine:5050] Total I/O In: 535.1 [P.O.:30; I.V.:305.1; IV Piggyback:200] Out: 100 [Urine:100]  FHT:  FHR: 120 bpm, variability: moderate,  accelerations:  Present,  decelerations:  Unable to categorize due to lack of contraction tracing.  SVE:   Dilation: 6 Effacement (%): 70 Station: 0 Exam by:: [place], CNM  Labs: Lab Results  Component Value Date   WBC 9.1 04/10/2019   HGB 11.6 (L) 04/10/2019   HCT 34.6 (L) 04/10/2019   MCV 89.6 04/10/2019   PLT 176 04/10/2019   Patient Vitals for the past 24 hrs:  BP Temp Temp src Pulse Resp  04/10/19 0901 124/85 - - 87 18  04/10/19 0831 (!) 111/55 - - 81 17  04/10/19 0801 (!) 98/50 - - 88 -  04/10/19 0745 127/78 - - 86 18  04/10/19 0740 127/69 - - 72 18  04/10/19 0735 125/62 - - 90 18  04/10/19 0721 131/69 - - (!) 113 -  04/10/19 0716 132/74 - - 98 -  04/10/19 0713 - - - - 18  04/10/19 0701 135/72 - - 93 15  04/10/19 0631 128/82 - - 92 15  04/10/19 0600 136/86 - - 81 13  04/10/19 0531 135/89 - - 85 12      Assessment / Plan: Category I tracing, concern for Category II Magnesium Infusing, no signs of toxicity Normotensive, denies severe features Discussed poor contraction tracing and safety of Pitocin management with patient Fetus now well-engaged, eligible for AROM. Pt amenable AROM now, small amount clear fluid, IUPC placed Epidural infusing and effective Anticipate NSVD  Calvert Cantor, CNM 04/10/2019,  9:00 AM

## 2019-04-10 NOTE — Progress Notes (Signed)
Patient has made slow change all day. Finally got to complete and pushed x 1.5 hours. Minimal descent per CNM. Patient is quite fatigued and refuses further attempt at vaginal birth. She requests C-section. FHR is 130s with recurrent variables and minimal variability, possibly related to Magnesium. On my exam she is 9.5 cm and 0 station.  Risks of surgery reviewed as well as giving her a break to rest. She again refuses further attempts at vaginal birth. Will give antibiotics and move to OR.

## 2019-04-10 NOTE — Progress Notes (Signed)
Fetal tracing and patient verbalization of exhaustion reviewed with Dr. Shawnie Pons.   Clayton Bibles, CNM 04/10/19  7:10 PM

## 2019-04-10 NOTE — Progress Notes (Signed)
Dr. Shawnie Pons notified of patient labor course, current fetal tracing and interventions.   Clayton Bibles, [place] 04/10/19  3:19 PM

## 2019-04-10 NOTE — Progress Notes (Addendum)
Nichole Bush is a 23 y.o. G1P0 at [redacted]w[redacted]d admitted for induction of labor for severe preeclampsia.  Subjective: S/p epidural, resting comfortably. Denies pain, questions, concerns. Denies severe features.  Objective: BP (!) 112/56   Pulse 88   Temp 98.5 F (36.9 C) (Oral)   Resp 16   Ht 5\' 2"  (1.575 m)   Wt 108.4 kg   LMP 04/05/2018   SpO2 99%   BMI 43.71 kg/m  I/O last 3 completed shifts: In: 8307.1 [P.O.:4230; I.V.:3677.1; IV Piggyback:400.1] Out: 5050 [Urine:5050] Total I/O In: 1757.3 [P.O.:210; I.V.:1347.3; IV Piggyback:200] Out: 975 [Urine:975]  FHT:  FHR: 125 bpm, variability: moderate,  accelerations:  Present,  decelerations:  Occasional variable, recurrent lates with good variability throughout UC:   irregular, every 1-5 minutes SVE:   Dilation: 8.5 Effacement (%): 90 Station: 0 Exam by:: stone rnc  Labs: Lab Results  Component Value Date   WBC 9.1 04/10/2019   HGB 11.6 (L) 04/10/2019   HCT 34.6 (L) 04/10/2019   MCV 89.6 04/10/2019   PLT 176 04/10/2019    Assessment / Plan: Recurrent late variables with good variability throughout. Now occurring with more frequency Not responsive to fluid bolus Pitocin off at 1445 Making cervical change, now 8.5cm with perineal pressure Reposition to high fowler's/throne as tolerated  Initiate amnioinfusion for occasional variables Anticipated MOD:  NSVD  Calvert Cantor, CNM 04/10/2019, 2:45 PM

## 2019-04-10 NOTE — Anesthesia Preprocedure Evaluation (Signed)
Anesthesia Evaluation  Patient identified by MRN, date of birth, ID band Patient awake    Reviewed: Allergy & Precautions, H&P , NPO status , Patient's Chart, lab work & pertinent test results, reviewed documented beta blocker date and time   Airway Mallampati: III  TM Distance: >3 FB Neck ROM: full    Dental no notable dental hx. (+) Teeth Intact   Pulmonary neg pulmonary ROS,    Pulmonary exam normal breath sounds clear to auscultation       Cardiovascular hypertension, Pt. on medications negative cardio ROS Normal cardiovascular exam Rhythm:regular Rate:Normal     Neuro/Psych negative neurological ROS  negative psych ROS   GI/Hepatic negative GI ROS, Neg liver ROS,   Endo/Other  diabetesMorbid obesity  Renal/GU negative Renal ROS  negative genitourinary   Musculoskeletal   Abdominal (+) + obese,   Peds  Hematology negative hematology ROS (+)   Anesthesia Other Findings   Reproductive/Obstetrics (+) Pregnancy                             Anesthesia Physical Anesthesia Plan  ASA: III  Anesthesia Plan: Epidural   Post-op Pain Management:    Induction:   PONV Risk Score and Plan: 2  Airway Management Planned:   Additional Equipment:   Intra-op Plan:   Post-operative Plan:   Informed Consent: I have reviewed the patients History and Physical, chart, labs and discussed the procedure including the risks, benefits and alternatives for the proposed anesthesia with the patient or authorized representative who has indicated his/her understanding and acceptance.     Dental Advisory Given  Plan Discussed with: CRNA, Anesthesiologist and Surgeon  Anesthesia Plan Comments: (Labs checked- platelets confirmed with RN in room. Fetal heart tracing, per RN, reported to be stable enough for sitting procedure. Discussed epidural, and patient consents to the procedure:  included risk of  possible headache,backache, failed block, allergic reaction, and nerve injury. This patient was asked if she had any questions or concerns before the procedure started.)        Anesthesia Quick Evaluation

## 2019-04-10 NOTE — Op Note (Signed)
Preoperative Diagnosis:  IUP @ [redacted]w[redacted]d, maternal exhaustion, fetal indications, pre-eclampsia with severe features  Postoperative Diagnosis:  Same  Procedure: primary low transverse cesarean section  Surgeon: Tinnie Gens, M.D.  Assistant: None  Findings: Viable boy infant, APGAR (1 MIN): 1, cord pH 7.01   vertex presentation, LOP position  Estimated blood loss: 1000 cc  Complications: None known  Specimens: Placenta to labor and delivery  Reason for procedure: Briefly, the patient is a 23 y.o. G1P0 [redacted]w[redacted]d who underwent IOL for above diagnoses. Got foley, cytotec, pitocin. Slow progress with minimal variability. Having late decels with pushing. She declined further attempts at vaginal birth.  Procedure: Patient is a to the OR where spinal analgesia was administered. She was then placed in a supine position with left lateral tilt. She received 2 g of Ancef and Azithromycin and SCDs were in place. A timeout was performed. She was prepped and draped in the usual sterile fashion. A Foley catheter was placed in the bladder. A knife was then used to make a Pfannenstiel incision. This incision was carried out to underlying fascia which was divided in the midline with the knife. The incision was extended laterally, bluntly. The rectus was divided in the midline.  The peritoneal cavity was entered bluntly.  Alexis retractor was placed inside the incision.  A knife was used to make a low transverse incision on the uterus. This incision was carried down to the amniotic cavity was entered. Fetus was in LOP position and was brought up out of the incision without difficulty. Cord was clamped x 2 and cut. Infant taken to waiting nurse.  Cord blood was obtained. Placenta was delivered from the uterus.  Uterus was cleaned with dry lap pads. Uterine incision closed with 0 Vicryl suture in a locked running fashion. A second layer of 0 Vicryl in an imbricating fashion was used to achieve hemostasis. One figure of  eight for hemostasis in midline. Alexis retractor was removed from the abdomen. Peritoneal closure was done with 0 Vicryl suture.  Fascia is closed with 0 Vicryl suture in a running fashion. Subcutaneous tissue infused with 30cc 0.25% Marcaine.  Subcutaneous closure was performed with 0 plain suture.  Skin closed using 3-0 Vicryl on a Keith needle.  Steri strips applied, followed by pressure dressing.  All instrument, needle and lap counts were correct x 2.  Patient was awake and taken to PACU stable.  Infant remained with mom in couplet care, stable.   Shelbie Proctor PrattMD 04/10/2019 8:41 PM

## 2019-04-10 NOTE — Progress Notes (Addendum)
Patient ID: Nichole Bush, female   DOB: [birth year], 23 y.o.   MRN: [id number] Now comfortable with epidural  Vitals:   04/10/19 0531 04/10/19 0600 04/10/19 0631 04/10/19 0701  BP: 135/89 136/86 128/82 135/72  Pulse: 85 81 92 93  Resp: 12 13 15 15   Temp:      TempSrc:      SpO2:      Weight:      Height:       Fetal heart rate reassuring, baseline now 110 with small accels since epidural.  Prior to that it was reactive.   UCs every 3 min  Dilation: 6 Effacement (%): 80 Cervical Position: Posterior Station: -3 Presentation: Vertex Exam by:: Jonelle Sidle, RN  Anticipate increased progress now

## 2019-04-10 NOTE — Anesthesia Procedure Notes (Signed)
Epidural Patient location during procedure: OB Start time: 04/10/2019 7:12 AM End time: 04/10/2019 7:15 AM  Staffing Anesthesiologist: Bethena Midget, MD  Preanesthetic Checklist Completed: patient identified, site marked, surgical consent, pre-op evaluation, timeout performed, IV checked, risks and benefits discussed and monitors and equipment checked  Epidural Patient position: sitting Prep: site prepped and draped and DuraPrep Patient monitoring: continuous pulse ox and blood pressure Approach: midline Location: L3-L4 Injection technique: LOR air  Needle:  Needle type: Tuohy  Needle gauge: 17 G Needle length: 9 cm and 9 Needle insertion depth: 5 cm cm Catheter type: closed end flexible Catheter size: 19 Gauge Catheter at skin depth: 10 cm Test dose: negative  Assessment Events: blood not aspirated, injection not painful, no injection resistance, negative IV test and no paresthesia

## 2019-04-10 NOTE — Progress Notes (Signed)
Nichole Bush is a 23 y.o. G1P0 at [redacted]w[redacted]d   Subjective: Dozing upon entry to room.   Objective: BP 126/78   Pulse 97   Temp (!) 97.5 F (36.4 C) (Oral)   Resp 17   Ht 5\' 2"  (1.575 m)   Wt 108.4 kg   LMP 04/05/2018   SpO2 99%   BMI 43.71 kg/m  I/O last 3 completed shifts: In: 8307.1 [P.O.:4230; I.V.:3677.1; IV Piggyback:400.1] Out: 5050 [Urine:5050] Total I/O In: 1998.6 [P.O.:210; I.V.:1488.6; IV Piggyback:300] Out: 975 [Urine:975]  FHT:  FHR: 135 bpm, variability: moderate,  accelerations:  Present,  decelerations:  Present lates UC:   irregular, every 4-5 minutes SVE:   Dilation: 9 Effacement (%): 90 Station: Plus 1, 0 Exam by:: Dontrae Morini cnm  Labs: Lab Results  Component Value Date   WBC 9.1 04/10/2019   HGB 11.6 (L) 04/10/2019   HCT 34.6 (L) 04/10/2019   MCV 89.6 04/10/2019   PLT 176 04/10/2019    Assessment / Plan: Pitocin off Internals Placed Positive scalp stim Patient now 9/90/0 to +1. Noted to be a lip posteriorly. Repositioned to high fowler's MgSO4 infusing, normotensive, no severe signs of symptoms Normal CBG Epidural infusing and effective Anticipate NSVD  Coordinated with Dr. Shawnie Pons at [address] [place], [place] 04/10/2019, 3:38 PM

## 2019-04-10 NOTE — Progress Notes (Signed)
Nichole Bush is a 23 y.o. G1P0 at [redacted]w[redacted]d admitted for IOL for preeclampsia with severe features  S: Patient tolerating High Fowler's. Mildly complaining of decreasing epidural pain coverage in her upper abdomen  O: Patient Vitals for the past 24 hrs:  BP Temp Temp src Pulse Resp  04/10/19 1601 138/83 - - 100 17  04/10/19 1537 - (!) 97.5 F (36.4 C) Oral - -  04/10/19 1531 126/78 - - 97 17  04/10/19 1500 (!) 114/46 - - 83 16  04/10/19 1432 110/61 - - (!) 199 17  04/10/19 1402 (!) 112/56 - - 88 16  04/10/19 1331 119/62 - - 84 16   CBG (last 3)  Recent Labs    04/10/19 1005 04/10/19 1159 04/10/19 1420  GLUCAP 104* 84 83    A/P: Cervix now anterior lip/100%/+1 Category I tracing.  Reassess for complete dilation in one hour if baby remains Category I. Discussed with patient that remaining cervix is very soft and mobile. If baby starts to look less reassuring it would be safer to push past soft cervix rather than waiting for complete dilation. Patient agreeable with this pain  Anticipate NSVD  Clayton Bibles, CNM 04/10/19  4:39 PM

## 2019-04-10 NOTE — Discharge Summary (Signed)
Postpartum Discharge Summary     Patient Name: Nichole SlackDiamond Bush DOB: [birth year] MRN: [id number]  Date of admission: 04/08/2019 Delivering Provider: Reva BoresPRATT, TANYA S   Date of discharge: 04/13/2019  Admitting diagnosis: High BP Intrauterine pregnancy: 7116w5d     Secondary diagnosis:  Principal Problem:   Severe preeclampsia Active Problems:   Abnormal liver function   Asthma   Vitamin D deficiency   Gestational diabetes mellitus (GDM) affecting pregnancy, antepartum   Hypothyroidism affecting pregnancy   Oligohydramnios  Additional problems: None     Discharge diagnosis: Preterm Pregnancy Delivered and Preeclampsia (severe)                                                                                                Post partum procedures:none  Augmentation: AROM, Pitocin, Cytotec and Foley Balloon  Complications: None  Hospital course:  Induction of Labor With Cesarean Section  23 y.o. yo G1P0 at 8616w5d was admitted to the hospital 04/08/2019 for induction of labor due to pre-eclampsia with severe features due to headache and visual changes. Had head CT to r/o PRES which was negative. Patient had a labor course significant for Cytotec, foley balloon, pitocin, AROM. Fetus became flat with late/variable decels, though continued to have scalp stim, then had slow progress and patient became exhausted and refused further attempts at vaginal birth. The patient went for cesarean section due to Elective Primary and fetal indications, and delivered a Viable infant,04/10/2019  Membrane Rupture Time/Date: 9:00 AM ,04/10/2019   Details of operation can be found in separate operative Note.  Patient had an postpartum course significant for Magnesium x 24 hours.for preeclampsia She is ambulating, tolerating a regular diet, passing flatus, and urinating well.  Patient is discharged home in stable condition on 05/10.                                    Magnesium Sulfate received: Yes BMZ received:  No  Physical exam  Vitals:   04/12/19 1552 04/12/19 1943 04/12/19 2339 04/13/19 0322  BP: (!) 117/91 (!) 141/87 128/82 125/80  Pulse: 84 89 81 78  Resp: 16 19 18 17   Temp: 99.8 F (37.7 C) 98.7 F (37.1 C) 98.3 F (36.8 C) 98 F (36.7 C)  TempSrc: Oral Oral Oral Oral  SpO2: 100% 100% 100% 100%  Weight:      Height:       General: alert, cooperative and no distress Lochia: appropriate Uterine Fundus: firm Incision: Healing well with no significant drainage DVT Evaluation: No evidence of DVT seen on physical exam. Labs: Lab Results  Component Value Date   WBC 13.3 (H) 04/10/2019   HGB 9.6 (L) 04/10/2019   HCT 28.9 (L) 04/10/2019   MCV 91.2 04/10/2019   PLT 157 04/10/2019   CMP Latest Ref Rng & Units 04/09/2019  Glucose 70 - 99 mg/dL 93  BUN 6 - 20 mg/dL <4(U<5(L)  Creatinine 9.810.44 - 1.00 mg/dL 1.910.63  Sodium 478135 - 295145 mmol/L 137  Potassium 3.5 - 5.1 mmol/L 3.8  Chloride 98 - 111 mmol/L 105  CO2 22 - 32 mmol/L 21(L)  Calcium 8.9 - 10.3 mg/dL 8.3(L)  Total Protein 6.5 - 8.1 g/dL 1.6(X)  Total Bilirubin 0.3 - 1.2 mg/dL 0.3  Alkaline Phos 38 - 126 U/L 323(H)  AST 15 - 41 U/L 37  ALT 0 - 44 U/L 25    Discharge instruction: per After Visit Summary and "Baby and Me Booklet".  After visit meds:  Allergies as of 04/13/2019      Reactions   Peanut-containing Drug Products Anaphylaxis   Penicillins Anaphylaxis, Hives, Other (See Comments)   Has patient had a PCN reaction causing immediate rash, facial/tongue/throat swelling, SOB or lightheadedness with hypotension: Yes Has patient had a PCN reaction causing severe rash involving mucus membranes or skin necrosis: No Has patient had a PCN reaction that required hospitalization No Has patient had a PCN reaction occurring within the last 10 years: No If all of the above answers are "NO", then may proceed with Cephalosporin use.   Peanut Oil Hives      Medication List    STOP taking these medications   metFORMIN 500 MG  tablet Commonly known as:  Glucophage     TAKE these medications   albuterol 108 (90 Base) MCG/ACT inhaler Commonly known as:  VENTOLIN HFA Inhale 1-2 puffs into the lungs every 6 (six) hours as needed for wheezing or shortness of breath.   Butenafine HCl 1 % cream Commonly known as:  Lotrimin Ultra Apply 1 Dose topically 2 (two) times daily.   cyclobenzaprine 10 MG tablet Commonly known as:  FLEXERIL Take 1 tablet (10 mg total) by mouth every 8 (eight) hours as needed (headaches).   hydrochlorothiazide 25 MG tablet Commonly known as:  HYDRODIURIL Take 1 tablet (25 mg total) by mouth daily.   hydrocortisone 25 MG suppository Commonly known as:  ANUSOL-HC Place 1 suppository (25 mg total) rectally 2 (two) times daily.   ibuprofen 600 MG tablet Commonly known as:  ADVIL Take 1 tablet (600 mg total) by mouth every 6 (six) hours as needed for mild pain.   levothyroxine 125 MCG tablet Commonly known as:  SYNTHROID TAKE 1 TABLET(125 MCG) BY MOUTH DAILY BEFORE BREAKFAST   oxyCODONE-acetaminophen 5-325 MG tablet Commonly known as:  PERCOCET/ROXICET Take 1-2 tablets by mouth every 6 (six) hours as needed.       Diet: No evidence of DVT seen on physical exam. Calf/Ankle edema is present  Activity: Advance as tolerated. Pelvic rest for 6 weeks.   Outpatient follow up:1 week Follow up Appt:No future appointments. Follow up Visit: Follow-up Information    [facility] Follow up in 1 week(s).   Specialty:  Obstetrics and Gynecology Why:  BP and incision Contact information: [address] [place] [address] [phone]           Please schedule this patient for Postpartum visit in: 4 weeks with the following provider: MD For C/S patients schedule nurse incision check in weeks 2 weeks: yes High risk pregnancy complicated by: GDM, HTN Delivery mode:  CS Anticipated Birth Control:   other/unsure PP Procedures needed: Incision check, BP check, 2 hour Schedule Integrated BH visit: no      Newborn Data: Live born female  Birth Weight:   APGAR: 1, 4  Newborn Delivery   Birth date/time:  04/10/2019 20:02:00 Delivery type:  C-Section, Low Transverse Trial of labor:  Yes C-section categorization:  Primary  Baby Feeding: Bottle and Breast Disposition:rooming in   04/13/2019 Scheryl Darter, MD

## 2019-04-10 NOTE — Progress Notes (Signed)
Called anesthesiologist Dr. Krista Blue to verify order to remove epidural. Platelet count 157, RN to remove epidural catheter.   Arva Chafe, RN

## 2019-04-11 ENCOUNTER — Encounter (HOSPITAL_COMMUNITY): Payer: Self-pay | Admitting: Family Medicine

## 2019-04-11 LAB — CULTURE, BETA STREP (GROUP B ONLY)

## 2019-04-11 MED ORDER — DIPHENHYDRAMINE HCL 25 MG PO CAPS: 25.0000 mg | ORAL_CAPSULE | Freq: Four times a day (QID) | ORAL | Status: DC | PRN

## 2019-04-11 MED ORDER — SIMETHICONE 80 MG PO CHEW
80.0000 mg | CHEWABLE_TABLET | Freq: Three times a day (TID) | ORAL | Status: DC
  Administered 2019-04-11 – 2019-04-12 (×3): 80 mg via ORAL
  Filled 2019-04-11 (×4): qty 1

## 2019-04-11 MED ORDER — OXYCODONE HCL 5 MG PO TABS
5.0000 mg | ORAL_TABLET | ORAL | Status: DC | PRN
  Administered 2019-04-11: 10 mg via ORAL
  Administered 2019-04-11: 5 mg via ORAL
  Administered 2019-04-12 (×2): 10 mg via ORAL
  Filled 2019-04-11 (×3): qty 2
  Filled 2019-04-11 (×2): qty 1

## 2019-04-11 MED ORDER — PRENATAL MULTIVITAMIN CH
1.0000 | ORAL_TABLET | Freq: Every day | ORAL | Status: DC
  Administered 2019-04-11: 1 via ORAL
  Filled 2019-04-11: qty 1

## 2019-04-11 MED ORDER — COCONUT OIL OIL: 1.0000 "application " | TOPICAL_OIL | Status: DC | PRN

## 2019-04-11 MED ORDER — LACTATED RINGERS IV SOLN
INTRAVENOUS | Status: DC
  Administered 2019-04-11: 13:00:00 via INTRAVENOUS

## 2019-04-11 MED ORDER — SENNOSIDES-DOCUSATE SODIUM 8.6-50 MG PO TABS
2.0000 | ORAL_TABLET | ORAL | Status: DC
  Administered 2019-04-11 – 2019-04-12 (×3): 2 via ORAL
  Filled 2019-04-11 (×3): qty 2

## 2019-04-11 MED ORDER — SIMETHICONE 80 MG PO CHEW
80.0000 mg | CHEWABLE_TABLET | ORAL | Status: DC
  Administered 2019-04-11 – 2019-04-12 (×2): 80 mg via ORAL
  Filled 2019-04-11: qty 1

## 2019-04-11 MED ORDER — LEVOTHYROXINE SODIUM 25 MCG PO TABS
125.0000 ug | ORAL_TABLET | Freq: Every day | ORAL | Status: DC
  Administered 2019-04-12 – 2019-04-13 (×2): 125 ug via ORAL
  Filled 2019-04-11 (×2): qty 5

## 2019-04-11 MED ORDER — ZOLPIDEM TARTRATE 5 MG PO TABS: 5.0000 mg | ORAL_TABLET | Freq: Every evening | ORAL | Status: DC | PRN

## 2019-04-11 MED ORDER — WITCH HAZEL-GLYCERIN EX PADS: 1.0000 "application " | MEDICATED_PAD | CUTANEOUS | Status: DC | PRN

## 2019-04-11 MED ORDER — DIBUCAINE (PERIANAL) 1 % EX OINT: 1.0000 "application " | TOPICAL_OINTMENT | CUTANEOUS | Status: DC | PRN

## 2019-04-11 MED ORDER — TETANUS-DIPHTH-ACELL PERTUSSIS 5-2.5-18.5 LF-MCG/0.5 IM SUSP: 0.5000 mL | Freq: Once | INTRAMUSCULAR | Status: DC

## 2019-04-11 MED ORDER — SIMETHICONE 80 MG PO CHEW
80.0000 mg | CHEWABLE_TABLET | ORAL | Status: DC | PRN
  Filled 2019-04-11: qty 1

## 2019-04-11 MED ORDER — IBUPROFEN 600 MG PO TABS
600.0000 mg | ORAL_TABLET | Freq: Four times a day (QID) | ORAL | Status: DC | PRN
  Administered 2019-04-11 – 2019-04-13 (×3): 600 mg via ORAL
  Filled 2019-04-11 (×3): qty 1

## 2019-04-11 MED ORDER — MENTHOL 3 MG MT LOZG: 1.0000 | LOZENGE | OROMUCOSAL | Status: DC | PRN

## 2019-04-11 MED ORDER — HYDROMORPHONE HCL 1 MG/ML IJ SOLN: 1.0000 mg | INTRAMUSCULAR | Status: DC | PRN

## 2019-04-11 MED ORDER — OXYTOCIN 40 UNITS IN NORMAL SALINE INFUSION - SIMPLE MED: 2.5000 [IU]/h | INTRAVENOUS | Status: DC

## 2019-04-11 NOTE — Progress Notes (Signed)
RN spoke with pt regarding ambulation. Pt states feelings of exhaustion and feelings of dizziness earlier in the morning. Will assess ability to ambulate after pt has had lunch and rest.

## 2019-04-11 NOTE — Anesthesia Postprocedure Evaluation (Signed)
Anesthesia Post Note  Patient: Nichole Bush  Procedure(s) Performed: CESAREAN SECTION (N/A Abdomen)     Patient location during evaluation: PACU Anesthesia Type: Epidural Level of consciousness: awake and alert Pain management: pain level controlled Vital Signs Assessment: post-procedure vital signs reviewed and stable Respiratory status: spontaneous breathing and respiratory function stable Cardiovascular status: blood pressure returned to baseline and stable Postop Assessment: spinal receding Anesthetic complications: no    Last Vitals:  Vitals:   04/10/19 2210 04/10/19 2311  BP: 118/67 120/71  Pulse: 94 94  Resp: 18 16  Temp: 36.7 C 36.8 C  SpO2: 97% 100%    Last Pain:  Vitals:   04/10/19 2311  TempSrc: Oral  PainSc:    Pain Goal: Patients Stated Pain Goal: 4 (04/09/19 1800)                 Suriya Kovarik DANIEL

## 2019-04-11 NOTE — Lactation Note (Signed)
This note was copied from a baby's chart. Lactation Consultation Note Baby 5 hrs old. Mom holding baby. Baby probably will be transferred to NICU per RN d/t low temps and hypoglycemia.  Mom plans to breast/formula feed. LC set pump up, mom is on the phone to family. State she will pump in am after resting. Encouraged to pump every 3 hrs to initiate lactation.  No education can be done at this time. LC mention normal not to get anything when pumping, important to pump, milk storage discussed briefly. Colostrum container left at bedside. Mention to mom someone would see her later today. Lactation brochure left at bedside.  Patient Name: Boy Anvika Norrick QGBEE'F Date: 04/11/2019 Reason for consult: Initial assessment;Late-preterm 34-36.6wks;1st time breastfeeding(baby probably going to be transfered to NICU)   Maternal Data Has patient been taught Hand Expression?: Yes Does the patient have breastfeeding experience prior to this delivery?: No  Feeding Feeding Type: Formula  LATCH Score                   Interventions Interventions: DEBP  Lactation Tools Discussed/Used Tools: Pump Breast pump type: Double-Electric Breast Pump WIC Program: Yes Pump Review: Setup, frequency, and cleaning;Milk Storage Initiated by:: RN Date initiated:: 04/11/19   Consult Status Consult Status: Follow-up Date: 04/11/19 Follow-up type: In-patient    Karandeep Resende, Lanyiah Nickel 04/11/2019, 1:54 AM

## 2019-04-11 NOTE — Lactation Note (Signed)
This note was copied from a baby's chart. Lactation Consultation Note  Patient Name: Nichole Bush NMMHW'K Date: 04/11/2019   Mom is a P1 on MgSO4. Mom has GDM & a hx of hypothyroidism. RN called me & told me Mom was getting discouraged about breastfeeding. I paid a visit to Mom & talked about reasonable expectations for a late preterm infant (infant may not show interest/may tire too easily; typically improvement is seen around the EDD). I also told Mom that it is OK not to offer the breast every feeding. Mom has the option of offering the breast every other feeding, or every few feedings, as she desires.   I did encourage Mom to express her milk & showed her a different way to hand express. We were able to get about 3 mL of colostrum, which was placed in the refrigerator.   I gave Mom the LPI sheet that showed volume parameters, but Mom understands to feed infant more if he wants more. Mom seemed in better spirits at the end of the visit.    [facility] 04/11/2019, 4:21 PM

## 2019-04-11 NOTE — Progress Notes (Signed)
Subjective:no headache, notes incisional pain Postpartum Day 1: Cesarean Delivery Patient reports incisional pain and tolerating PO.    Objective: Vital signs in last 24 hours: Temp:  [97.5 F (36.4 C)-99.3 F (37.4 C)] 98.3 F (36.8 C) (05/08 0853) Pulse Rate:  [74-199] 74 (05/08 0853) Resp:  [12-34] 16 (05/08 0900) BP: (109-142)/(46-101) 127/71 (05/08 0853) SpO2:  [94 %-100 %] 98 % (05/08 0853)  Physical Exam:  General: alert, cooperative and no distress Lochia: appropriate Uterine Fundus: firm Incision: no significant drainage DVT Evaluation: No evidence of DVT seen on physical exam.  Recent Labs    04/10/19 0634 04/10/19 2151  HGB 11.6* 9.6*  HCT 34.6* 28.9*    Assessment/Plan: Status post Cesarean section. Doing well postoperatively.  D/C magnesium at 2000.  Scheryl Darter 04/11/2019, 9:45 AM

## 2019-04-12 NOTE — Progress Notes (Signed)
Subjective: Postpartum Day 2: Cesarean Delivery Patient reports incisional pain and tolerating PO.  Has gas and has not been walking  Objective: Vital signs in last 24 hours: Temp:  [97.7 F (36.5 C)-98.5 F (36.9 C)] 98.3 F (36.8 C) (05/08 2310) Pulse Rate:  [74-87] 80 (05/08 2310) Resp:  [16-18] 18 (05/08 2310) BP: (119-130)/(71-76) 130/76 (05/08 2310) SpO2:  [98 %-100 %] 99 % (05/08 2310)  Physical Exam:  General: alert, cooperative and appears stated age Lochia: appropriate Uterine Fundus: firm Incision: no significant drainage DVT Evaluation: No evidence of DVT seen on physical exam.  Recent Labs    04/10/19 0634 04/10/19 2151  HGB 11.6* 9.6*  HCT 34.6* 28.9*    Assessment/Plan: Status post Cesarean section. Doing well postoperatively.  Continue current care. S/p Magnesium BP is well controlled right now  Reva Bores 04/12/2019, 8:22 AM

## 2019-04-13 MED ORDER — OXYCODONE-ACETAMINOPHEN 5-325 MG PO TABS: 1.0000 | ORAL_TABLET | Freq: Four times a day (QID) | ORAL | 0 refills | Status: DC | PRN

## 2019-04-13 MED ORDER — BISACODYL 10 MG RE SUPP: 10.0000 mg | Freq: Every day | RECTAL | Status: DC | PRN

## 2019-04-13 MED ORDER — HYDROCHLOROTHIAZIDE 25 MG PO TABS: 25.0000 mg | ORAL_TABLET | Freq: Every day | ORAL | 3 refills | Status: DC

## 2019-04-13 MED ORDER — IBUPROFEN 600 MG PO TABS: 600.0000 mg | ORAL_TABLET | Freq: Four times a day (QID) | ORAL | 0 refills | Status: DC | PRN

## 2019-04-13 NOTE — Lactation Note (Signed)
This note was copied from a baby's chart. Lactation Consultation Note  Patient Name: Boy Mikeshia Zotter AXENM'M Date: 04/13/2019 Reason for consult: Follow-up assessment;Infant < 6lbs;Hyperbilirubinemia;Infant weight loss;1st time breastfeeding;Maternal endocrine disorder Type of Endocrine Disorder?: Thyroid(hypothyroid)  21 hours old LPI female < 6 lbs who is being mostly formula fed by his mother, baby is on Similac 22 calorie formula, mom is supplementing every 3 hours or sooner if baby is showing hunger cues. Baby is currently on double phototherapy. Mom feeling on better spirits today, she told LC she pumped 3 times yesterday and one time today but that she's no getting anything with the DEBP unlike hand expression. Mom voiced she's again about 6 ml combined when she does hand expression and that is working out better for her at this point. Praised her for her efforts and advised her to continue working on it, and try to do it after every feeding because baby needs any amount of EBM she can get to offset the excess of bilirubin.  Mom hasn't been taking baby to the breast consistently and now that baby is on the blue blanket feedings at the breast are even more challenging for mom. Provided reassurance and let her know that any amount of EBM will still be beneficial for him, baby is taking about 40-50 ml of Similac 22 calorie formula per feeding. Revised mom's health history and let her know that her milk may take a bit longer to come in, and encouraged her to keep working on pumping and hand expression for the induction of lactation. Mom had both GDM and hypothyroid. Reviewed formula supplementation guidelines for LPI according to baby's age in hours.  Feeding plan:  1. Encouraged mom to feed baby STS every 3 hours or sooner if feeding cues are present 2. Baby will continue on Similac 22 calorie formula with a slow flow nipple 3. Mom will try to pump at least 4-5 times/day as a realistic goal 4.  She'll try to hand express at every feeding to provide baby with any amount of EBM she may get prior offering Similac 22  Parents reported all questions and concerns were answered, they're both aware of LC services and will call PRN.  Maternal Data    Feeding    Interventions Interventions: Breast feeding basics reviewed  Lactation Tools Discussed/Used     Consult Status Consult Status: PRN Follow-up type: In-patient    Jamel Holzmann Venetia Constable 04/13/2019, 12:14 PM

## 2019-04-13 NOTE — Discharge Instructions (Signed)

## 2019-04-14 ENCOUNTER — Ambulatory Visit: Payer: Self-pay

## 2019-04-14 NOTE — Lactation Note (Signed)
This note was copied from a baby's chart. Lactation Consultation Note  Patient Name: Nichole Bush AVWUJ'WToday's Date: 04/14/2019 Reason for consult: Follow-up assessment;Late-preterm 34-36.6wks;Primapara;1st time breastfeeding;Infant < 6lbs;Hyperbilirubinemia;Maternal endocrine disorder Type of Endocrine Disorder?: Diabetes  P1 mother whose infant is now 4485 hours old.  This is a LPTI at 4985 hours old weighing < 6 lbs.  Baby is on phototherapy; eye patches in place.  Mother's feeding choice on admission was breast/bottle.  She has been educated on feeding volumes and I reinforced that baby should be feeding 30+ mls at every feeding and to allow him to feed as much as he desires.  Mother has not been pumping consistently; she has not pumped since yesterday at all.  She stated, "I cannot put him down because he cries" (referring to the baby).  Educated her on the importance of putting baby to breast and/or pumping at least 8 times/24 hours.  I offered to place baby in bassinet so mother can pump and she stated, "He's gonna cry."  I asked if I could try and she agreed.  Removed baby from mother's chest and placed him in the bassinet without interruption on sleep or behavior.  Discussed with mother ways to continue to pump when alone and still keep her baby content.    Reviewed pump and settings with mother.  #24 flange size is appropriate at this time.  Mother seemed uniformed about pumping and the importance of pumping.  She does not want to place baby at the breast right now stating, "his mouth is too small."  She has been supplementing with Similac 22 and I reviewed appropriate volumes again.  Mother verbalized understanding.    While observing mother pump I discussed breastfeeding basics, supply and demand, STS, milk coming to volume, breast fullness and importance of pumping regularly.  Mother's breasts are large, soft and non tender and the left breast is full but not engorged.  The right breast is soft  and not full.  After approximately 10 minutes of pumping mother had already obtained 30 mls of milk from the left breast and a few mls from the right breast.  Assisted mother to empty small bottle from left breast.  Encouraged mother to feed her milk first prior to any formula.  Discussed the importance of adequate volumes to help lower bilirubin.  The bilirubin level is at 14.4 mg/dl as of [id number] this a.m.  Mother is waiting for the pediatrician in hopes of being discharged today.  I explained that the pediatrician may want the baby off bili lights for awhile and may obtain another bilirubin level today; this will be determined when the pediatrician makes rounds.  Mother verbalized understanding.    Mother will continue to pump every three hours while here in the hospital.  Explained how to make a "hands free" bra when she arrives at home.  [facility].  She will call for assistance as needed.  RN updated.     Maternal Data Formula Feeding for Exclusion: Yes Reason for exclusion: Mother's choice to formula and breast feed on admission Has patient been taught Hand Expression?: Yes Does the patient have breastfeeding experience prior to this delivery?: No  Feeding Feeding Type: Bottle Fed - Formula Nipple Type: Slow - flow  LATCH Score                   Interventions    Lactation Tools Discussed/Used  WIC Program: Yes Pump Review: Setup, frequency, and cleaning Initiated by:: Reviewed by Laureen Ochs   Consult Status Consult Status: Follow-up Date: 04/15/19 Follow-up type: In-patient    Kelen Laura R Jayleon Mcfarlane 04/14/2019, 9:53 AM

## 2019-04-15 ENCOUNTER — Encounter: Payer: Medicaid Other | Admitting: Advanced Practice Midwife

## 2019-04-15 ENCOUNTER — Ambulatory Visit: Payer: Self-pay

## 2019-04-15 NOTE — Lactation Note (Signed)
This note was copied from a baby's chart. Lactation Consultation Note  Patient Name: Boy Verdean Brando [id number] Date: 04/15/2019    P73, Baby 40 days old.  [redacted]w[redacted]d < 6 lbs.  Mother is pumping and bottle feeding.  She is pumping approx 140 ml per session. Mother states she is pumping q 4 hours. Suggest pumping q 3 hours during the day with a four hour break at night. Reviewed engorgement care and monitoring voids/stools. Discussed milk storage. Mother states she will be getting DEBP from [facility] later today. Encouraged her to call if she needs further assistance.    Maternal Data    Feeding Feeding Type: Bottle Fed - Formula  LATCH Score                   Interventions    Lactation Tools Discussed/Used     Consult Status      Hardie Pulley 04/15/2019, 9:58 AM

## 2019-04-21 ENCOUNTER — Telehealth: Payer: Self-pay

## 2019-04-21 NOTE — Telephone Encounter (Signed)
Pt's Care [facility] nurse called stating that the pt has not been able to check her BP but pt is seeing spots. Pt's nurse wanted to know if we can get pt in sooner to have her BP checked.Pt's appt was rescheduled to 04/23/19. Pt's nurse states that she will notify pt and have pt call the office if the appt time will not work for her.  chiquita l wilson, CMA

## 2019-04-22 ENCOUNTER — Ambulatory Visit (HOSPITAL_COMMUNITY): Payer: Medicaid Other

## 2019-04-23 ENCOUNTER — Ambulatory Visit: Payer: Medicaid Other | Admitting: Obstetrics & Gynecology

## 2019-04-23 ENCOUNTER — Other Ambulatory Visit: Payer: Self-pay

## 2019-04-23 ENCOUNTER — Ambulatory Visit (INDEPENDENT_AMBULATORY_CARE_PROVIDER_SITE_OTHER): Payer: Medicaid Other | Admitting: Obstetrics & Gynecology

## 2019-04-23 ENCOUNTER — Encounter: Payer: Self-pay | Admitting: Obstetrics & Gynecology

## 2019-04-23 DIAGNOSIS — R51 Headache: Secondary | ICD-10-CM

## 2019-04-23 DIAGNOSIS — G5600 Carpal tunnel syndrome, unspecified upper limb: Secondary | ICD-10-CM

## 2019-04-23 DIAGNOSIS — R519 Headache, unspecified: Secondary | ICD-10-CM

## 2019-04-23 DIAGNOSIS — O26899 Other specified pregnancy related conditions, unspecified trimester: Secondary | ICD-10-CM

## 2019-04-23 MED ORDER — MISC. DEVICES MISC: 0 refills | Status: DC

## 2019-04-23 NOTE — Progress Notes (Signed)
History:  23 y.o. G1P0101 here today for 2 week incision check. Pt reports HA. She was dx  with preeclampsia in the hosp and presents for BP check and incision check today.   She reports that the HAs are like her prev migraines.  She was prev seeing a neurologist but, does not know what meds she was taking.   Pt reports that her carpal tunnel is getting worse. She has trouble picking up baby at times. Has not been able to get the wrist splints.      The following portions of the patient's history were reviewed and updated as appropriate: allergies, current medications, past family history, past medical history, past social history, past surgical history and problem list.  Review of Systems:  Pertinent items are noted in HPI.    Objective:  Physical Exam Blood pressure 113/79, pulse 100, height 5\' 2"  (1.575 m), weight 205 lb (93 kg), last menstrual period 04/05/2018, unknown if currently breastfeeding.  CONSTITUTIONAL: Well-developed, well-nourished female in no acute distress.  HENT:  Normocephalic, atraumatic EYES: Conjunctivae and EOM are normal. No scleral icterus.  NECK: Normal range of motion SKIN: Skin is warm and dry. No rash noted. Not diaphoretic.No pallor. NEUROLGIC: Alert and oriented to person, place, and time. Normal coordination.  Abd: Soft, nontender and nondistended; incision: C/D/ I  Pelvic: deferred  Labs and Imaging Ct Angio Head W Or Wo Contrast  Result Date: 04/09/2019 CLINICAL DATA:  Severe preeclampsia. Headache. EXAM: CT ANGIOGRAPHY HEAD TECHNIQUE: Multidetector CT imaging of the head was performed using the standard protocol during bolus administration of intravenous contrast. Multiplanar CT image reconstructions and MIPs were obtained to evaluate the vascular anatomy. CONTRAST:  100mL OMNIPAQUE IOHEXOL 350 MG/ML SOLN COMPARISON:  Brain MRI 03/22/2017 FINDINGS: CT HEAD Brain: There is no evidence of acute infarct, intracranial hemorrhage, mass, midline shift, or  extra-axial fluid collection. The ventricles and sulci are normal. Vascular: No hyperdense vessel. Skull: No fracture or focal osseous lesion. Sinuses: Mild mucosal thickening in the sphenoid sinuses. Small right maxillary sinus mucous retention cyst. Clear mastoid air cells. Orbits: Faintly increased density versus artifact posteriorly in the globes. CTA HEAD Anterior circulation: The internal carotid arteries are widely patent from skull base to carotid termini. ACAs and MCAs are patent without evidence of proximal branch occlusion or significant proximal stenosis. There is an azygos A2 configuration. No aneurysm is identified. Posterior circulation: The visualized distal vertebral arteries are widely patent to the basilar and codominant. The basilar artery is widely patent. Cerebellar branches are not well evaluated due to their small size. There are medium sized right and small left posterior communicating arteries. PCAs are patent without evidence of significant proximal stenosis. No aneurysm is identified. Venous sinuses: As permitted by contrast timing, patent. Anatomic variants: Azygos A2. Delayed phase: No abnormal enhancement. IMPRESSION: Negative head CTA. No evidence of acute intracranial abnormality. Electronically Signed   By: Sebastian AcheAllen  Grady M.D.   On: 04/09/2019 15:21   [place] Mfm Ob Follow Up  Result Date: 03/25/2019 ----------------------------------------------------------------------  OBSTETRICS REPORT                       (Signed Final 03/25/2019 02:55 pm) ---------------------------------------------------------------------- Patient Info  ID #:       [id number]                          D.O.B.:  Aug 15, 1996 (22 yrs)  Name:       Nichole  Bush                   Visit Date: 03/25/2019 12:39 pm ---------------------------------------------------------------------- Performed By  Performed By:     Hurman Horn          Ref. Address:     [address]                    [address]                                                              Rd                                                             Jacky Kindle                                                             [phone]  Attending:        Lin Landsman      [facility]                    MD                                       Fetal Care  Referred By:      Levie Heritage                    MD ---------------------------------------------------------------------- Orders   #  Description                          Code         Ordered By   1  [place] MFM OB FOLLOW UP                  60454.09     Noralee Space  ----------------------------------------------------------------------   #  Order #                    Accession #                 Episode #   1  [id number]                  [id number]                  [id number]  ---------------------------------------------------------------------- Indications   Hypothyroid                                    O99.280 E03.9   Gestational diabetes in pregnancy,  O24.419   unspecified control   Obesity complicating pregnancy, third          O99.213   trimester   [redacted] weeks gestation of pregnancy                Z3A.33  ---------------------------------------------------------------------- Vital Signs                                                 Height:        5'2" ---------------------------------------------------------------------- Fetal Evaluation  Num Of Fetuses:         1  Fetal Heart Rate(bpm):  142  Cardiac Activity:       Observed  Presentation:           Cephalic  Placenta:               Anterior  P. Cord Insertion:      Visualized, central  Amniotic Fluid  AFI FV:      Subjectively decreased  AFI Sum(cm)     %Tile       Largest Pocket(cm)  6.03            < 3         2.16  RUQ(cm)                     LUQ(cm)        LLQ(cm)  2.06                        1.81           2.16 ---------------------------------------------------------------------- Biometry  BPD:      82.7  mm     G. Age:   33w 2d         40  %    CI:         76.3   %    70 - 86                                                          FL/HC:      19.5   %    19.9 - 21.5  HC:       300   mm     G. Age:  33w 2d         13  %    HC/AC:      0.98        0.96 - 1.11  AC:      305.4  mm     G. Age:  34w 4d         80  %    FL/BPD:     70.7   %    71 - 87  FL:       58.5  mm     G. Age:  30w 4d        < 3  %    FL/AC:      19.2   %    20 - 24  Est. FW:    2132  gm    4 lb 11 oz  53  % ---------------------------------------------------------------------- Gestational Age  LMP:           50w 4d        Date:  04/05/18                 EDD:   01/10/19  U/S Today:     33w 0d                                        EDD:   05/13/19  Best:          33w 3d     Det. ByMarcella Dubs         EDD:   05/10/19                                      (09/17/18) ---------------------------------------------------------------------- Anatomy  Cranium:               Appears normal         Aortic Arch:            Previously seen  Cavum:                 Appears normal         Ductal Arch:            Previously seen  Ventricles:            Previously seen        Diaphragm:              Appears normal  Choroid Plexus:        Previously seen        Stomach:                Appears normal, left                                                                        sided  Cerebellum:            Previously seen        Abdomen:                Appears normal  Posterior Fossa:       Previously seen        Abdominal Wall:         Previously seen  Nuchal Fold:           Previously seen        Cord Vessels:           Previously seen  Face:                  Appears normal         Kidneys:                Previously seen                         (orbits and profile)  Lips:  Previously seen        Bladder:                Appears normal  Thoracic:              Previously seen        Spine:                  Previously seen  Heart:                 Previously seen         Upper Extremities:      Previously seen  RVOT:                  Previously seen        Lower Extremities:      Previously seen  LVOT:                  Previously seen  Other:  Heels and 5th digit visualized. Nasal bone previously seen. ---------------------------------------------------------------------- Impression  Normal interval growth.  Subjectively low fluid ---------------------------------------------------------------------- Recommendations  Repeat amniotic fluid assessment in 1 week  Reviewed kick counts. ----------------------------------------------------------------------               Lin Landsman, MD Electronically Signed Final Report   03/25/2019 02:55 pm ----------------------------------------------------------------------  [place] Mfm Ob Limited  Result Date: 04/01/2019 ----------------------------------------------------------------------  OBSTETRICS REPORT                    (Corrected Final 04/01/2019 02:24 pm) ---------------------------------------------------------------------- Patient Info  ID #:       [id number]                          D.O.B.:  [birth year] (22 yrs)  Name:       Nichole Bush                   Visit Date: 04/01/2019 01:22 pm ---------------------------------------------------------------------- Performed By  Performed By:     Birdena Crandall        Ref. Address:     [address]                    [place]                                                             [address]                                                             Jacky Kindle                                                             [phone]  Attending:        Lin Landsman      [facility]                    MD                                       Fetal Care  Referred By:      Levie Heritage                    MD ---------------------------------------------------------------------- Orders   #  Description                          Code         Ordered By   1  [place] MFM  OB LIMITED                    16109.60     Lin Landsman  ----------------------------------------------------------------------   #  Order #                    Accession #                 Episode #   1  [id number]                  [id number]                  [id number]  ---------------------------------------------------------------------- Indications   Hypothyroid                                    O99.280 E03.9   Gestational diabetes in pregnancy, diet        O24.410   controlled   Obesity complicating pregnancy, third          O99.213   trimester   Elevated blood pressure affecting pregnancy    O13.3   in third trimester   [redacted] weeks gestation of pregnancy                Z3A.34  ---------------------------------------------------------------------- Vital Signs  Weight (lb): 232                               Height:        5'2"  BMI:         42.43  BP:          155/96 ---------------------------------------------------------------------- Fetal Evaluation  Num Of Fetuses:         1  Fetal Heart Rate(bpm):  130  Cardiac Activity:       Observed  Presentation:           Cephalic  Placenta:               Left anterolateral  P. Cord Insertion:      Previously Visualized  Amniotic Fluid  AFI FV:      Within normal limits  AFI Sum(cm)     %Tile  Largest Pocket(cm)  12.85           41          4.72  RUQ(cm)       RLQ(cm)       LUQ(cm)        LLQ(cm)  2.76          4.72          1.72           3.65 ---------------------------------------------------------------------- Gestational Age  LMP:           51w 4d        Date:  04/05/18                 EDD:   01/10/19  Best:          34w 3d     Det. ByMarcella Dubs         EDD:   05/10/19                                      (09/17/18) ---------------------------------------------------------------------- Anatomy  Stomach:               Appears normal, left   Bladder:                Appears normal                          sided  Kidneys:               Appear normal ---------------------------------------------------------------------- Cervix Uterus Adnexa  Cervix  Not adaquately visualized ---------------------------------------------------------------------- Impression  Normal amniotic fluid volume  Blood pressure elevated- seen today in clinic  New diagnosis of A2GDM ---------------------------------------------------------------------- Recommendations  Follow up growth in 3 weeks  Follow up BPP in 1 week  Repeat BP on Friday in clinic ----------------------------------------------------------------------                    Lin Landsman, MD Electronically Signed Corrected Final Report  04/01/2019 02:24 pm ----------------------------------------------------------------------   Assessment & Plan:  2 week PP check. BP looks good   HA-  rec Excedrine.   Pt needs to resume visits to Neuro  Carpal tunnel  Rx sent for Cock up splints to both wrists at night.   Shavonda Wiedman L. Harraway-Smith, M.D., Evern Core

## 2019-04-24 ENCOUNTER — Ambulatory Visit: Payer: Medicaid Other | Admitting: Obstetrics & Gynecology

## 2019-05-08 ENCOUNTER — Other Ambulatory Visit: Payer: Self-pay

## 2019-05-08 ENCOUNTER — Encounter: Payer: Self-pay | Admitting: Family Medicine

## 2019-05-08 ENCOUNTER — Ambulatory Visit: Payer: Medicaid Other | Admitting: Family Medicine

## 2019-05-08 ENCOUNTER — Ambulatory Visit (INDEPENDENT_AMBULATORY_CARE_PROVIDER_SITE_OTHER): Payer: Medicaid Other | Admitting: Family Medicine

## 2019-05-08 VITALS — BP 107/63 | HR 78 | Ht 62.0 in | Wt 208.0 lb

## 2019-05-08 DIAGNOSIS — Z5189 Encounter for other specified aftercare: Secondary | ICD-10-CM

## 2019-05-08 MED ORDER — SULFAMETHOXAZOLE-TRIMETHOPRIM 800-160 MG PO TABS: 1.0000 | ORAL_TABLET | Freq: Two times a day (BID) | ORAL | 0 refills | Status: DC

## 2019-05-08 NOTE — Progress Notes (Signed)
   Subjective:    Patient ID: Nichole Bush, female    DOB: 03/18/1996, 23 y.o.   MRN: [id number]  HPI Patient here 4 weeks postpartum from [facility] for arrest of dilation. Comes for concerns of pain and pulling in her incision - can feel a stitch sticking out of her incision.   Review of Systems     Objective:   Physical Exam Abdominal:     Comments: Incision well healed - there is a stitch running over the incision. Some erythema and warm to the area. Tender to touch.   Neurological:     Mental Status: She is alert.        Assessment & Plan:  1. Visit for wound check Stitch removed. Concerns for developing wound infection. Will start bactrim twice a day. Instructed patient to keep incision dry. Follow up in 1 week for PP visit.

## 2019-05-14 IMAGING — US US MFM OB FOLLOW UP
1 series · 14 of 28 positions shown · non-contrast
Comparison: none

[Series 1: us mfm ob follow up · 57 acquisitions, 14 frames shown]
[im 3/57]
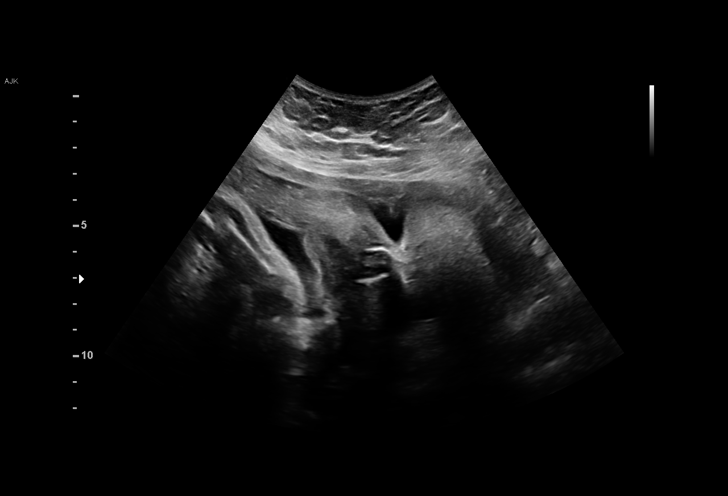
[im 7/57]
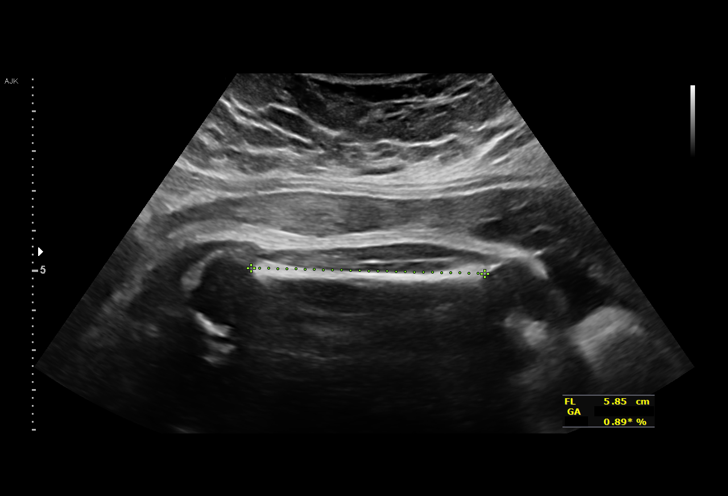
[im 11/57]
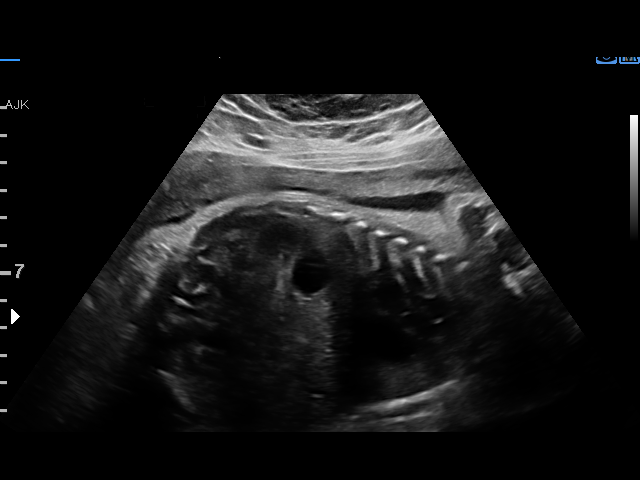
[im 15/57]
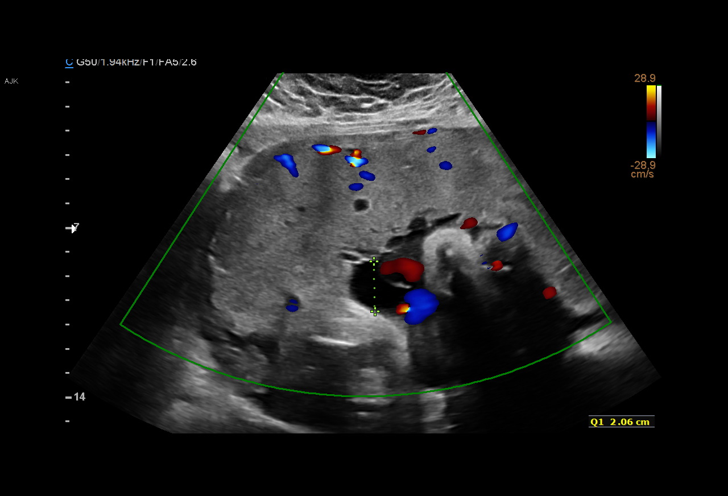
[im 19/57]
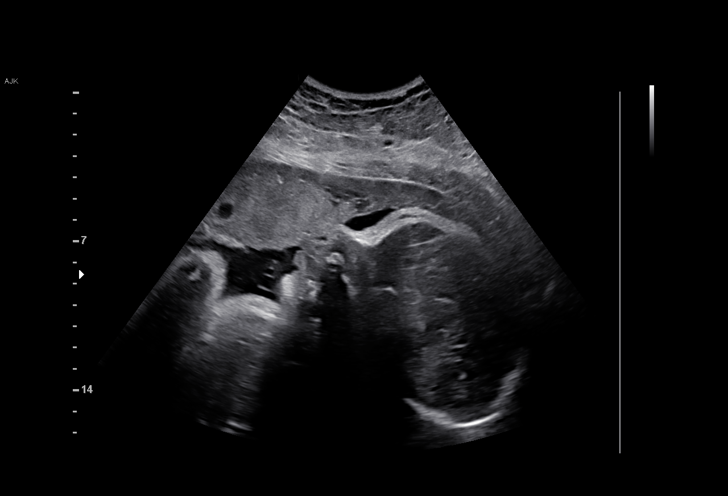
[im 23/57]
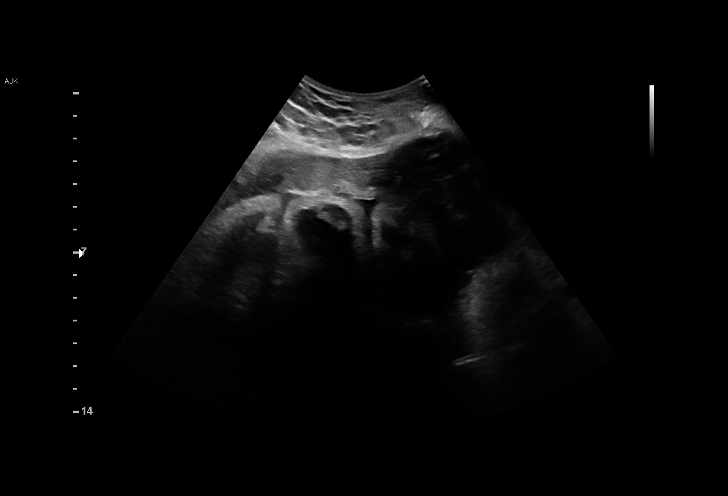
[im 27/57]
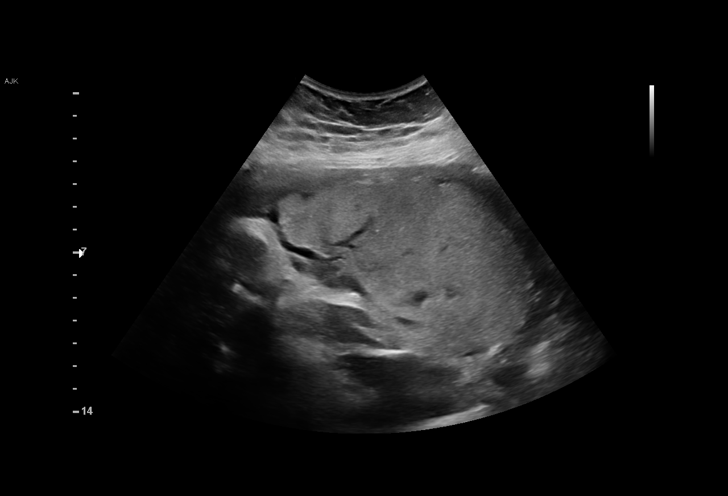
[im 32/57]
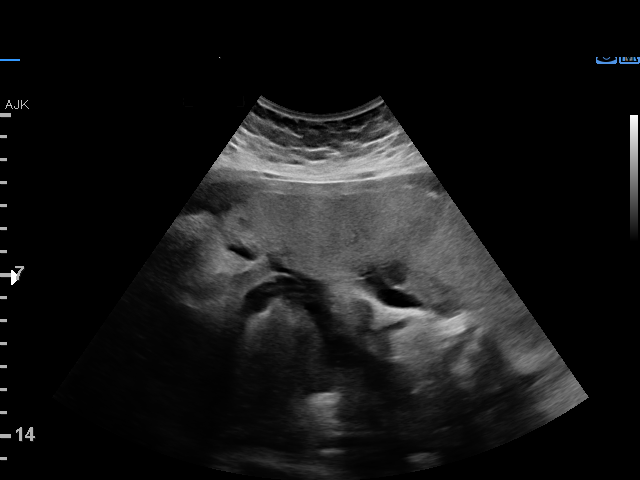
[im 36/57]
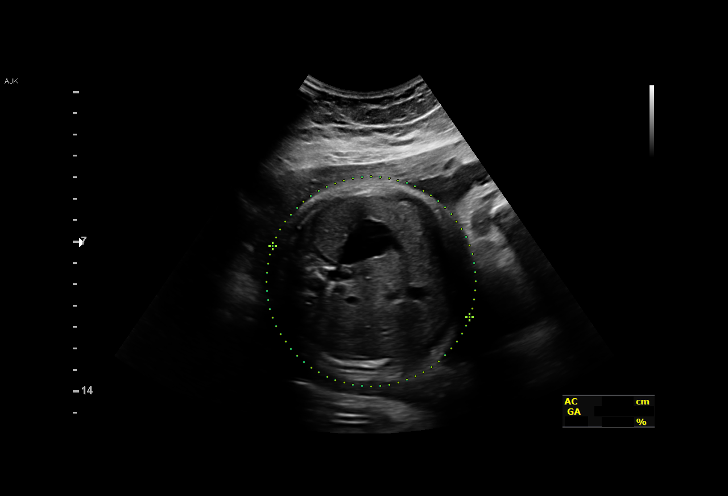
[im 40/57]
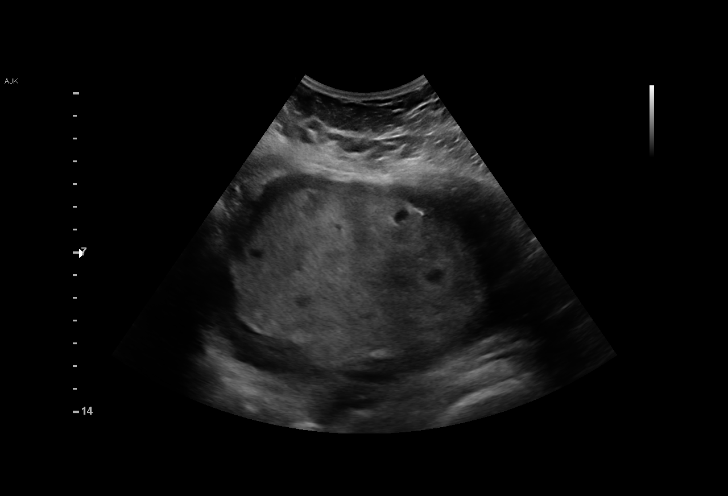
[im 44/57]
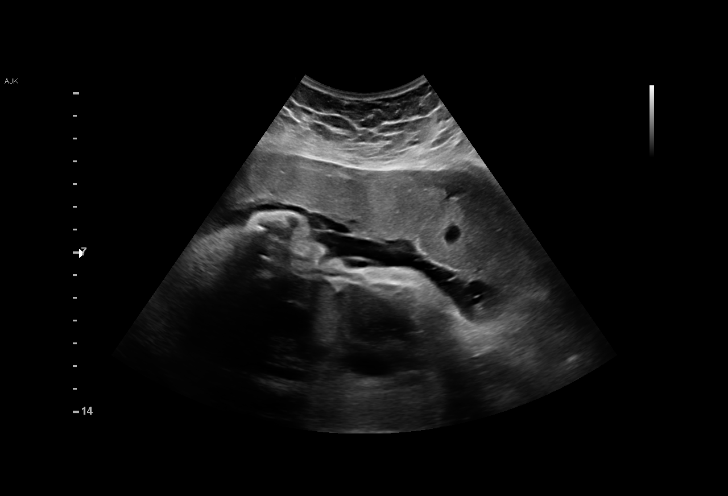
[im 48/57]
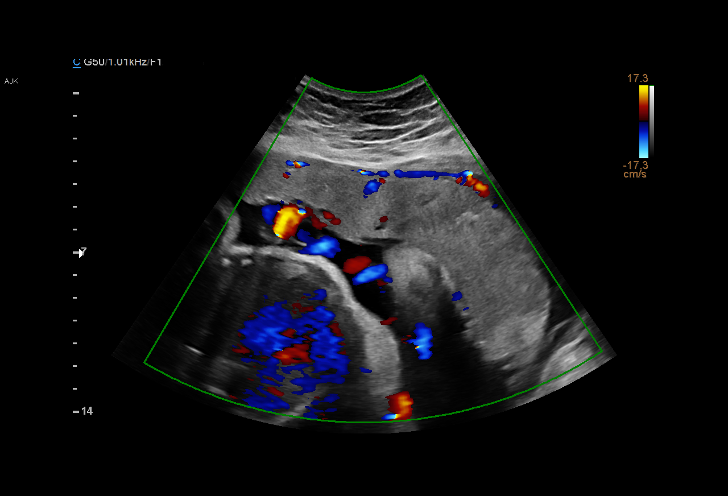
[im 52/57]
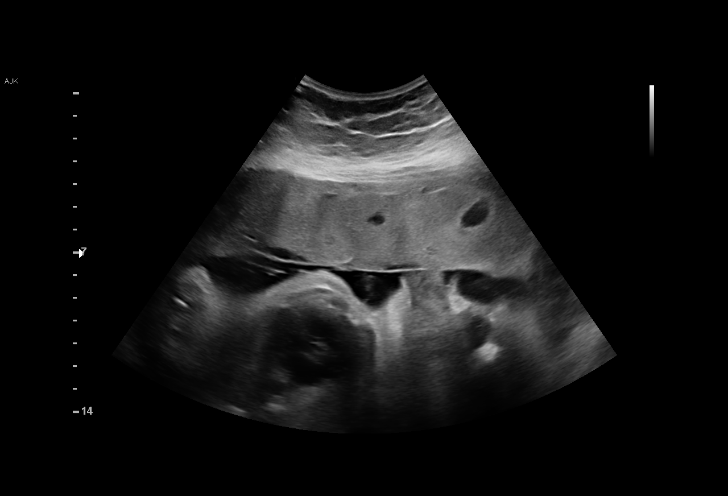
[im 57/57]
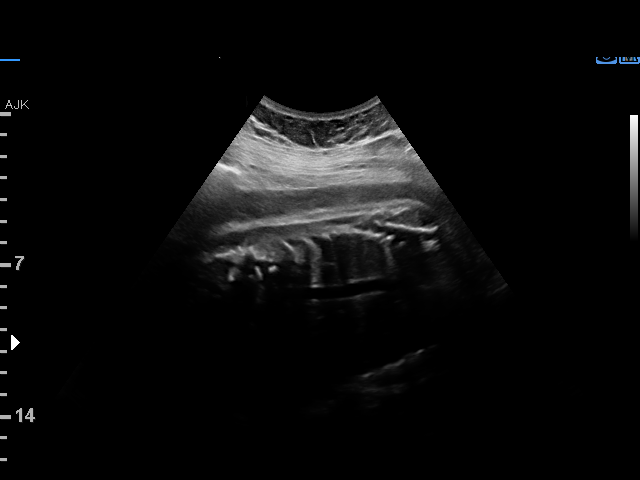

[14 of 28 positions shown; findings below may reference images not displayed]

----------------------------------------------------------------------

 ----------------------------------------------------------------------
Indications

  Hypothyroid
  Gestational diabetes in pregnancy,
  unspecified control
  Obesity complicating pregnancy, third
  trimester
  33 weeks gestation of pregnancy
 ----------------------------------------------------------------------
Vital Signs

                                                Height:        5'2"
Fetal Evaluation

 Num Of Fetuses:         1
 Fetal Heart Rate(bpm):  142
 Cardiac Activity:       Observed
 Presentation:           Cephalic
 Placenta:               Anterior
 P. Cord Insertion:      Visualized, central

 Amniotic Fluid
 AFI FV:      Subjectively decreased

 AFI Sum(cm)     %Tile       Largest Pocket(cm)
 6.03            < 3

 RUQ(cm)                     LUQ(cm)        LLQ(cm)

Biometry
 BPD:      82.7  mm     G. Age:  33w 2d         40  %    CI:         76.3   %    70 - 86
                                                         FL/HC:      19.5   %    19.9 -
 HC:       300   mm     G. Age:  33w 2d         13  %    HC/AC:      0.98        0.96 -
 AC:      305.4  mm     G. Age:  34w 4d         80  %    FL/BPD:     70.7   %    71 - 87
 FL:       58.5  mm     G. Age:  30w 4d        < 3  %    FL/AC:      19.2   %    20 - 24

 Est. FW:    0810  gm    4 lb 11 oz      53  %
Gestational Age

 LMP:           50w 4d        Date:  04/05/18                 EDD:   01/10/19
 U/S Today:     33w 0d                                        EDD:   05/13/19
 Best:          33w 3d     Det. By:  Early Ultrasound         EDD:   05/10/19
                                     (09/17/18)
Anatomy

 Cranium:               Appears normal         Aortic Arch:            Previously seen
 Cavum:                 Appears normal         Ductal Arch:            Previously seen
 Ventricles:            Previously seen        Diaphragm:              Appears normal
 Choroid Plexus:        Previously seen        Stomach:                Appears normal, left
                                                                       sided
 Cerebellum:            Previously seen        Abdomen:                Appears normal
 Posterior Fossa:       Previously seen        Abdominal Wall:         Previously seen
 Nuchal Fold:           Previously seen        Cord Vessels:           Previously seen
 Face:                  Appears normal         Kidneys:                Previously seen
                        (orbits and profile)
 Lips:                  Previously seen        Bladder:                Appears normal
 Thoracic:              Previously seen        Spine:                  Previously seen
 Heart:                 Previously seen        Upper Extremities:      Previously seen
 RVOT:                  Previously seen        Lower Extremities:      Previously seen
 LVOT:                  Previously seen

 Other:  Heels and 5th digit visualized. Nasal bone previously seen.
Impression

 Normal interval growth.
 Subjectively low fluid
Recommendations

 Repeat amniotic fluid assessment in 1 week
 Reviewed Abimelk Tiger.

## 2019-05-19 ENCOUNTER — Other Ambulatory Visit: Payer: Self-pay

## 2019-05-19 ENCOUNTER — Encounter: Payer: Self-pay | Admitting: Family Medicine

## 2019-05-19 ENCOUNTER — Ambulatory Visit (INDEPENDENT_AMBULATORY_CARE_PROVIDER_SITE_OTHER): Payer: Medicaid Other | Admitting: Family Medicine

## 2019-05-19 ENCOUNTER — Other Ambulatory Visit (HOSPITAL_COMMUNITY)
Admission: RE | Admit: 2019-05-19 | Discharge: 2019-05-19 | Disposition: A | Discharge status: Home / Self Care | Payer: Medicaid Other | Source: Ambulatory Visit | Attending: Family Medicine | Admitting: Family Medicine

## 2019-05-19 DIAGNOSIS — Z3202 Encounter for pregnancy test, result negative: Secondary | ICD-10-CM | POA: Diagnosis not present

## 2019-05-19 DIAGNOSIS — Z30430 Encounter for insertion of intrauterine contraceptive device: Secondary | ICD-10-CM | POA: Diagnosis not present

## 2019-05-19 LAB — POCT URINE PREGNANCY: Preg Test, Ur: NEGATIVE

## 2019-05-19 MED ORDER — LEVONORGESTREL 19.5 MCG/DAY IU IUD
INTRAUTERINE_SYSTEM | Freq: Once | INTRAUTERINE | Status: AC
  Administered 2019-05-19: 1 via INTRAUTERINE

## 2019-05-19 MED ORDER — IBUPROFEN 200 MG PO TABS
800.0000 mg | ORAL_TABLET | Freq: Once | ORAL | Status: AC
  Administered 2019-05-19: 800 mg via ORAL

## 2019-05-19 NOTE — Progress Notes (Signed)
Subjective:     Nichole Augusta is a 23 y.o. female who presents for a postpartum visit. She is 5 weeks postpartum following a low cervical transverse Cesarean section. I have fully reviewed the prenatal and intrapartum course. The delivery was at 35 gestational weeks. Outcome: primary cesarean section, low transverse incision. Anesthesia: epidural. Postpartum course has been normal. Baby's course has been normal. Baby is feeding by bottle Dory Horn Soothe. Bleeding red. Bowel function is normal. Bladder function is normal. Patient is sexually active. Contraception method is IUD. Postpartum depression screening: negative.  The following portions of the patient's history were reviewed and updated as appropriate: allergies, current medications, past family history, past medical history, past social history, past surgical history and problem list.  Review of Systems Pertinent items are noted in HPI.   Objective:  BP (!) 111/51   Pulse 75   Ht 5\' 2"  (1.575 m)   Wt 213 lb (96.6 kg)   LMP 04/05/2018   BMI 38.96 kg/m    LMP 04/05/2018   General:  alert, cooperative and no distress  Lungs: clear to auscultation bilaterally  Heart:  regular rate and rhythm, S1, S2 normal, no murmur, click, rub or gallop  Abdomen: soft, non-tender; bowel sounds normal; no masses,  no organomegaly. Incision well healed   Vulva:  normal  Vagina: normal vagina, no discharge, exudate, lesion, or erythema  Cervix:  anteverted and multiparous appearance  Corpus: normal size, contour, position, consistency, mobility, non-tender  Adnexa:  normal adnexa        IUD Procedure Note Patient identified, informed consent performed, signed copy in chart, time out was performed.  Urine pregnancy test negative.  Speculum placed in the vagina.  Cervix visualized.  Cleaned with Betadine x 2.  Grasped anteriorly with a single tooth tenaculum.  Uterus sounded to 10 cm.  Liletta  IUD placed per manufacturer's recommendations.  Strings  trimmed to 3 cm. Tenaculum was removed, good hemostasis noted.  Patient tolerated procedure well.   Patient given post procedure instructions and Liletta care card with expiration date.  Patient is asked to check IUD strings periodically and follow up in 4-6 weeks for IUD check.   Assessment:     normal postpartum exam. Pap smear done at today's visit.   Plan:    1. Contraception: IUD 2. 2hr GTT at next appointment 3. TSH to be followed by primary doctor. 3. Follow up in: 1 months or as needed.

## 2019-05-19 NOTE — Addendum Note (Signed)
Addended by: Wendelyn Breslow L on: 05/19/2019 11:15 AM   Modules accepted: Orders

## 2019-05-19 NOTE — Addendum Note (Signed)
Addended by: Wendelyn Breslow L on: 05/19/2019 11:02 AM   Modules accepted: Orders

## 2019-05-20 ENCOUNTER — Ambulatory Visit: Payer: Medicaid Other | Admitting: Advanced Practice Midwife

## 2019-05-20 LAB — CYTOLOGY - PAP: Diagnosis: NEGATIVE

## 2019-06-20 ENCOUNTER — Encounter: Payer: Self-pay | Admitting: Obstetrics & Gynecology

## 2019-06-20 ENCOUNTER — Other Ambulatory Visit: Payer: Self-pay

## 2019-06-20 ENCOUNTER — Ambulatory Visit (INDEPENDENT_AMBULATORY_CARE_PROVIDER_SITE_OTHER): Payer: Medicaid Other | Admitting: Obstetrics & Gynecology

## 2019-06-20 VITALS — BP 118/80 | HR 62 | Ht 62.0 in | Wt 209.1 lb

## 2019-06-20 DIAGNOSIS — Z30430 Encounter for insertion of intrauterine contraceptive device: Secondary | ICD-10-CM

## 2019-06-20 DIAGNOSIS — Z30431 Encounter for routine checking of intrauterine contraceptive device: Secondary | ICD-10-CM | POA: Diagnosis not present

## 2019-06-20 NOTE — Progress Notes (Signed)
History:  23 y.o. G1P0101 here today for today for IUD string check; LnIUD was placed  05/19/2019. She reports daily bleeding that is decreasing in amount. No complaints about the IUD, no concerning side effects.  The following portions of the patient's history were reviewed and updated as appropriate: allergies, current medications, past family history, past medical history, past social history, past surgical history and problem list. Last pap smear on 05/19/2019 was normal  Review of Systems:  Pertinent items are noted in HPI.    Objective:  BP 118/80   Pulse 62   Ht 5\' 2"  (1.575 m)   Wt 209 lb 1.9 oz (94.9 kg)   LMP 04/05/2018   BMI 38.25 kg/m  CONSTITUTIONAL: Well-developed, well-nourished female in no acute distress.  HENT:  Normocephalic, atraumatic EYES: Conjunctivae and EOM are normal. No scleral icterus.  NECK: Normal range of motion SKIN: Skin is warm and dry. No rash noted. Not diaphoretic.No pallor. Lazy Y U: Alert and oriented to person, place, and time. Normal coordination.  GU: EGBUS: no lesions Vagina: no blood in vault Cervix: no lesion; no mucopurulent d/c; IUD strings noted at cervix.    Assessment & Plan:  Normal IUD check. Patient to keep IUD in place for five years; can come in for removal if she desires pregnancy within the next five years. Routine preventative health maintenance measures emphasized. Needs 2 hour GTT- scheduled for 1 week  Yuriko Portales L. Harraway-Smith, M.D., Cherlynn June

## 2019-06-25 ENCOUNTER — Other Ambulatory Visit: Payer: Medicaid Other

## 2019-06-30 ENCOUNTER — Other Ambulatory Visit: Payer: Medicaid Other

## 2019-09-15 ENCOUNTER — Ambulatory Visit: Payer: Medicaid Other | Admitting: Women's Health

## 2019-11-06 ENCOUNTER — Encounter: Payer: Self-pay | Admitting: Family Medicine

## 2019-11-06 ENCOUNTER — Ambulatory Visit (INDEPENDENT_AMBULATORY_CARE_PROVIDER_SITE_OTHER): Payer: Medicaid Other | Admitting: Family Medicine

## 2019-11-06 ENCOUNTER — Other Ambulatory Visit: Payer: Self-pay

## 2019-11-06 VITALS — BP 110/68 | HR 84 | Ht 62.0 in | Wt 217.0 lb

## 2019-11-06 DIAGNOSIS — Z30432 Encounter for removal of intrauterine contraceptive device: Secondary | ICD-10-CM

## 2019-11-06 MED ORDER — NORGESTIMATE-ETH ESTRADIOL 0.25-35 MG-MCG PO TABS: 1.0000 | ORAL_TABLET | Freq: Every day | ORAL | 11 refills | Status: DC

## 2019-11-06 NOTE — Progress Notes (Signed)
Patient would like her IUD out today. Patient complaining of heavier periods and random pains since insertion. Patient has had IUD for approx. 6 months. Kathrene Alu RN

## 2019-11-06 NOTE — Progress Notes (Signed)
Patient having increased bleeding for 2-3 weeks at a time and would like IUD removed. She would like to use OCPs instead. Discussed use failure rates, what to do if misses pill, etc.f/u 3 months.  IUD Removal  Patient was in the dorsal lithotomy position, normal external genitalia was noted.  A speculum was placed in the patient's vagina, normal discharge was noted, no lesions. The multiparous cervix was visualized, no lesions, no abnormal discharge,  and was swabbed with Betadine using scopettes.  The strings of the IUD was grasped and pulled using ring forceps.  The IUD was successfully removed in its entirety.  Patient tolerated the procedure well.

## 2020-02-02 ENCOUNTER — Telehealth: Payer: Medicaid Other | Admitting: Family Medicine

## 2020-03-03 ENCOUNTER — Ambulatory Visit: Payer: Medicaid Other | Attending: Internal Medicine

## 2020-03-03 DIAGNOSIS — Z20822 Contact with and (suspected) exposure to covid-19: Secondary | ICD-10-CM

## 2020-03-04 LAB — NOVEL CORONAVIRUS, NAA: SARS-CoV-2, NAA: NOT DETECTED

## 2020-05-13 ENCOUNTER — Telehealth: Payer: Self-pay

## 2020-05-13 NOTE — Telephone Encounter (Signed)
Patient needs note saying she is able to work. Armandina Stammer RN

## 2020-07-26 ENCOUNTER — Other Ambulatory Visit: Payer: Self-pay

## 2020-07-26 ENCOUNTER — Other Ambulatory Visit (HOSPITAL_COMMUNITY)
Admission: RE | Admit: 2020-07-26 | Discharge: 2020-07-26 | Disposition: A | Discharge status: Home / Self Care | Payer: Medicaid Other | Source: Ambulatory Visit | Attending: Family Medicine | Admitting: Family Medicine

## 2020-07-26 ENCOUNTER — Encounter: Payer: Self-pay | Admitting: Family Medicine

## 2020-07-26 ENCOUNTER — Ambulatory Visit (INDEPENDENT_AMBULATORY_CARE_PROVIDER_SITE_OTHER): Payer: Medicaid Other | Admitting: Family Medicine

## 2020-07-26 VITALS — BP 96/64 | HR 77 | Wt 234.0 lb

## 2020-07-26 DIAGNOSIS — Z3481 Encounter for supervision of other normal pregnancy, first trimester: Secondary | ICD-10-CM | POA: Diagnosis not present

## 2020-07-26 DIAGNOSIS — Z3480 Encounter for supervision of other normal pregnancy, unspecified trimester: Secondary | ICD-10-CM | POA: Insufficient documentation

## 2020-07-26 DIAGNOSIS — Z98891 History of uterine scar from previous surgery: Secondary | ICD-10-CM | POA: Insufficient documentation

## 2020-07-26 DIAGNOSIS — E038 Other specified hypothyroidism: Secondary | ICD-10-CM

## 2020-07-26 DIAGNOSIS — J4520 Mild intermittent asthma, uncomplicated: Secondary | ICD-10-CM

## 2020-07-26 DIAGNOSIS — Z3A08 8 weeks gestation of pregnancy: Secondary | ICD-10-CM

## 2020-07-26 DIAGNOSIS — O09291 Supervision of pregnancy with other poor reproductive or obstetric history, first trimester: Secondary | ICD-10-CM

## 2020-07-26 DIAGNOSIS — O09299 Supervision of pregnancy with other poor reproductive or obstetric history, unspecified trimester: Secondary | ICD-10-CM | POA: Insufficient documentation

## 2020-07-26 DIAGNOSIS — E039 Hypothyroidism, unspecified: Secondary | ICD-10-CM

## 2020-07-26 LAB — POCT URINALYSIS DIPSTICK OB
Glucose, UA: NEGATIVE
Leukocytes, UA: NEGATIVE
Nitrite, UA: NEGATIVE
Spec Grav, UA: 1.015 (ref 1.010–1.025)
pH, UA: 6.5 (ref 5.0–8.0)

## 2020-07-26 MED ORDER — PROMETHAZINE HCL 25 MG PO TABS: 25.0000 mg | ORAL_TABLET | Freq: Four times a day (QID) | ORAL | 2 refills | Status: DC | PRN

## 2020-07-26 NOTE — Progress Notes (Signed)
Subjective:  Nichole Bush is a [id number] [redacted]w[redacted]d being seen today for her first obstetrical visit.  Her obstetrical history is significant for pre-eclampsia and prior cesarean section. Unplanned, but desired pregnancy. FOB in patient's partner. Patient does intend to breast feed. Pregnancy history fully reviewed.  Patient reports nausea.  BP 96/64   Pulse 77   Wt 234 lb (106.1 kg)   LMP 05/28/2020 (Approximate)   BMI 42.80 kg/m   HISTORY: OB History  Gravida Para Term Preterm AB Living  2 1   1   1   SAB TAB Ectopic Multiple Live Births        0 1    # Outcome Date GA Lbr Len/2nd Weight Sex Delivery Anes PTL Lv  2 Current           1 Preterm 04/10/19 [redacted]w[redacted]d 04:45 / 02:46 5 lb 7.1 oz (2.47 kg) M CS-LTranv EPI  LIV     Birth Comments: wnl    Past Medical History:  Diagnosis Date  . Constipation   . Gestational diabetes   . 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 Hypertension   . Migraine   . Thyroid disease     Past Surgical History:  Procedure Laterality Date  . CESAREAN SECTION N/A 04/10/2019   Procedure: CESAREAN SECTION;  Surgeon: 06/10/2019, MD;  Location: [facility];  Service: Obstetrics;  Laterality: N/A;  . ESOPHAGOGASTRODUODENOSCOPY    . WISDOM TOOTH EXTRACTION      Family History  Problem Relation Age of Onset  . Migraines Father   . Hypertension Father      Exam  BP 96/64   Pulse 77   Wt 234 lb (106.1 kg)   LMP 05/28/2020 (Approximate)   BMI 42.80 kg/m   Chaperone present during exam  CONSTITUTIONAL: Well-developed, well-nourished female in no acute distress.  HENT:  Normocephalic, atraumatic, External right and left ear normal. Oropharynx is clear and moist EYES: Conjunctivae and EOM are normal. Pupils are equal, round, and reactive to light. No scleral icterus.  NECK: Normal range of motion, supple, no masses.  Normal thyroid.  CARDIOVASCULAR: Normal heart rate noted, regular rhythm RESPIRATORY: Clear to auscultation bilaterally. Effort and  breath sounds normal, no problems with respiration noted. BREASTS: declined ABDOMEN: Soft, normal bowel sounds, no distention noted.  No tenderness, rebound or guarding.  PELVIC: Normal appearing external genitalia; normal appearing vaginal mucosa and cervix. No abnormal discharge noted. Normal uterine size, no other palpable masses, no uterine or adnexal tenderness. MUSCULOSKELETAL: Normal range of motion. No tenderness.  No cyanosis, clubbing, or edema.  2+ distal pulses. SKIN: Skin is warm and dry. No rash noted. Not diaphoretic. No erythema. No pallor. NEUROLOGIC: Alert and oriented to person, place, and time. Normal reflexes, muscle tone coordination. No cranial nerve deficit noted. PSYCHIATRIC: Normal mood and affect. Normal behavior. Normal judgment and thought content.    Assessment:    Pregnancy: G2P0101 Patient Active Problem List   Diagnosis Date Noted  . Supervision of other normal pregnancy, antepartum 07/26/2020  . History of pre-eclampsia in prior pregnancy, currently pregnant 07/26/2020  . History of cesarean delivery 07/26/2020  . Vitamin D deficiency 12/27/2018  . Henoch-Schonlein purpura (HCC) 02/26/2018  . Gastroesophageal reflux disease with esophagitis 09/18/2016  . Asthma 04/18/2016  . Subclinical hypothyroidism 06/25/2015      Plan:   1. Supervision of other normal pregnancy, antepartum FHT and FH normal Desires Panorama - CBC/D/Plt+RPR+Rh+ABO+Rub Ab... - Culture, OB Urine - GC/Chlamydia probe amp (  [place])not at [facility] - SMN1 COPY NUMBER ANALYSIS (SMA Carrier Screen) - Hemoglobpathy+Fer w/A Thal Rfx - Hemoglobin A1c - Comprehensive metabolic panel - Protein / creatinine ratio, urine - CHL AMB BABYSCRIPTS OPT IN - POC Urinalysis Dipstick OB  2. [redacted] weeks gestation of pregnancy - CBC/D/Plt+RPR+Rh+ABO+Rub Ab... - Culture, OB Urine - GC/Chlamydia probe amp ([place])not at [facility] - SMN1 COPY NUMBER ANALYSIS (SMA Carrier Screen) - Hemoglobpathy+Fer  w/A Thal Rfx - Hemoglobin A1c - Comprehensive metabolic panel - Protein / creatinine ratio, urine - CHL AMB BABYSCRIPTS OPT IN - POC Urinalysis Dipstick OB  3. History of pre-eclampsia in prior pregnancy, currently pregnant ASA 81mg  at 12 weeks Check CMP, UP:C  4. History of cesarean delivery Would like to try VBAC if she goes into labor on her own. If not, then will do RLTCS  5. Subclinical hypothyroidism Check thyroid studies next appt.  6. Mild intermittent asthma without complication   Problem list reviewed and updated. 75% of 30 min visit spent on counseling and coordination of care.     07/26/2020

## 2020-07-26 NOTE — Progress Notes (Signed)
DATING AND VIABILITY SONOGRAM   Nichole Bush is a 24 y.o. year old G87P0101 with LMP Patient's last menstrual period was 05/28/2020 (approximate). which would correlate to  [redacted]w[redacted]d weeks gestation.  She has regular menstrual cycles.   She is here today for a confirmatory initial sonogram.    GESTATION: [place]LE[place]     FETAL ACTIVITY:          Heart rate 171          The fetus is active.    GESTATIONAL AGE AND  BIOMETRICS:  Gestational criteria: Estimated Date of Delivery: 03/04/21 by LMP now at [redacted]w[redacted]d  Previous Scans:0  GESTATIONAL SAC           2.20 cm        7-1 weeks  CROWN RUMP LENGTH           1.76 cm         8-1 weeks                                                                               AVERAGE EGA(BY THIS SCAN): 7-5 weeks  WORKING EDD( LMP):  03/04/2021     TECHNICIAN COMMENTS:  Patient informed that the ultrasound is considered a limited obstetric ultrasound and is not intended to be a complete ultrasound exam. Patient also informed that the ultrasound is not being completed with the intent of assessing for fetal or placental anomalies or any pelvic abnormalities. Explained that the purpose of today's ultrasound is to assess for fetal heart rate. Patient acknowledges the purpose of the exam and the limitations of the study.     Armandina Stammer 07/26/2020 8:59 AM

## 2020-07-27 LAB — GC/CHLAMYDIA PROBE AMP (~~LOC~~) NOT AT ARMC
Chlamydia: NEGATIVE
Comment: NEGATIVE
Comment: NORMAL
Neisseria Gonorrhea: NEGATIVE

## 2020-07-27 LAB — PROTEIN / CREATININE RATIO, URINE
Creatinine, Urine: 201.8 mg/dL
Protein, Ur: 19.1 mg/dL
Protein/Creat Ratio: 95 mg/g creat (ref 0–200)

## 2020-07-28 LAB — URINE CULTURE, OB REFLEX

## 2020-07-28 LAB — CULTURE, OB URINE

## 2020-08-04 LAB — CBC/D/PLT+RPR+RH+ABO+RUB AB...
Antibody Screen: NEGATIVE
Basophils Absolute: 0 10*3/uL (ref 0.0–0.2)
Basos: 1 %
EOS (ABSOLUTE): 0.1 10*3/uL (ref 0.0–0.4)
Eos: 2 %
HCV Ab: <0.1 s/co ratio (ref 0.0–0.9)
HIV Screen 4th Generation wRfx: NONREACTIVE
Hematocrit: 38.7 % (ref 34.0–46.6)
Hemoglobin: 13.2 g/dL (ref 11.1–15.9)
Hepatitis B Surface Ag: NEGATIVE
Immature Grans (Abs): 0 10*3/uL (ref 0.0–0.1)
Immature Granulocytes: 0 %
Lymphocytes Absolute: 3.7 10*3/uL — ABNORMAL HIGH (ref 0.7–3.1)
Lymphs: 46 %
MCH: 31.8 pg (ref 26.6–33.0)
MCHC: 34.1 g/dL (ref 31.5–35.7)
MCV: 93 fL (ref 79–97)
Monocytes Absolute: 0.4 10*3/uL (ref 0.1–0.9)
Monocytes: 5 %
Neutrophils Absolute: 3.7 10*3/uL (ref 1.4–7.0)
Neutrophils: 46 %
Platelets: 264 10*3/uL (ref 150–450)
RBC: 4.15 x10E6/uL (ref 3.77–5.28)
RDW: 11.7 % (ref 11.7–15.4)
RPR Ser Ql: NONREACTIVE
Rh Factor: POSITIVE
Rubella Antibodies, IGG: 2.08 index (ref >0.99)
WBC: 8 10*3/uL (ref 3.4–10.8)

## 2020-08-04 LAB — SMN1 COPY NUMBER ANALYSIS (SMA CARRIER SCREENING)

## 2020-08-04 LAB — HEMOGLOBPATHY+FER W/A THAL RFX
Ferritin: 49 ng/mL (ref 15–150)
Hgb A2: 2.5 % (ref 1.8–3.2)
Hgb A: 97.5 % (ref 96.4–98.8)
Hgb F: 0 % (ref 0.0–2.0)
Hgb S: 0 %

## 2020-08-04 LAB — COMPREHENSIVE METABOLIC PANEL
ALT: 60 IU/L — ABNORMAL HIGH (ref 0–32)
AST: 58 IU/L — ABNORMAL HIGH (ref 0–40)
Albumin/Globulin Ratio: 1.1 — ABNORMAL LOW (ref 1.2–2.2)
Albumin: 3.7 g/dL — ABNORMAL LOW (ref 3.9–5.0)
Alkaline Phosphatase: 93 IU/L (ref 48–121)
BUN/Creatinine Ratio: 10 (ref 9–23)
BUN: 5 mg/dL — ABNORMAL LOW (ref 6–20)
Bilirubin Total: 0.2 mg/dL (ref 0.0–1.2)
CO2: 19 mmol/L — ABNORMAL LOW (ref 20–29)
Calcium: 9.3 mg/dL (ref 8.7–10.2)
Chloride: 102 mmol/L (ref 96–106)
Creatinine, Ser: 0.52 mg/dL — ABNORMAL LOW (ref 0.57–1.00)
GFR calc Af Amer: 157 mL/min/{1.73_m2} (ref >59)
GFR calc non Af Amer: 136 mL/min/{1.73_m2} (ref >59)
Globulin, Total: 3.4 g/dL (ref 1.5–4.5)
Glucose: 79 mg/dL (ref 65–99)
Potassium: 4.1 mmol/L (ref 3.5–5.2)
Sodium: 135 mmol/L (ref 134–144)
Total Protein: 7.1 g/dL (ref 6.0–8.5)

## 2020-08-04 LAB — HEMOGLOBIN A1C
Est. average glucose Bld gHb Est-mCnc: 100 mg/dL
Hgb A1c MFr Bld: 5.1 % (ref 4.8–5.6)

## 2020-08-04 LAB — HCV INTERPRETATION

## 2020-08-16 ENCOUNTER — Encounter (HOSPITAL_COMMUNITY): Payer: Self-pay | Admitting: Obstetrics & Gynecology

## 2020-08-16 ENCOUNTER — Inpatient Hospital Stay (HOSPITAL_COMMUNITY)
Admission: AD | Admit: 2020-08-16 | Discharge: 2020-08-16 | Disposition: A | Discharge status: Home / Self Care | Payer: Medicaid Other | Attending: Obstetrics & Gynecology | Admitting: Obstetrics & Gynecology

## 2020-08-16 ENCOUNTER — Other Ambulatory Visit: Payer: Self-pay

## 2020-08-16 DIAGNOSIS — E079 Disorder of thyroid, unspecified: Secondary | ICD-10-CM | POA: Insufficient documentation

## 2020-08-16 DIAGNOSIS — Z3A11 11 weeks gestation of pregnancy: Secondary | ICD-10-CM | POA: Insufficient documentation

## 2020-08-16 DIAGNOSIS — O99511 Diseases of the respiratory system complicating pregnancy, first trimester: Secondary | ICD-10-CM | POA: Insufficient documentation

## 2020-08-16 DIAGNOSIS — Z79899 Other long term (current) drug therapy: Secondary | ICD-10-CM | POA: Diagnosis not present

## 2020-08-16 DIAGNOSIS — Z8249 Family history of ischemic heart disease and other diseases of the circulatory system: Secondary | ICD-10-CM | POA: Insufficient documentation

## 2020-08-16 DIAGNOSIS — O26891 Other specified pregnancy related conditions, first trimester: Secondary | ICD-10-CM | POA: Insufficient documentation

## 2020-08-16 DIAGNOSIS — O10911 Unspecified pre-existing hypertension complicating pregnancy, first trimester: Secondary | ICD-10-CM | POA: Diagnosis not present

## 2020-08-16 DIAGNOSIS — Z880 Allergy status to penicillin: Secondary | ICD-10-CM | POA: Diagnosis not present

## 2020-08-16 DIAGNOSIS — O99281 Endocrine, nutritional and metabolic diseases complicating pregnancy, first trimester: Secondary | ICD-10-CM | POA: Insufficient documentation

## 2020-08-16 DIAGNOSIS — O219 Vomiting of pregnancy, unspecified: Secondary | ICD-10-CM | POA: Diagnosis not present

## 2020-08-16 DIAGNOSIS — R109 Unspecified abdominal pain: Secondary | ICD-10-CM | POA: Diagnosis not present

## 2020-08-16 DIAGNOSIS — O210 Mild hyperemesis gravidarum: Secondary | ICD-10-CM | POA: Insufficient documentation

## 2020-08-16 DIAGNOSIS — Z20822 Contact with and (suspected) exposure to covid-19: Secondary | ICD-10-CM | POA: Insufficient documentation

## 2020-08-16 DIAGNOSIS — J45909 Unspecified asthma, uncomplicated: Secondary | ICD-10-CM | POA: Diagnosis not present

## 2020-08-16 LAB — URINALYSIS, ROUTINE W REFLEX MICROSCOPIC
Bilirubin Urine: NEGATIVE
Glucose, UA: NEGATIVE mg/dL
Ketones, ur: NEGATIVE mg/dL
Nitrite: NEGATIVE
Protein, ur: NEGATIVE mg/dL
Specific Gravity, Urine: 1.017 (ref 1.005–1.030)
pH: 8 (ref 5.0–8.0)

## 2020-08-16 LAB — SARS CORONAVIRUS 2 BY RT PCR (HOSPITAL ORDER, PERFORMED IN ~~LOC~~ HOSPITAL LAB): SARS Coronavirus 2: NEGATIVE

## 2020-08-16 MED ORDER — PROMETHAZINE HCL 25 MG/ML IJ SOLN
25.0000 mg | Freq: Once | INTRAMUSCULAR | Status: AC
  Administered 2020-08-16: 25 mg via INTRAMUSCULAR
  Filled 2020-08-16: qty 1

## 2020-08-16 MED ORDER — SCOPOLAMINE 1 MG/3DAYS TD PT72: 1.0000 | MEDICATED_PATCH | TRANSDERMAL | 0 refills | Status: AC

## 2020-08-16 NOTE — MAU Note (Addendum)
For the past 3 days has been having a bad cramp on her left side (lateral).  Past 2 days has been throwing up, can't keep anything down, has now been getting the shakes(none noted in triage) and headaches  because she hasn't eaten anything. Has a stabbing pain when she throws, pain is in the same area.

## 2020-08-16 NOTE — Discharge Instructions (Signed)

## 2020-08-16 NOTE — MAU Provider Note (Signed)
Chief Complaint: Morning Sickness and Abdominal Pain   First Provider Initiated Contact with Patient 08/16/20 2015     SUBJECTIVE HPI: Nichole Bush is a 24 y.o. G2P0101 at [redacted]w[redacted]d who presents to Maternity Admissions reporting nausea/vomiting, & side pain.  Symptoms started 4 days ago. States she's vomiting up all of her food & liquids. Has been taking phenergan but has only made her sleepy. No longer able to keep down phenergan. Reports that when she vomits, her left side hurts. Sometimes has the shakes after she vomits & more recently has had a headache. Denies lower abdominal pain, vaginal bleeding, fever, dysuria, diarrhea, sore throat. Reports occasional dry cough that she associates with her asthma.   Location: left side Quality: sharp, dull Severity: 7/10 on pain scale Duration: 3 days Timing: intermittent Modifying factors: occurs when vomiting Associated signs and symptoms: n/v  Past Medical History:  Diagnosis Date  . Constipation   . Gestational diabetes   . 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 Hypertension   . Migraine   . Thyroid disease    OB History  Gravida Para Term Preterm AB Living  2 1   1   1   SAB TAB Ectopic Multiple Live Births        0 1    # Outcome Date GA Lbr Len/2nd Weight Sex Delivery Anes PTL Lv  2 Current           1 Preterm 04/10/19 [redacted]w[redacted]d 04:45 / 02:46 2470 g M CS-LTranv EPI  LIV     Birth Comments: wnl   Past Surgical History:  Procedure Laterality Date  . CESAREAN SECTION N/A 04/10/2019   Procedure: CESAREAN SECTION;  Surgeon: 06/10/2019, MD;  Location: [facility];  Service: Obstetrics;  Laterality: N/A;  . ESOPHAGOGASTRODUODENOSCOPY    . WISDOM TOOTH EXTRACTION     Social History   Socioeconomic History  . Marital status: Single    Spouse name: Not on file  . Number of children: Not on file  . Years of education: HS  . Highest education level: Not on file  Occupational History  . Occupation: [employer]  Tobacco Use  . Smoking  status: Never Smoker  . Smokeless tobacco: Never Used  Vaping Use  . Vaping Use: Former  Substance and Sexual Activity  . Alcohol use: No    Alcohol/week: 0.0 standard drinks  . Drug use: No  . Sexual activity: Yes    Birth control/protection: None  Other Topics Concern  . Not on file  Social History Narrative   Lives at home w/ her mom   Left-handed   Caffeine: occasional tea, has stopped drinking sodas x2 months (10/30/2016)   Social Determinants of Health   Financial Resource Strain:   . Difficulty of Paying Living Expenses: Not on file  Food Insecurity:   . Worried About 11/01/2016 in the Last Year: Not on file  . Ran Out of Food in the Last Year: Not on file  Transportation Needs:   . Lack of Transportation (Medical): Not on file  . Lack of Transportation (Non-Medical): Not on file  Physical Activity:   . Days of Exercise per Week: Not on file  . Minutes of Exercise per Session: Not on file  Stress:   . Feeling of Stress : Not on file  Social Connections:   . Frequency of Communication with Friends and Family: Not on file  . Frequency of Social Gatherings with Friends and Family: Not  on file  . Attends Religious Services: Not on file  . Active Member of Clubs or Organizations: Not on file  . Attends Banker Meetings: Not on file  . Marital Status: Not on file  Intimate Partner Violence:   . Fear of Current or Ex-Partner: Not on file  . Emotionally Abused: Not on file  . Physically Abused: Not on file  . Sexually Abused: Not on file   Family History  Problem Relation Age of Onset  . Migraines Father   . Hypertension Father    No current facility-administered medications on file prior to encounter.   Current Outpatient Medications on File Prior to Encounter  Medication Sig Dispense Refill  . ipratropium-albuterol (DUONEB) 0.5-2.5 (3) MG/3ML SOLN Inhale into the lungs.    . promethazine (PHENERGAN) 25 MG tablet Take 1 tablet (25 mg total)  by mouth every 6 (six) hours as needed for nausea or vomiting. 60 tablet 2   Allergies  Allergen Reactions  . Peanut-Containing Drug Products Anaphylaxis  . Penicillins Anaphylaxis, Hives and Other (See Comments)    Has patient had a PCN reaction causing immediate rash, facial/tongue/throat swelling, SOB or lightheadedness with hypotension: Yes Has patient had a PCN reaction causing severe rash involving mucus membranes or skin necrosis: No Has patient had a PCN reaction that required hospitalization No Has patient had a PCN reaction occurring within the last 10 years: No If all of the above answers are "NO", then may proceed with Cephalosporin use.   Marland Kitchen Peanut Oil Hives    I have reviewed patient's Past Medical Hx, Surgical Hx, Family Hx, Social Hx, medications and allergies.   Review of Systems  Constitutional: Negative.   HENT: Negative.   Respiratory: Positive for cough. Negative for chest tightness and wheezing.   Cardiovascular: Negative for chest pain.  Gastrointestinal: Positive for abdominal pain, constipation, nausea and vomiting. Negative for diarrhea.  Genitourinary: Negative.   Neurological: Positive for headaches.    OBJECTIVE Patient Vitals for the past 24 hrs:  BP Temp Temp src Pulse Resp SpO2 Height Weight  08/16/20 2232 126/82 98.4 F (36.9 C) Oral 90 18 99 % -- --  08/16/20 2207 113/75 -- -- 84 -- -- -- --  08/16/20 2206 -- 98.4 F (36.9 C) Oral -- 18 100 % -- --  08/16/20 2006 125/64 98.6 F (37 C) Oral 82 17 100 % -- --  08/16/20 1711 129/75 98.4 F (36.9 C) Oral 71 16 99 % 5\' 2"  (1.575 m) 105.3 kg   Constitutional: Well-developed, well-nourished female in no acute distress.  Cardiovascular: normal rate & rhythm, no murmur Respiratory: normal rate and effort. Lung sounds clear throughout GI: No CVA tenderness. Abd soft, non-tender, Pos BS x 4. No guarding or rebound tenderness MS: Extremities nontender, no edema, normal ROM Neurologic: Alert and  oriented x 4.     LAB RESULTS Results for orders placed or performed during the hospital encounter of 08/16/20 (from the past 24 hour(s))  Urinalysis, Routine w reflex microscopic Urine, Clean Catch     Status: Abnormal   Collection Time: 08/16/20  5:13 PM  Result Value Ref Range   Color, Urine YELLOW YELLOW   APPearance CLOUDY (A) CLEAR   Specific Gravity, Urine 1.017 1.005 - 1.030   pH 8.0 5.0 - 8.0   Glucose, UA NEGATIVE NEGATIVE mg/dL   Hgb urine dipstick SMALL (A) NEGATIVE   Bilirubin Urine NEGATIVE NEGATIVE   Ketones, ur NEGATIVE NEGATIVE mg/dL   Protein, ur NEGATIVE  NEGATIVE mg/dL   Nitrite NEGATIVE NEGATIVE   Leukocytes,Ua LARGE (A) NEGATIVE   RBC / HPF 0-5 0 - 5 RBC/hpf   WBC, UA 0-5 0 - 5 WBC/hpf   Bacteria, UA RARE (A) NONE SEEN   Squamous Epithelial / LPF 0-5 0 - 5  SARS Coronavirus 2 by RT PCR (hospital order, performed in [facility] hospital lab) Nasopharyngeal Nasopharyngeal Swab     Status: None   Collection Time: 08/16/20  7:23 PM   Specimen: Nasopharyngeal Swab  Result Value Ref Range   SARS Coronavirus 2 NEGATIVE NEGATIVE    IMAGING No results found.  MAU COURSE Orders Placed This Encounter  Procedures  . SARS Coronavirus 2 by RT PCR (hospital order, performed in [facility] hospital lab) Nasopharyngeal Nasopharyngeal Swab  . Culture, OB Urine  . Urinalysis, Routine w reflex microscopic Urine, Clean Catch  . Airborne and Contact precautions  . Discharge patient   Meds ordered this encounter  Medications  . promethazine (PHENERGAN) injection 25 mg  . scopolamine (TRANSDERM-SCOP, 1.5 MG,) 1 MG/3DAYS    Sig: Place 1 patch (1.5 mg total) onto the skin every 3 (three) days for 12 days.    Dispense:  4 patch    Refill:  0    Order Specific Question:   Supervising Provider    Answer:   Matthews Bing [[id number]]    MDM FHT present via doppler Patient not actively vomiting in MAU. U/a & vital signs do not indicate dehydration at this time. Will try  IM phenergan & reassess.   IM phenergan given. Patient tolerating oral fluids & requesting to be discharged home. Discussed using phenergan vaginally or rectally & taking on a schedule. Patient also interested in scop patch; rx sent in.   ASSESSMENT 1. Nausea and vomiting during pregnancy prior to [redacted] weeks gestation   2. [redacted] weeks gestation of pregnancy     PLAN Discharge home in stable condition. Rx scopolamine Keep ob appointment Discussed reasons to return to MAU   Allergies as of 08/16/2020      Reactions   Peanut-containing Drug Products Anaphylaxis   Penicillins Anaphylaxis, Hives, Other (See Comments)   Has patient had a PCN reaction causing immediate rash, facial/tongue/throat swelling, SOB or lightheadedness with hypotension: Yes Has patient had a PCN reaction causing severe rash involving mucus membranes or skin necrosis: No Has patient had a PCN reaction that required hospitalization No Has patient had a PCN reaction occurring within the last 10 years: No If all of the above answers are "NO", then may proceed with Cephalosporin use.   Peanut Oil Hives      Medication List    TAKE these medications   ipratropium-albuterol 0.5-2.5 (3) MG/3ML Soln Commonly known as: DUONEB Inhale into the lungs.   promethazine 25 MG tablet Commonly known as: PHENERGAN Take 1 tablet (25 mg total) by mouth every 6 (six) hours as needed for nausea or vomiting.   scopolamine 1 MG/3DAYS Commonly known as: Transderm-Scop (1.5 MG) Place 1 patch (1.5 mg total) onto the skin every 3 (three) days for 12 days.        Judeth Horn, NP 08/17/2020  12:49 AM

## 2020-08-17 LAB — CULTURE, OB URINE

## 2020-08-24 ENCOUNTER — Ambulatory Visit (INDEPENDENT_AMBULATORY_CARE_PROVIDER_SITE_OTHER): Payer: Medicaid Other | Admitting: Advanced Practice Midwife

## 2020-08-24 ENCOUNTER — Other Ambulatory Visit: Payer: Self-pay

## 2020-08-24 ENCOUNTER — Encounter: Payer: Self-pay | Admitting: Advanced Practice Midwife

## 2020-08-24 VITALS — BP 120/84 | HR 83 | Wt 233.0 lb

## 2020-08-24 DIAGNOSIS — E038 Other specified hypothyroidism: Secondary | ICD-10-CM

## 2020-08-24 DIAGNOSIS — Z3480 Encounter for supervision of other normal pregnancy, unspecified trimester: Secondary | ICD-10-CM

## 2020-08-24 DIAGNOSIS — Z3A12 12 weeks gestation of pregnancy: Secondary | ICD-10-CM

## 2020-08-24 DIAGNOSIS — O219 Vomiting of pregnancy, unspecified: Secondary | ICD-10-CM

## 2020-08-24 DIAGNOSIS — E039 Hypothyroidism, unspecified: Secondary | ICD-10-CM

## 2020-08-24 DIAGNOSIS — J4530 Mild persistent asthma, uncomplicated: Secondary | ICD-10-CM

## 2020-08-24 MED ORDER — FLOVENT HFA 110 MCG/ACT IN AERO: 2.0000 | INHALATION_SPRAY | Freq: Every day | RESPIRATORY_TRACT | 12 refills | Status: AC

## 2020-08-24 NOTE — Progress Notes (Signed)
   PRENATAL VISIT NOTE  Subjective:  Nichole Bush is a 24 y.o. G2P0101 at [redacted]w[redacted]d being seen today for ongoing prenatal care.  She is currently monitored for the following issues for this high-risk pregnancy and has Henoch-Schonlein purpura (HCC); Asthma; Gastroesophageal reflux disease with esophagitis; Subclinical hypothyroidism; Vitamin D deficiency; Supervision of other normal pregnancy, antepartum; History of pre-eclampsia in prior pregnancy, currently pregnant; and History of cesarean delivery on their problem list.  Patient reports Persistent asthma symptoms (inhaler used 5x/wk), still nauseated despite Phenergan.Pharmacy was out of scop patches, Discussed she can try them again to get it filled.   Contractions: Not present. Vag. Bleeding: None.   . Denies leaking of fluid.   The following portions of the patient's history were reviewed and updated as appropriate: allergies, current medications, past family history, past medical history, past social history, past surgical history and problem list.   Objective:   Vitals:   08/24/20 0823  BP: 120/84  Pulse: 83  Weight: 233 lb (105.7 kg)    Fetal Status: Fetal Heart Rate (bpm): 140 Fundal Height: 12 cm       General:  Alert, oriented and cooperative. Patient is in no acute distress.  Skin: Skin is warm and dry. No rash noted.   Cardiovascular: Normal heart rate noted  Respiratory: Normal respiratory effort, no problems with respiration noted  Abdomen: Soft, gravid, appropriate for gestational age.  Pain/Pressure: Present     Pelvic: Cervical exam deferred        Extremities: Normal range of motion.  Edema: None  Mental Status: Normal mood and affect. Normal behavior. Normal judgment and thought content.   Assessment and Plan:  Pregnancy: G2P0101 at [redacted]w[redacted]d 1. [redacted] weeks gestation of pregnancy       - Genetic Screening - TSH - T3, free - T4, free  2. Supervision of other normal pregnancy, antepartum  - Genetic Screening -  TSH - T3, free - T4, free  3. Nausea and vomiting during pregnancy prior to [redacted] weeks gestation     Will add Zofran for prn use      Advised to fill Rx for scop patches  4. Subclinical hypothyroidism     Labs drawn today  5. Mild persistent asthma without complication    Consult Dr Adrian Blackwater     Will start on Flovent and reevaluate  Preterm labor symptoms and general obstetric precautions including but not limited to vaginal bleeding, contractions, leaking of fluid and fetal movement were reviewed in detail with the patient. Please refer to After Visit Summary for other counseling recommendations.   Return in about 4 weeks (around 09/21/2020) for [employer].  Future Appointments  Date Time Provider Department Center  09/21/2020  9:10 AM Aviva Signs, CNM [facility] None    Wynelle Bourgeois, CNM

## 2020-08-24 NOTE — Patient Instructions (Signed)
Genetic Testing During Pregnancy Genetic testing during pregnancy is also called prenatal genetic testing. This type of testing can determine if your baby is at risk of being born with a disorder caused by abnormal genes or chromosomes (genetic disorder). Chromosomes contain genes that control how your baby will develop in your womb. There are many different genetic disorders. Examples of genetic disorders that may be found through genetic testing include Down syndrome and cystic fibrosis. Gene changes (mutations) can be passed down through families. Genetic testing is offered to all women before or during pregnancy. You can choose whether to have genetic testing. Why is genetic testing done? Genetic testing is done during pregnancy to find out whether your child is at risk for a genetic disorder. Having genetic testing allows you to:  Discuss your test results and options with a genetic counselor.  Prepare for a baby that may be born with a genetic disorder. Learning about the disorder ahead of time helps you be better prepared to manage it. Your health care providers can also be prepared in case your baby requires special care before or after birth.  Consider whether you want to continue with the pregnancy. In some cases, genetic testing may be done to learn about the traits a child will inherit. Types of genetic tests There are two basic types of genetic testing. Screening tests indicate whether your developing baby (fetus) is at higher risk for a genetic disorder. Diagnostic tests check actual fetal cells to diagnose a genetic disorder. Screening tests     Screening tests will not harm your baby. They are recommended for all pregnant women. Types of screening tests include:  Carrier screening. This test involves checking genes from both parents by testing their blood or saliva. The test checks to find out if the parents carry a genetic mutation that may be passed to a baby. In most cases,  both parents must carry the mutation for a baby to be at risk.  First trimester screening. This test combines a blood test with sound wave imaging of your baby (fetal ultrasound). This screening test checks for a risk of Down syndrome or other defects caused by having extra chromosomes. It also checks for defects of the heart, abdomen, or skeleton.  Second trimester screening also combines a blood test with a fetal ultrasound exam. It checks for a risk of genetic defects of the face, brain, spine, heart, or limbs.  Combined or sequential screening. This type of testing combines the results of first and second trimester screening. This type of testing may be more accurate than first or second trimester screening alone.  Cell-free DNA testing. This is a blood test that detects cells released by the placenta that get into the mother's blood. It can be used to check for a risk of Down syndrome, other extra chromosome syndromes, and disorders caused by abnormal numbers of sex chromosomes. This test can be done any time after 10 weeks of pregnancy.  Diagnostic tests Diagnostic tests carry slight risks of problems, including bleeding, infection, and loss of the pregnancy. These tests are done only if your baby is at risk for a genetic disorder. You may meet with a genetic counselor to discuss the risks and benefits before having diagnostic tests. Examples of diagnostic tests include:  Chorionic villus sampling (CVS). This involves a procedure to remove and test a sample of cells taken from the placenta. The procedure may be done between 10 and 12 weeks of pregnancy.  Amniocentesis. This involves a   procedure to remove and test a sample of fluid (amniotic fluid) and cells from the sac that surrounds the developing baby. The procedure may be done between 15 and 20 weeks of pregnancy. What do the results mean? For a screening test:  If the results are negative, it often means that your child is not at higher  risk. There is still a slight chance your child could have a genetic disorder.  If the results are positive, it does not mean your child will have a genetic disorder. It may mean that your child has a higher-than-normal risk for a genetic disorder. In that case, you may want to talk with a genetic counselor about whether you should have diagnostic genetic tests. For a diagnostic test:  If the result is negative, it is unlikely that your child will have a genetic disorder.  If the test is positive for a genetic disorder, it is likely that your child will have the disorder. The test may not tell how severe the disorder will be. Talk with your health care provider about your options. Questions to ask your health care provider Before talking to your health care provider about genetic testing, find out if there is a history of genetic disorders in your family. It may also help to know your family's ethnic origins. Then ask your health care provider the following questions:  Is my baby at risk for a genetic disorder?  What are the benefits of having genetic screening?  What tests are best for me and my baby?  What are the risks of each test?  If I get a positive result on a screening test, what is the next step?  Should I meet with a genetic counselor before having a diagnostic test?  Should my partner or other members of my family be tested?  How much do the tests cost? Will my insurance cover the testing? Summary  Genetic testing is done during pregnancy to find out whether your child is at risk for a genetic disorder.  Genetic testing is offered to all women before or during pregnancy. You can choose whether to have genetic testing.  There are two basic types of genetic testing. Screening tests indicate whether your developing baby (fetus) is at higher risk for a genetic disorder. Diagnostic tests check actual fetal cells to diagnose a genetic disorder.  If a diagnostic genetic test is  positive, talk with your health care provider about your options. This information is not intended to replace advice given to you by your health care provider. Make sure you discuss any questions you have with your health care provider. Document Revised: 03/13/2019 Document Reviewed: 02/04/2018 Elsevier Patient Education  2020 Elsevier Inc.  

## 2020-08-24 NOTE — Progress Notes (Signed)
Patient states that she is taking phenergan and not seeing much improvement in her nausea. Armandina Stammer RN

## 2020-08-25 LAB — T4, FREE: Free T4: 1.13 ng/dL (ref 0.82–1.77)

## 2020-08-25 LAB — T3, FREE: T3, Free: 4.4 pg/mL (ref 2.0–4.4)

## 2020-08-25 LAB — TSH: TSH: 5.43 u[IU]/mL — ABNORMAL HIGH (ref 0.450–4.500)

## 2020-08-31 ENCOUNTER — Encounter: Payer: Self-pay | Admitting: Advanced Practice Midwife

## 2020-08-31 ENCOUNTER — Other Ambulatory Visit: Payer: Self-pay | Admitting: Advanced Practice Midwife

## 2020-08-31 MED ORDER — LEVOTHYROXINE SODIUM 50 MCG PO TABS: 50.0000 ug | ORAL_TABLET | Freq: Every day | ORAL | 1 refills | Status: AC

## 2020-08-31 NOTE — Progress Notes (Signed)
TSH elevated to 5.438 Per Dr Adrian Blackwater, will start Synthroid daily Patient notified

## 2020-09-21 ENCOUNTER — Other Ambulatory Visit: Payer: Self-pay

## 2020-09-21 ENCOUNTER — Ambulatory Visit (INDEPENDENT_AMBULATORY_CARE_PROVIDER_SITE_OTHER): Payer: Medicaid Other | Admitting: Advanced Practice Midwife

## 2020-09-21 VITALS — BP 98/70 | HR 73 | Wt 230.0 lb

## 2020-09-21 DIAGNOSIS — J4530 Mild persistent asthma, uncomplicated: Secondary | ICD-10-CM

## 2020-09-21 DIAGNOSIS — O09299 Supervision of pregnancy with other poor reproductive or obstetric history, unspecified trimester: Secondary | ICD-10-CM

## 2020-09-21 DIAGNOSIS — Z3A16 16 weeks gestation of pregnancy: Secondary | ICD-10-CM

## 2020-09-21 DIAGNOSIS — Z3480 Encounter for supervision of other normal pregnancy, unspecified trimester: Secondary | ICD-10-CM

## 2020-09-21 DIAGNOSIS — E038 Other specified hypothyroidism: Secondary | ICD-10-CM

## 2020-09-21 DIAGNOSIS — Z98891 History of uterine scar from previous surgery: Secondary | ICD-10-CM

## 2020-09-21 MED ORDER — PANTOPRAZOLE SODIUM 20 MG PO TBEC: 20.0000 mg | DELAYED_RELEASE_TABLET | Freq: Every day | ORAL | 1 refills | Status: DC

## 2020-09-21 MED ORDER — AMBULATORY NON FORMULARY MEDICATION: 1.0000 | 0 refills | Status: AC

## 2020-09-21 MED ORDER — ONDANSETRON 4 MG PO TBDP: 4.0000 mg | ORAL_TABLET | Freq: Four times a day (QID) | ORAL | 2 refills | Status: DC | PRN

## 2020-09-21 NOTE — Progress Notes (Signed)
   PRENATAL VISIT NOTE  Subjective:  Nichole Bush is a 24 y.o. G2P0101 at [redacted]w[redacted]d being seen today for ongoing prenatal care.  She is currently monitored for the following issues for this high-risk pregnancy and has Henoch-Schonlein purpura (HCC); Asthma; Gastroesophageal reflux disease with esophagitis; Hypothyroidism; Vitamin D deficiency; Supervision of other normal pregnancy, antepartum; History of pre-eclampsia in prior pregnancy, currently pregnant; and History of cesarean delivery on their problem list.  Patient reports nausea and low appetitie, acid reflux.  Contractions: Not present. Vag. Bleeding: None.  Movement: Absent. Denies leaking of fluid.   Decided to do repeat C/S  Will schedule with MD  The following portions of the patient's history were reviewed and updated as appropriate: allergies, current medications, past family history, past medical history, past social history, past surgical history and problem list.   Objective:   Vitals:   09/21/20 0902  BP: 98/70  Pulse: 73  Weight: 230 lb (104.3 kg)    Fetal Status: Fetal Heart Rate (bpm): 154   Movement: Absent     General:  Alert, oriented and cooperative. Patient is in no acute distress.  Skin: Skin is warm and dry. No rash noted.   Cardiovascular: Normal heart rate noted  Respiratory: Normal respiratory effort, no problems with respiration noted  Abdomen: Soft, gravid, appropriate for gestational age.  Pain/Pressure: Present     Pelvic: Cervical exam deferred        Extremities: Normal range of motion.  Edema: None  Mental Status: Normal mood and affect. Normal behavior. Normal judgment and thought content.   Assessment and Plan:  Pregnancy: G2P0101 at [redacted]w[redacted]d 1. [redacted] weeks gestation of pregnancy Panorama normal female - AFP, Serum, Open Spina Bifida  2. Supervision of other normal pregnancy, antepartum Undecided re: contraception but definitely wants it  3. Subclinical hypothyroidism On synthroid, recheck  early november  4. Mild persistent asthma without complication No problems  5. History of pre-eclampsia in prior pregnancy, currently pregnant BP good today  6.   Nausea  Add Zofran and Protonix  Preterm labor symptoms and general obstetric precautions including but not limited to vaginal bleeding, contractions, leaking of fluid and fetal movement were reviewed in detail with the patient. Please refer to After Visit Summary for other counseling recommendations.    Wynelle Bourgeois, CNM

## 2020-09-23 LAB — AFP, SERUM, OPEN SPINA BIFIDA
AFP MoM: 2.38
AFP Value: 71.8 ng/mL
Gest. Age on Collection Date: 16.4 weeks
Maternal Age At EDD: 23.4 yr
OSBR Risk 1 IN: 726
Test Results:: NEGATIVE
Weight: 230 [lb_av]

## 2020-09-24 ENCOUNTER — Other Ambulatory Visit: Payer: Self-pay

## 2020-09-24 MED ORDER — PRENATE DHA 18-0.6-0.4-300 MG PO CAPS: 1.0000 | ORAL_CAPSULE | Freq: Every day | ORAL | 11 refills | Status: AC

## 2020-10-19 ENCOUNTER — Other Ambulatory Visit: Payer: Self-pay

## 2020-10-19 ENCOUNTER — Encounter: Payer: Self-pay | Admitting: Advanced Practice Midwife

## 2020-10-19 ENCOUNTER — Ambulatory Visit (INDEPENDENT_AMBULATORY_CARE_PROVIDER_SITE_OTHER): Payer: Medicaid Other | Admitting: Advanced Practice Midwife

## 2020-10-19 VITALS — BP 102/72 | HR 89 | Wt 233.0 lb

## 2020-10-19 DIAGNOSIS — Z3A20 20 weeks gestation of pregnancy: Secondary | ICD-10-CM

## 2020-10-19 DIAGNOSIS — O26892 Other specified pregnancy related conditions, second trimester: Secondary | ICD-10-CM

## 2020-10-19 DIAGNOSIS — E039 Hypothyroidism, unspecified: Secondary | ICD-10-CM

## 2020-10-19 DIAGNOSIS — Z3480 Encounter for supervision of other normal pregnancy, unspecified trimester: Secondary | ICD-10-CM

## 2020-10-19 DIAGNOSIS — R519 Headache, unspecified: Secondary | ICD-10-CM

## 2020-10-19 MED ORDER — COMFORT FIT MATERNITY SUPP MED MISC: 1.0000 | Freq: Every day | 1 refills | Status: DC

## 2020-10-19 MED ORDER — BUTALBITAL-APAP-CAFFEINE 50-325-40 MG PO CAPS
1.0000 | ORAL_CAPSULE | Freq: Four times a day (QID) | ORAL | 0 refills | Status: DC | PRN
Start: 2020-10-19 — End: 2020-12-26

## 2020-10-19 NOTE — Progress Notes (Signed)
Patient complaining of headaches. Patient states she has hx of migraines.  Patient states she was moving this weekend and is having burning/ pain sensation in her abdomen after lifting something heavy. Armandina Stammer RN

## 2020-10-19 NOTE — Progress Notes (Signed)
   PRENATAL VISIT NOTE  Subjective:  Nichole Bush is a 24 y.o. G2P0101 at [redacted]w[redacted]d being seen today for ongoing prenatal care.  She is currently monitored for the following issues for this high-risk pregnancy and has Henoch-Schonlein purpura (HCC); Asthma; Gastroesophageal reflux disease with esophagitis; Hypothyroidism; Vitamin D deficiency; Supervision of other normal pregnancy, antepartum; History of pre-eclampsia in prior pregnancy, currently pregnant; and History of cesarean delivery on their problem list.  Patient reports increased edema, intermittent migraine-like headaches, pulling of muscles on left side.  Contractions: Not present. Vag. Bleeding: None.  Movement: Present. Denies leaking of fluid.   The following portions of the patient's history were reviewed and updated as appropriate: allergies, current medications, past family history, past medical history, past social history, past surgical history and problem list.   Objective:   Vitals:   10/19/20 0843  BP: 102/72  Pulse: 89  Weight: 233 lb (105.7 kg)    Fetal Status: Fetal Heart Rate (bpm): 142 Fundal Height: 20 cm Movement: Present     General:  Alert, oriented and cooperative. Patient is in no acute distress.  Skin: Skin is warm and dry. No rash noted.   Cardiovascular: Normal heart rate noted  Respiratory: Normal respiratory effort, no problems with respiration noted  Abdomen: Soft, gravid, appropriate for gestational age.  Pain/Pressure: Present     Pelvic: Cervical exam deferred        Extremities: Normal range of motion.  Trace to 1+ edema of feet and hands  Mental Status: Normal mood and affect. Normal behavior. Normal judgment and thought content.   Assessment and Plan:  Pregnancy: G2P0101 at [redacted]w[redacted]d 1. Supervision of other normal pregnancy, antepartum     Concerned that edema and headaches are signs of repeated preeclampsia     Will watch BPs.  Reassured that preeclampsia does not start this early but we will  watch closely - TSH - [place] [facility] OB DETAIL +14 WK; Future  2. [redacted] weeks gestation of pregnancy     Will order pregnancy support belt  - [place] [facility] OB DETAIL +14 WK; Future  3. Pregnancy headache in second trimester     Rx Fioricet for headaches  4. Hypothyroidism, unspecified type      Labs ordered - TSH - T3 - T4, free - [place] [facility] OB DETAIL +14 WK; Future  Preterm labor symptoms and general obstetric precautions including but not limited to vaginal bleeding, contractions, leaking of fluid and fetal movement were reviewed in detail with the patient. Please refer to After Visit Summary for other counseling recommendations.   Return in about 4 weeks (around 11/16/2020), or With MD discuss C/S, for [facility].  Future Appointments  Date Time Provider Department Center  11/05/2020 12:45 PM [facility] NURSE [facility]  11/05/2020  1:00 PM [facility] US1 [facility]  11/17/2020  3:30 PM Levie Heritage, DO [facility] None    Wynelle Bourgeois, CNM

## 2020-10-19 NOTE — Patient Instructions (Signed)

## 2020-10-20 LAB — TSH: TSH: 2.58 u[IU]/mL (ref 0.450–4.500)

## 2020-10-26 ENCOUNTER — Encounter: Payer: Medicaid Other | Admitting: Advanced Practice Midwife

## 2020-11-05 ENCOUNTER — Ambulatory Visit: Payer: Medicaid Other | Admitting: *Deleted

## 2020-11-05 ENCOUNTER — Other Ambulatory Visit: Payer: Self-pay | Admitting: *Deleted

## 2020-11-05 ENCOUNTER — Other Ambulatory Visit: Payer: Self-pay

## 2020-11-05 ENCOUNTER — Encounter: Payer: Self-pay | Admitting: *Deleted

## 2020-11-05 ENCOUNTER — Ambulatory Visit: Payer: Medicaid Other | Attending: Advanced Practice Midwife

## 2020-11-05 DIAGNOSIS — Z3A20 20 weeks gestation of pregnancy: Secondary | ICD-10-CM | POA: Diagnosis not present

## 2020-11-05 DIAGNOSIS — E039 Hypothyroidism, unspecified: Secondary | ICD-10-CM

## 2020-11-05 DIAGNOSIS — Z3480 Encounter for supervision of other normal pregnancy, unspecified trimester: Secondary | ICD-10-CM | POA: Insufficient documentation

## 2020-11-05 NOTE — Progress Notes (Signed)
C/o" spotting 2 days ago, mild cramping, and constant soreness in left lower abd"

## 2020-11-07 ENCOUNTER — Encounter: Payer: Self-pay | Admitting: Advanced Practice Midwife

## 2020-11-07 DIAGNOSIS — O283 Abnormal ultrasonic finding on antenatal screening of mother: Secondary | ICD-10-CM | POA: Insufficient documentation

## 2020-11-09 ENCOUNTER — Other Ambulatory Visit: Payer: Self-pay

## 2020-11-09 ENCOUNTER — Inpatient Hospital Stay (HOSPITAL_COMMUNITY)
Admission: AD | Admit: 2020-11-09 | Discharge: 2020-11-09 | Disposition: A | Discharge status: Home / Self Care | Payer: Medicaid Other | Attending: Obstetrics & Gynecology | Admitting: Obstetrics & Gynecology

## 2020-11-09 ENCOUNTER — Encounter (HOSPITAL_COMMUNITY): Payer: Self-pay | Admitting: Obstetrics & Gynecology

## 2020-11-09 ENCOUNTER — Encounter: Payer: Medicaid Other | Admitting: Advanced Practice Midwife

## 2020-11-09 DIAGNOSIS — O4692 Antepartum hemorrhage, unspecified, second trimester: Secondary | ICD-10-CM | POA: Insufficient documentation

## 2020-11-09 DIAGNOSIS — R102 Pelvic and perineal pain: Secondary | ICD-10-CM | POA: Insufficient documentation

## 2020-11-09 DIAGNOSIS — Y999 Unspecified external cause status: Secondary | ICD-10-CM | POA: Diagnosis not present

## 2020-11-09 DIAGNOSIS — Y939 Activity, unspecified: Secondary | ICD-10-CM | POA: Insufficient documentation

## 2020-11-09 DIAGNOSIS — O26892 Other specified pregnancy related conditions, second trimester: Secondary | ICD-10-CM | POA: Insufficient documentation

## 2020-11-09 DIAGNOSIS — Y929 Unspecified place or not applicable: Secondary | ICD-10-CM | POA: Insufficient documentation

## 2020-11-09 DIAGNOSIS — Z3480 Encounter for supervision of other normal pregnancy, unspecified trimester: Secondary | ICD-10-CM

## 2020-11-09 DIAGNOSIS — O283 Abnormal ultrasonic finding on antenatal screening of mother: Secondary | ICD-10-CM | POA: Diagnosis not present

## 2020-11-09 DIAGNOSIS — T148XXA Other injury of unspecified body region, initial encounter: Secondary | ICD-10-CM | POA: Diagnosis not present

## 2020-11-09 DIAGNOSIS — Z3A23 23 weeks gestation of pregnancy: Secondary | ICD-10-CM | POA: Insufficient documentation

## 2020-11-09 DIAGNOSIS — Z79899 Other long term (current) drug therapy: Secondary | ICD-10-CM | POA: Insufficient documentation

## 2020-11-09 DIAGNOSIS — R109 Unspecified abdominal pain: Secondary | ICD-10-CM | POA: Diagnosis not present

## 2020-11-09 DIAGNOSIS — O99891 Other specified diseases and conditions complicating pregnancy: Secondary | ICD-10-CM | POA: Diagnosis not present

## 2020-11-09 DIAGNOSIS — S39011A Strain of muscle, fascia and tendon of abdomen, initial encounter: Secondary | ICD-10-CM

## 2020-11-09 LAB — URINALYSIS, ROUTINE W REFLEX MICROSCOPIC
Bilirubin Urine: NEGATIVE
Glucose, UA: NEGATIVE mg/dL
Hgb urine dipstick: NEGATIVE
Ketones, ur: NEGATIVE mg/dL
Leukocytes,Ua: NEGATIVE
Nitrite: NEGATIVE
Protein, ur: NEGATIVE mg/dL
Specific Gravity, Urine: 1.014 (ref 1.005–1.030)
pH: 6 (ref 5.0–8.0)

## 2020-11-09 MED ORDER — CYCLOBENZAPRINE HCL 10 MG PO TABS: 10.0000 mg | ORAL_TABLET | Freq: Three times a day (TID) | ORAL | 1 refills | Status: DC | PRN

## 2020-11-09 MED ORDER — CYCLOBENZAPRINE HCL 5 MG PO TABS
10.0000 mg | ORAL_TABLET | ORAL | Status: AC
  Administered 2020-11-09: 10 mg via ORAL
  Filled 2020-11-09: qty 2

## 2020-11-09 NOTE — MAU Note (Signed)
Pt reports she pulled a muscle in her stomach about two weeks ago and has had cramping since then. States today the pain became very sharp and has worsened over the day. Spotting x 2 days. Reports she has not felt the baby move today.

## 2020-11-09 NOTE — Discharge Instructions (Signed)
Abdominal Pain During Pregnancy  Belly (abdominal) pain is common during pregnancy. There are many possible causes. Most of the time, it is not a serious problem. Other times, it can be a sign that something is wrong with the pregnancy. Always tell your doctor if you have belly pain. Follow these instructions at home:  Do not have sex or put anything in your vagina until your pain goes away completely.  Get plenty of rest until your pain gets better.  Drink enough fluid to keep your pee (urine) pale yellow.  Take over-the-counter and prescription medicines only as told by your doctor.  Keep all follow-up visits as told by your doctor. This is important. Contact a doctor if:  Your pain continues or gets worse after resting.  You have lower belly pain that: ? Comes and goes at regular times. ? Spreads to your back. ? Feels like menstrual cramps.  You have pain or burning when you pee (urinate). Get help right away if:  You have a fever or chills.  You have vaginal bleeding.  You are leaking fluid from your vagina.  You are passing tissue from your vagina.  You throw up (vomit) for more than 24 hours.  You have watery poop (diarrhea) for more than 24 hours.  Your baby is moving less than usual.  You feel very weak or faint.  You have shortness of breath.  You have very bad pain in your upper belly. Summary  Belly (abdominal) pain is common during pregnancy. There are many possible causes.  If you have belly pain during pregnancy, tell your doctor right away.  Keep all follow-up visits as told by your doctor. This is important. This information is not intended to replace advice given to you by your health care provider. Make sure you discuss any questions you have with your health care provider. Document Revised: 03/10/2019 Document Reviewed: 02/22/2017 Elsevier Patient Education  2020 Elsevier Inc.  

## 2020-11-09 NOTE — MAU Provider Note (Signed)
History     CSN: [id number]  Arrival date and time: 11/09/20 2113   None     Chief Complaint  Patient presents with  . Abdominal Pain  . Vaginal Bleeding   HPI  24 y/o G2p0101 at 43w4dpresenting for sharp pain and cramping in stomach. Patient has a hx of CS and history of PEC w SF.   This morning woke up with sharp abdominal cramps. Reports them as lower pelvic cramps that radiate up abdomen. They come and go, not aggravated by anything and not relieved by anything. Left side worse than right. Pulled muscle on left side of abdomen two weeks ago and this still hurts. Has been using tylenol and abdominal binder without relief.    No fevers or chills. Reports chronic constipation, has been stable. Last BM was two days. Takes miralax. No pain with urination. No vomiting or diarrhea. Endorses some low back pain. Reports tolerating PO. Hasn't had any unusual foods. No recent intercourse. No vaginal discharge.   Reports having light brown/red spots when she wiped two days ago. Just on toilet paper did not go into toilet bowl. Not happening anymore.   Feeling baby move. No contractions, no leakage of fluid.    OB History    Gravida  2   Para  1   Term      Preterm  1   AB      Living  1     SAB      TAB      Ectopic      Multiple  0   Live Births  1           Past Medical History:  Diagnosis Date  . Constipation   . Gestational diabetes   . 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 Hypertension   . Migraine   . Thyroid disease     Past Surgical History:  Procedure Laterality Date  . CESAREAN SECTION N/A 04/10/2019   Procedure: CESAREAN SECTION;  Surgeon: PDonnamae Jude MD;  Location: [facility] LD ORS;  Service: Obstetrics;  Laterality: N/A;  . ESOPHAGOGASTRODUODENOSCOPY    . WISDOM TOOTH EXTRACTION      Family History  Problem Relation Age of Onset  . Migraines Father   . Hypertension Father     Social History   Tobacco Use  . Smoking status: Never  Smoker  . Smokeless tobacco: Never Used  Vaping Use  . Vaping Use: Former  Substance Use Topics  . Alcohol use: No    Alcohol/week: 0.0 standard drinks  . Drug use: No    Allergies:  Allergies  Allergen Reactions  . Peanut-Containing Drug Products Anaphylaxis  . Penicillins Anaphylaxis, Hives and Other (See Comments)    Has patient had a PCN reaction causing immediate rash, facial/tongue/throat swelling, SOB or lightheadedness with hypotension: Yes Has patient had a PCN reaction causing severe rash involving mucus membranes or skin necrosis: No Has patient had a PCN reaction that required hospitalization No Has patient had a PCN reaction occurring within the last 10 years: No If all of the above answers are "NO", then may proceed with Cephalosporin use.   .Marland KitchenPeanut Oil Hives    Medications Prior to Admission  Medication Sig Dispense Refill Last Dose  . aspirin EC 81 MG tablet Take 81 mg by mouth every other day. Swallow whole.   11/08/2020 at Unknown time  . Butalbital-APAP-Caffeine 50-325-40 MG capsule Take 1-2 capsules by mouth every 6 (six)  hours as needed for headache. 30 capsule 0 Past Month at Unknown time  . levothyroxine (SYNTHROID) 50 MCG tablet Take 1 tablet (50 mcg total) by mouth daily before breakfast. 30 tablet 1 11/09/2020 at Unknown time  . Prenat-FeAsp-Meth-FA-DHA w/o A (PRENATE DHA) 18-0.6-0.4-300 MG CAPS Take 1 capsule by mouth daily. 30 capsule 11 11/09/2020 at Unknown time  . promethazine (PHENERGAN) 25 MG tablet Take 1 tablet (25 mg total) by mouth every 6 (six) hours as needed for nausea or vomiting. 60 tablet 2 Past Month at Unknown time  . AMBULATORY NON FORMULARY MEDICATION 1 Device by Other route once a week. [place] pressure cuff /Large  Monitored Regularly at home  ICD 10: HROB O09.90 1 kit 0   . Elastic Bandages & Supports (COMFORT FIT MATERNITY SUPP MED) MISC 1 Device by Does not apply route daily. 1 each 1   . fluticasone (FLOVENT HFA) 110 MCG/ACT inhaler  Inhale 2 puffs into the lungs daily. 1 each 12 Unknown at Unknown time  . ipratropium-albuterol (DUONEB) 0.5-2.5 (3) MG/3ML SOLN Inhale into the lungs.   Unknown at Unknown time  . ondansetron (ZOFRAN ODT) 4 MG disintegrating tablet Take 1 tablet (4 mg total) by mouth every 6 (six) hours as needed for nausea. (Patient not taking: Reported on 11/05/2020) 20 tablet 2   . pantoprazole (PROTONIX) 20 MG tablet Take 1 tablet (20 mg total) by mouth daily. 30 tablet 1 Unknown at Unknown time  . polyethylene glycol powder (MIRALAX) 17 GM/SCOOP powder Take by mouth. (Patient not taking: Reported on 11/05/2020)     . promethazine (PHENERGAN) 12.5 MG tablet Take by mouth.       Review of Systems  Constitutional: Negative for activity change, appetite change, chills, fatigue and fever.  Respiratory: Negative for shortness of breath.   Gastrointestinal: Positive for constipation. Negative for diarrhea, nausea and vomiting.  Genitourinary: Positive for pelvic pain. Negative for flank pain, frequency, hematuria, urgency, vaginal bleeding, vaginal discharge and vaginal pain.  Musculoskeletal: Positive for back pain.   Physical Exam   Blood pressure 133/79, pulse 95, temperature 98.4 F (36.9 C), resp. rate 17, height 5' 2" (1.575 m), weight 106.1 kg, last menstrual period 05/28/2020, SpO2 98 %, not currently breastfeeding.  Physical Exam Vitals and nursing note reviewed.  Constitutional:      Appearance: She is well-developed.  Cardiovascular:     Rate and Rhythm: Normal rate.  Pulmonary:     Effort: Pulmonary effort is normal.  Abdominal:     General: Bowel sounds are normal.     Palpations: Abdomen is soft.     Comments: Tender to palpation of LLQ and left obliques. No tenderness upon palpation of RUQ and RLQ. No CVA tenderness on exam. Otherwise nontender.   Skin:    General: Skin is warm and dry.  Neurological:     Mental Status: She is alert.     MAU Course  Procedures  MDM  UA  obtained Flexeril 10 mg x1  UA unremarkable  Pain improved w flexeril  Stable for DC home    Assessment and Plan   Muscle Strain -dc home w script for flexeril, encouraged gentle stretching, use of abdominal binder  -provided w return precautions    Janet Berlin 11/09/2020, 10:03 PM

## 2020-11-16 ENCOUNTER — Encounter: Payer: Medicaid Other | Admitting: Advanced Practice Midwife

## 2020-11-17 ENCOUNTER — Other Ambulatory Visit: Payer: Self-pay | Admitting: Family Medicine

## 2020-11-17 ENCOUNTER — Other Ambulatory Visit: Payer: Self-pay

## 2020-11-17 ENCOUNTER — Ambulatory Visit (INDEPENDENT_AMBULATORY_CARE_PROVIDER_SITE_OTHER): Payer: Medicaid Other | Admitting: Family Medicine

## 2020-11-17 VITALS — BP 120/81 | HR 101 | Wt 234.0 lb

## 2020-11-17 DIAGNOSIS — Z3480 Encounter for supervision of other normal pregnancy, unspecified trimester: Secondary | ICD-10-CM

## 2020-11-17 DIAGNOSIS — E039 Hypothyroidism, unspecified: Secondary | ICD-10-CM

## 2020-11-17 DIAGNOSIS — Z98891 History of uterine scar from previous surgery: Secondary | ICD-10-CM

## 2020-11-17 DIAGNOSIS — O283 Abnormal ultrasonic finding on antenatal screening of mother: Secondary | ICD-10-CM

## 2020-11-17 DIAGNOSIS — Z3A24 24 weeks gestation of pregnancy: Secondary | ICD-10-CM

## 2020-11-17 DIAGNOSIS — O09299 Supervision of pregnancy with other poor reproductive or obstetric history, unspecified trimester: Secondary | ICD-10-CM

## 2020-11-17 DIAGNOSIS — D690 Allergic purpura: Secondary | ICD-10-CM

## 2020-11-17 MED ORDER — BISACODYL EC 5 MG PO TBEC: 5.0000 mg | DELAYED_RELEASE_TABLET | Freq: Two times a day (BID) | ORAL | 0 refills | Status: DC

## 2020-11-17 NOTE — Progress Notes (Signed)
   PRENATAL VISIT NOTE  Subjective:  Nichole Bush is a 24 y.o. G2P0101 at [redacted]w[redacted]d being seen today for ongoing prenatal care.  She is currently monitored for the following issues for this high-risk pregnancy and has Henoch-Schonlein purpura (HCC); Asthma; Gastroesophageal reflux disease with esophagitis; Hypothyroidism; Vitamin D deficiency; Supervision of other normal pregnancy, antepartum; History of pre-eclampsia in prior pregnancy, currently pregnant; History of cesarean delivery; and Abnormal fetal ultrasound on their problem list.  Patient reports spotting. Consitpated - last stool was 3 weeks ago. This is normal for her, but starting to have left sided abdominal pain.  Contractions: Irritability. Vag. Bleeding: Scant.  Movement: Present. Denies leaking of fluid.   The following portions of the patient's history were reviewed and updated as appropriate: allergies, current medications, past family history, past medical history, past social history, past surgical history and problem list.   Objective:   Vitals:   11/17/20 1532  BP: 120/81  Pulse: (!) 101  Weight: 234 lb (106.1 kg)    Fetal Status: Fetal Heart Rate (bpm): 148 Fundal Height: 25 cm Movement: Present     General:  Alert, oriented and cooperative. Patient is in no acute distress.  Skin: Skin is warm and dry. No rash noted.   Cardiovascular: Normal heart rate noted  Respiratory: Normal respiratory effort, no problems with respiration noted  Abdomen: Soft, gravid, appropriate for gestational age.  Pain/Pressure: Present     Pelvic: Cervical exam performed in the presence of a chaperone      Normal cervix. No bleeding  Extremities: Normal range of motion.  Edema: None  Mental Status: Normal mood and affect. Normal behavior. Normal judgment and thought content.   Assessment and Plan:  Pregnancy: G2P0101 at [redacted]w[redacted]d 1. [redacted] weeks gestation of pregnancy  2. Abnormal fetal ultrasound Small pericardial effusion - scheduled for  repeat to follow  3. Supervision of other normal pregnancy, antepartum FHT and FH normal. Bisacodyl for constipation.   4. History of cesarean delivery Desires rpt  5. History of pre-eclampsia in prior pregnancy, currently pregnant On ASA 81mg . BP normal  6. Henoch-Schonlein purpura (HCC)  7. Hypothyroidism, unspecified type On synthroid. TSH stable.  Preterm labor symptoms and general obstetric precautions including but not limited to vaginal bleeding, contractions, leaking of fluid and fetal movement were reviewed in detail with the patient. Please refer to After Visit Summary for other counseling recommendations.   Return in about 4 weeks (around 12/15/2020) for OB f/u, 2 hr GTT.  Future Appointments  Date Time Provider Department Center  12/06/2020  2:45 PM [facility] NURSE [facility]  12/06/2020  3:00 PM [facility] US1 [facility]    [facility] DO

## 2020-11-18 ENCOUNTER — Other Ambulatory Visit: Payer: Self-pay

## 2020-11-18 ENCOUNTER — Inpatient Hospital Stay (HOSPITAL_COMMUNITY)
Admission: AD | Admit: 2020-11-18 | Discharge: 2020-11-18 | Disposition: A | Discharge status: Home / Self Care | Payer: Medicaid Other | Attending: Obstetrics & Gynecology | Admitting: Obstetrics & Gynecology

## 2020-11-18 ENCOUNTER — Encounter (HOSPITAL_COMMUNITY): Payer: Self-pay | Admitting: Obstetrics & Gynecology

## 2020-11-18 ENCOUNTER — Telehealth: Payer: Self-pay

## 2020-11-18 DIAGNOSIS — O4692 Antepartum hemorrhage, unspecified, second trimester: Secondary | ICD-10-CM | POA: Diagnosis not present

## 2020-11-18 DIAGNOSIS — R109 Unspecified abdominal pain: Secondary | ICD-10-CM | POA: Diagnosis not present

## 2020-11-18 DIAGNOSIS — O99891 Other specified diseases and conditions complicating pregnancy: Secondary | ICD-10-CM | POA: Diagnosis not present

## 2020-11-18 DIAGNOSIS — L02225 Furuncle of perineum: Secondary | ICD-10-CM | POA: Diagnosis not present

## 2020-11-18 DIAGNOSIS — O99612 Diseases of the digestive system complicating pregnancy, second trimester: Secondary | ICD-10-CM | POA: Diagnosis not present

## 2020-11-18 DIAGNOSIS — Z3A25 25 weeks gestation of pregnancy: Secondary | ICD-10-CM | POA: Diagnosis not present

## 2020-11-18 DIAGNOSIS — K5909 Other constipation: Secondary | ICD-10-CM | POA: Insufficient documentation

## 2020-11-18 DIAGNOSIS — O26892 Other specified pregnancy related conditions, second trimester: Secondary | ICD-10-CM | POA: Diagnosis present

## 2020-11-18 DIAGNOSIS — R42 Dizziness and giddiness: Secondary | ICD-10-CM | POA: Diagnosis not present

## 2020-11-18 DIAGNOSIS — Z79899 Other long term (current) drug therapy: Secondary | ICD-10-CM | POA: Insufficient documentation

## 2020-11-18 DIAGNOSIS — H538 Other visual disturbances: Secondary | ICD-10-CM | POA: Diagnosis not present

## 2020-11-18 DIAGNOSIS — R519 Headache, unspecified: Secondary | ICD-10-CM | POA: Diagnosis not present

## 2020-11-18 DIAGNOSIS — O99712 Diseases of the skin and subcutaneous tissue complicating pregnancy, second trimester: Secondary | ICD-10-CM | POA: Diagnosis not present

## 2020-11-18 DIAGNOSIS — O283 Abnormal ultrasonic finding on antenatal screening of mother: Secondary | ICD-10-CM

## 2020-11-18 DIAGNOSIS — Z3480 Encounter for supervision of other normal pregnancy, unspecified trimester: Secondary | ICD-10-CM

## 2020-11-18 LAB — WET PREP, GENITAL
Sperm: NONE SEEN
Trich, Wet Prep: NONE SEEN
Yeast Wet Prep HPF POC: NONE SEEN

## 2020-11-18 LAB — URINALYSIS, ROUTINE W REFLEX MICROSCOPIC
Bilirubin Urine: NEGATIVE
Glucose, UA: NEGATIVE mg/dL
Ketones, ur: NEGATIVE mg/dL
Nitrite: NEGATIVE
Protein, ur: 30 mg/dL — AB
Specific Gravity, Urine: 1.02 (ref 1.005–1.030)
pH: 7 (ref 5.0–8.0)

## 2020-11-18 LAB — COMPREHENSIVE METABOLIC PANEL
ALT: 22 U/L (ref 0–44)
AST: 25 U/L (ref 15–41)
Albumin: 2.5 g/dL — ABNORMAL LOW (ref 3.5–5.0)
Alkaline Phosphatase: 181 U/L — ABNORMAL HIGH (ref 38–126)
Anion gap: 11 (ref 5–15)
BUN: <5 mg/dL — ABNORMAL LOW (ref 6–20)
CO2: 19 mmol/L — ABNORMAL LOW (ref 22–32)
Calcium: 8.9 mg/dL (ref 8.9–10.3)
Chloride: 102 mmol/L (ref 98–111)
Creatinine, Ser: 0.39 mg/dL — ABNORMAL LOW (ref 0.44–1.00)
GFR, Estimated: >60 mL/min (ref >60)
Glucose, Bld: 80 mg/dL (ref 70–99)
Potassium: 3.4 mmol/L — ABNORMAL LOW (ref 3.5–5.1)
Sodium: 132 mmol/L — ABNORMAL LOW (ref 135–145)
Total Bilirubin: 0.3 mg/dL (ref 0.3–1.2)
Total Protein: 6.3 g/dL — ABNORMAL LOW (ref 6.5–8.1)

## 2020-11-18 LAB — CBC
HCT: 35 % — ABNORMAL LOW (ref 36.0–46.0)
Hemoglobin: 11.7 g/dL — ABNORMAL LOW (ref 12.0–15.0)
MCH: 30.2 pg (ref 26.0–34.0)
MCHC: 33.4 g/dL (ref 30.0–36.0)
MCV: 90.4 fL (ref 80.0–100.0)
Platelets: 261 10*3/uL (ref 150–400)
RBC: 3.87 MIL/uL (ref 3.87–5.11)
RDW: 12.5 % (ref 11.5–15.5)
WBC: 10.7 10*3/uL — ABNORMAL HIGH (ref 4.0–10.5)
nRBC: 0 % (ref 0.0–0.2)

## 2020-11-18 LAB — PROTEIN / CREATININE RATIO, URINE
Creatinine, Urine: 191.89 mg/dL
Protein Creatinine Ratio: 0.13 mg/mg{Cre} (ref 0.00–0.15)
Total Protein, Urine: 24 mg/dL

## 2020-11-18 MED ORDER — LACTATED RINGERS IV BOLUS
1000.0000 mL | Freq: Once | INTRAVENOUS | Status: AC
  Administered 2020-11-18: 19:00:00 1000 mL via INTRAVENOUS

## 2020-11-18 MED ORDER — MILK AND MOLASSES ENEMA
1.0000 | Freq: Once | RECTAL | Status: AC
  Administered 2020-11-18: 240 mL via RECTAL
  Filled 2020-11-18 (×2): qty 240

## 2020-11-18 MED ORDER — METOCLOPRAMIDE HCL 5 MG/ML IJ SOLN
10.0000 mg | Freq: Once | INTRAMUSCULAR | Status: AC
  Administered 2020-11-18: 19:00:00 10 mg via INTRAVENOUS
  Filled 2020-11-18: qty 2

## 2020-11-18 MED ORDER — DIPHENHYDRAMINE HCL 50 MG/ML IJ SOLN
25.0000 mg | Freq: Once | INTRAMUSCULAR | Status: AC
  Administered 2020-11-18: 19:00:00 25 mg via INTRAVENOUS
  Filled 2020-11-18: qty 1

## 2020-11-18 MED ORDER — DEXAMETHASONE SODIUM PHOSPHATE 10 MG/ML IJ SOLN
10.0000 mg | Freq: Once | INTRAMUSCULAR | Status: AC
  Administered 2020-11-18: 19:00:00 10 mg via INTRAVENOUS
  Filled 2020-11-18: qty 1

## 2020-11-18 MED ORDER — DEXAMETHASONE SODIUM PHOSPHATE 10 MG/ML IJ SOLN
INTRAMUSCULAR | Status: AC
  Filled 2020-11-18: qty 1

## 2020-11-18 NOTE — MAU Provider Note (Addendum)
History     CSN: [id number]  Arrival date and time: 11/18/20 1738   Event Date/Time   First Provider Initiated Contact with Patient 11/18/20 1840      Chief Complaint  Patient presents with  . Vaginal Bleeding  . Abdominal Pain  . Headache   Nichole Bush is a 24 y.o. G2P1 at 71w6dwho presents to MAU with complaints of vaginal bleeding, abdominal pain and headache. Patient reports that she woke up this morning "feeling off", she reports that she woke up dizzy and with a headache. She reports blurred vision is associated with dizziness and headache. Rates HA 7/10. Patient reports she took tylenol around 1100 without relief. Around lunch she started having abdominal pain, describes abdominal pain as lower abdominal cramping that is intermittent, rates pain 4/10. Denies vaginal discharge, LOF. +FM. She reports this afternoon she was sitting on the toilet and started having bright red vaginal bleeding in the toilet and when she wiped. She denies recent IC. Reports that vaginal spotting has happened before 2 weeks ago but that it only occurred that day. She also reports problems with constipation prior to pregnancy, reports last BM the day after thanksgiving per patient.    OB History    Gravida  2   Para  1   Term      Preterm  1   AB      Living  1     SAB      IAB      Ectopic      Multiple  0   Live Births  1           Past Medical History:  Diagnosis Date  . Constipation   . Gestational diabetes   . 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 Hypertension   . Migraine   . Thyroid disease     Past Surgical History:  Procedure Laterality Date  . CESAREAN SECTION N/A 04/10/2019   Procedure: CESAREAN SECTION;  Surgeon: PDonnamae Jude MD;  Location: [facility];  Service: Obstetrics;  Laterality: N/A;  . ESOPHAGOGASTRODUODENOSCOPY    . WISDOM TOOTH EXTRACTION      Family History  Problem Relation Age of Onset  . Migraines Father   . Hypertension Father      Social History   Tobacco Use  . Smoking status: Never Smoker  . Smokeless tobacco: Never Used  Vaping Use  . Vaping Use: Former  Substance Use Topics  . Alcohol use: No    Alcohol/week: 0.0 standard drinks  . Drug use: No    Allergies:  Allergies  Allergen Reactions  . Peanut-Containing Drug Products Anaphylaxis  . Penicillins Anaphylaxis, Hives and Other (See Comments)    Has patient had a PCN reaction causing immediate rash, facial/tongue/throat swelling, SOB or lightheadedness with hypotension: Yes Has patient had a PCN reaction causing severe rash involving mucus membranes or skin necrosis: No Has patient had a PCN reaction that required hospitalization No Has patient had a PCN reaction occurring within the last 10 years: No If all of the above answers are "NO", then may proceed with Cephalosporin use.   .Marland KitchenPeanut Oil Hives    Medications Prior to Admission  Medication Sig Dispense Refill Last Dose  . AMBULATORY NON FORMULARY MEDICATION 1 Device by Other route once a week. [place] pressure cuff /Large  Monitored Regularly at home  ICD 10: HROB O09.90 1 kit 0 11/18/2020 at Unknown time  . aspirin EC 81 MG  tablet Take 81 mg by mouth every other day. Swallow whole.   11/17/2020 at Unknown time  . bisacodyl (BISACODYL) 5 MG EC tablet Take 1 tablet (5 mg total) by mouth 2 (two) times daily. 30 tablet 0 11/18/2020 at Unknown time  . Butalbital-APAP-Caffeine 50-325-40 MG capsule Take 1-2 capsules by mouth every 6 (six) hours as needed for headache. 30 capsule 0 Past Week at Unknown time  . cyclobenzaprine (FLEXERIL) 10 MG tablet Take 1 tablet (10 mg total) by mouth 3 (three) times daily as needed for muscle spasms. 30 tablet 1 Past Week at Unknown time  . fluticasone (FLOVENT HFA) 110 MCG/ACT inhaler Inhale 2 puffs into the lungs daily. 1 each 12 Past Week at Unknown time  . ipratropium-albuterol (DUONEB) 0.5-2.5 (3) MG/3ML SOLN Inhale into the lungs.   Past Week at Unknown time   . levothyroxine (SYNTHROID) 50 MCG tablet Take 1 tablet (50 mcg total) by mouth daily before breakfast. 30 tablet 1 11/18/2020 at Unknown time  . pantoprazole (PROTONIX) 20 MG tablet Take 1 tablet (20 mg total) by mouth daily. 30 tablet 1 Past Week at Unknown time  . polyethylene glycol powder (GLYCOLAX/MIRALAX) 17 GM/SCOOP powder Take by mouth.   11/18/2020 at Unknown time  . Prenat-FeAsp-Meth-FA-DHA w/o A (PRENATE DHA) 18-0.6-0.4-300 MG CAPS Take 1 capsule by mouth daily. 30 capsule 11 11/18/2020 at Unknown time  . Elastic Bandages & Supports (COMFORT FIT MATERNITY SUPP MED) MISC 1 Device by Does not apply route daily. 1 each 1   . ondansetron (ZOFRAN ODT) 4 MG disintegrating tablet Take 1 tablet (4 mg total) by mouth every 6 (six) hours as needed for nausea. 20 tablet 2   . promethazine (PHENERGAN) 12.5 MG tablet Take by mouth.     . promethazine (PHENERGAN) 25 MG tablet Take 1 tablet (25 mg total) by mouth every 6 (six) hours as needed for nausea or vomiting. 60 tablet 2     Review of Systems  Constitutional: Negative.   Eyes: Positive for visual disturbance.  Respiratory: Negative.   Cardiovascular: Negative.   Gastrointestinal: Positive for abdominal pain and constipation. Negative for diarrhea, nausea and vomiting.  Genitourinary: Positive for vaginal bleeding. Negative for difficulty urinating, dysuria, flank pain, frequency, pelvic pain and urgency.  Musculoskeletal: Negative.   Neurological: Positive for dizziness and headaches. Negative for syncope and weakness.  Psychiatric/Behavioral: Negative.    Physical Exam   Blood pressure 130/74, pulse (!) 109, temperature 98.4 F (36.9 C), temperature source Oral, resp. rate 18, height 5' 2"  (1.575 m), weight 106.4 kg, last menstrual period 05/28/2020, SpO2 100 %, not currently breastfeeding.  Physical Exam Vitals and nursing note reviewed. Exam conducted with a chaperone present.  Constitutional:      Appearance: Normal appearance.   HENT:     Head: Normocephalic.  Cardiovascular:     Rate and Rhythm: Normal rate and regular rhythm.  Pulmonary:     Effort: Pulmonary effort is normal. No respiratory distress.     Breath sounds: Normal breath sounds. No wheezing.  Abdominal:     Palpations: Abdomen is soft. There is no mass.     Tenderness: There is no abdominal tenderness. There is no right CVA tenderness, left CVA tenderness or guarding.     Comments: Gravid appropriate for gestational age  Genitourinary:    Exam position: Lithotomy position.     Vagina: Vaginal discharge present. No bleeding.       Comments: Pelvic exam: Cervix pink, visually open to 0.5cm, without  lesion, scant white creamy discharge, vaginal walls and external genitalia normal  Skin:    General: Skin is warm and dry.  Neurological:     Mental Status: She is alert and oriented to person, place, and time.  Psychiatric:        Mood and Affect: Mood normal.        Behavior: Behavior normal.        Thought Content: Thought content normal.    MAU Course  Procedures  MDM Orders Placed This Encounter  Procedures  . Wet prep, genital  . Urinalysis, Routine w reflex microscopic Urine, Clean Catch  . CBC  . Comprehensive metabolic panel  . Protein / creatinine ratio, urine  . Insert peripheral IV   Meds ordered this encounter  Medications  . AND Linked Order Group   . diphenhydrAMINE (BENADRYL) injection 25 mg   . metoCLOPramide (REGLAN) injection 10 mg   . dexamethasone (DECADRON) injection 10 mg  . lactated ringers bolus 1,000 mL  . DISCONTD: dexamethasone (DECADRON) 10 MG/ML injection    Aube, Melissa   : cabinet override   Labs pending @ 1923.  HA cocktail given @ 1920, patient reports that she can have someone pick her up prior to administration of HA cocktail.  Reassessment of patient after HA cocktail, patient reports HA 4/10.   Labs continue to be pending.  Care taken over by Hansel Feinstein CNM @2011   Lajean Manes,  CNM 11/18/20, 8:11 PM  Abscess on right buttocks, near introitus.  Indurated, not fluctuant.  Discussed warm soaks, expectant management Is now getting M/M enema >> produced small results. States never gets good results with enemas. No further cramping for now   Assessment and Plan  A;  Single IUP at [redacted]w[redacted]d     Abdominal cramping      Chronic constipation       Furuncle of perineum  P:   Discharge home      Rx MIralax with protocol from Dr NErnestina Patches     Warm soaks to perineum      Followup in office as scheduled

## 2020-11-18 NOTE — MAU Note (Signed)
Earlier today started feeling "like her thyroids were low", having numbness and swelling in left hand (hx of carpal tunnel), felt like she was about to black out. t 1542, started bleeding, spots noted in toilet.  Now is cramping.   Denies previa or LLplacenta.

## 2020-11-18 NOTE — Discharge Instructions (Signed)
Chronic Constipation  Chronic constipation is a condition in which a person has three or fewer bowel movements a week, for three months or longer. This condition is especially common in older adults. The two main kinds of chronic constipation are secondary constipation and functional constipation. Secondary constipation results from another condition or a treatment. Functional constipation, also called primary or idiopathic constipation, is divided into three types:  Normal transit constipation. In this type, movement of stool through the colon (stool transit) occurs normally.  Slow transit constipation. In this type, stool moves slowly through the colon.  Outlet constipation or pelvic floor dysfunction. In this type, the nerves and muscles that empty the rectum do not work normally. What are the causes? Causes of secondary constipation may include:  Failing to drink enough fluid, eat enough food or fiber, or get physically active.  Pregnancy.  A tear in the anus (anal fissure).  Blockage in the bowel (bowel obstruction).  Narrowing of the bowel (bowel stricture).  Having a long-term medical condition, such as: ? Diabetes. ? Hypothyroidism. ? Multiple sclerosis. ? Parkinson disease. ? Stroke. ? Spinal cord injury. ? Dementia. ? Colon cancer. ? Inflammatory bowel disease (IBD). ? Iron-deficiency anemia. ? Outward collapse of the rectum (rectal prolapse). ? Hemorrhoids.  Taking certain medicines, including: ? Narcotics. These are a certain type of prescription pain medicine. ? Antacids. ? Iron supplements. ? Water pills (diuretics). ? Certain blood pressure medicines. ? Anti-seizure medicines. ? Antidepressants. ? Medicines for Parkinson disease. The cause of functional constipation is not known, but some conditions are associated with it. These conditions include:  Stress.  Problems in the nerves and muscles that control stool transit.  Weak or impaired pelvic floor  muscles. What increases the risk? You may be at higher risk for chronic constipation if you:  Are older than age 70.  Are female.  Live in a long-term care facility.  Do not get much exercise or physical activity (have a sedentary lifestyle).  Do not drink enough fluids.  Do not eat enough food, especially fiber.  Have a long-term disease.  Have a mental health disorder or eating disorder.  Take many medicines. What are the signs or symptoms? The main symptom of chronic constipation is having three or fewer bowel movements a week for several weeks. Other signs and symptoms may vary from person to person. These include:  Pushing hard (straining) to pass stool.  Painful bowel movements.  Having hard or lumpy stools.  Having lower belly discomfort, such as cramps or bloating.  Being unable to have a bowel movement when you feel the urge.  Feeling like you still need to pass stool after a bowel movement.  Feeling that you have something in your rectum that is blocking or preventing bowel movements.  Seeing blood on the toilet paper or in your stool.  Worsening confusion (in older adults). How is this diagnosed? This condition may be diagnosed based on:  Symptoms and medical history. You will be asked about your symptoms, lifestyle, diet, and any medicines that you are taking.  Physical exam. ? Your belly (abdomen) will be examined. ? A digital rectal exam may be done. For this exam, a health care provider places a lubricated, gloved finger into the rectum.  Other tests to check for any underlying causes of your constipation. These may be ordered if you have bleeding in your rectum, weight loss, or a family history of colon cancer. In these cases, you may have: ? Imaging studies of   the colon. These may include X-ray, ultrasound, or CT scan. ? Blood tests. ? A procedure to examine the inside of your colon (colonoscopy). ? More specialized tests to check:  Whether  your anal sphincter works well. This is a ring-shaped muscle that controls the closing of the anus.  How well food moves through your colon. ? Tests to measure the nerve signal in your pelvic floor muscles (electromyography). How is this treated? Treatment for chronic constipation depends on the cause. Most often, treatment starts with:  Being more active and getting regular exercise.  Drinking more fluids.  Adding fiber to your diet. Sources of fiber include fruits, vegetables, whole grains, and fiber supplements.  Using medicines such as stool softeners or medicines that increase contractions in your digestive system (pro-motility agents).  Training your pelvic muscles with biofeedback.  Surgery, if there is obstruction. Treatment for secondary chronic constipation depends on the underlying condition. You may need to:  Stop or change some medicines if they cause constipation.  Use a fiber supplement (bulk laxative) or stool softener.  Use prescription laxative. This works by absorbing water into your colon (osmotic laxative). You may also need to see a specialist who treats conditions of the digestive system (gastroenterologist). Follow these instructions at home:   Take over-the-counter and prescription medicines only as told by your health care provider.  If you are taking a laxative, take it as told by your health care provider.  Eat a balanced diet that includes enough fiber. Ask your health care provider to recommend a diet that is right for you.  Drink clear fluids, especially water. Avoid drinking alcohol, caffeine, and soda.  Drink enough fluid to keep your urine pale yellow.  Get some physical activity every day. Ask your health care provider what physical activities are safe for you.  Get colon cancer screenings as told by your health care provider.  Keep all follow-up visits as told by your health care provider. This is important. Contact a health care  provider if:  You are having three or fewer bowel movements a week.  Your stools are hard or lumpy.  You notice blood on the toilet paper or in your stool after you have a bowel movement.  You have unexplained weight loss.  You have rectum (rectal) pain.  You have stool leakage.  You experience nausea or vomiting. Get help right away if:  You have rectal bleeding or you pass blood clots.  You have severe rectal pain.  You have body tissue that pushes out (protrudes) from your anus.  You have severe pain or bloating (distension) in your abdomen.  You have vomiting that you cannot control. Summary  Chronic constipation is a condition in which a person has three or fewer bowel movements a week, for three months or longer.  You may have a higher risk for this condition if you are an older adult, or if you do not drink enough water or get enough physical activity (are sedentary).  Treatment for this condition depends on the cause. Most treatments for chronic constipation include adding fiber to your diet, drinking more fluids, and getting more physical activity. You may also need to treat any underlying medical conditions or stop or change certain medicines if they cause constipation.  If lifestyle changes do not relieve constipation, your health care provider may recommend taking a laxative. This information is not intended to replace advice given to you by your health care provider. Make sure you discuss any questions you   have with your health care provider. Document Revised: 11/02/2017 Document Reviewed: 08/07/2017 Elsevier Patient Education  2020 [employer]    [facility] Clean OUt You have constipation which is hard stools that are difficult to pass. It is important to have regular bowel movements every 1-3 days that are soft and easy to pass. Hard stools increase your risk of hemorrhoids and are very uncomfortable.   To prevent constipation you can increase the amount of  fiber in your diet. Examples of foods with fiber are leafy greens, whole grain breads, oatmeal and other grains.  It is also important to drink at least eight 8oz glass of water everyday.   If you have not has a bowel movement in 4-5 days you made need to clean out your bowel.  This will have establish normal movement through your bowel.    Miralax Clean out  Take 8 capfuls of miralax in 64 oz of gatorade. You can use any fluid that appeals to you (gatorade, water, juice)  Continue to drink at least eight 8 oz glasses of water throughout the day  You can repeat with another 8 capfuls of miralax in 64 oz of gatorade if you are not having a large amount of stools  You will need to be at home and close to a bathroom for about 8 hours when you do the above as you may need to go to the bathroom frequently.   After you are cleaned out: - Start Colace100mg  twice daily - Start Miralax once daily - Start a daily fiber supplement like metamucil or citrucel - You can safely use enemas in pregnancy  - if you are having diarrhea you can reduce to Colace once a day or miralax every other day or a 1/2 capful daily.

## 2020-11-18 NOTE — Telephone Encounter (Signed)
Pt called crying stating she is bleeding. Pt states she saw blood in the toilet and when she wiped. Pt states she felt baby move earlier. She states her grandmother is coming to take her to [facility] but she doesn't know when her grandmother will be arriving. I advised pt to go by ambulance if she needs to. Understanding was voiced. Angeni Chaudhuri l Izel Eisenhardt, CMA

## 2020-11-19 LAB — GC/CHLAMYDIA PROBE AMP (~~LOC~~) NOT AT ARMC
Chlamydia: NEGATIVE
Comment: NEGATIVE
Comment: NORMAL
Neisseria Gonorrhea: NEGATIVE

## 2020-12-06 ENCOUNTER — Encounter: Payer: Self-pay | Admitting: *Deleted

## 2020-12-06 ENCOUNTER — Ambulatory Visit: Payer: Medicaid Other | Admitting: *Deleted

## 2020-12-06 ENCOUNTER — Ambulatory Visit: Payer: Medicaid Other | Attending: Obstetrics and Gynecology

## 2020-12-06 ENCOUNTER — Ambulatory Visit (HOSPITAL_BASED_OUTPATIENT_CLINIC_OR_DEPARTMENT_OTHER): Payer: Medicaid Other | Admitting: *Deleted

## 2020-12-06 ENCOUNTER — Other Ambulatory Visit: Payer: Self-pay | Admitting: Obstetrics and Gynecology

## 2020-12-06 ENCOUNTER — Other Ambulatory Visit: Payer: Self-pay

## 2020-12-06 DIAGNOSIS — O99282 Endocrine, nutritional and metabolic diseases complicating pregnancy, second trimester: Secondary | ICD-10-CM | POA: Insufficient documentation

## 2020-12-06 DIAGNOSIS — O365921 Maternal care for other known or suspected poor fetal growth, second trimester, fetus 1: Secondary | ICD-10-CM | POA: Diagnosis not present

## 2020-12-06 DIAGNOSIS — Z3A27 27 weeks gestation of pregnancy: Secondary | ICD-10-CM | POA: Diagnosis not present

## 2020-12-06 DIAGNOSIS — O283 Abnormal ultrasonic finding on antenatal screening of mother: Secondary | ICD-10-CM | POA: Diagnosis present

## 2020-12-06 DIAGNOSIS — E039 Hypothyroidism, unspecified: Secondary | ICD-10-CM | POA: Insufficient documentation

## 2020-12-06 DIAGNOSIS — O09292 Supervision of pregnancy with other poor reproductive or obstetric history, second trimester: Secondary | ICD-10-CM

## 2020-12-06 DIAGNOSIS — Z3480 Encounter for supervision of other normal pregnancy, unspecified trimester: Secondary | ICD-10-CM | POA: Insufficient documentation

## 2020-12-06 DIAGNOSIS — O99212 Obesity complicating pregnancy, second trimester: Secondary | ICD-10-CM

## 2020-12-06 DIAGNOSIS — O99512 Diseases of the respiratory system complicating pregnancy, second trimester: Secondary | ICD-10-CM

## 2020-12-06 DIAGNOSIS — O365920 Maternal care for other known or suspected poor fetal growth, second trimester, not applicable or unspecified: Secondary | ICD-10-CM

## 2020-12-06 DIAGNOSIS — O34219 Maternal care for unspecified type scar from previous cesarean delivery: Secondary | ICD-10-CM | POA: Diagnosis not present

## 2020-12-06 DIAGNOSIS — E669 Obesity, unspecified: Secondary | ICD-10-CM

## 2020-12-06 DIAGNOSIS — J45909 Unspecified asthma, uncomplicated: Secondary | ICD-10-CM

## 2020-12-06 NOTE — Procedures (Signed)
[name] 1996-09-20 [redacted]w[redacted]d  Fetus A Non-Stress Test Interpretation for 12/06/20  Indication: Abnormal Dopplers, low normal amniotic fluid  Fetal Heart Rate A Mode: External Baseline Rate (A): 140 bpm Variability: Moderate Accelerations: 10 x 10 Decelerations: None Multiple birth?: No  Uterine Activity Mode: Palpation,Toco Contraction Frequency (min): None Resting Tone Palpated: Relaxed Resting Time: Adequate  Interpretation (Fetal Testing) Nonstress Test Interpretation: Reactive Overall Impression: Reassuring for gestational age Comments: Dr. Parke Poisson reviewed tracing.

## 2020-12-07 ENCOUNTER — Other Ambulatory Visit: Payer: Self-pay | Admitting: *Deleted

## 2020-12-09 DIAGNOSIS — U071 COVID-19: Secondary | ICD-10-CM

## 2020-12-09 DIAGNOSIS — O98519 Other viral diseases complicating pregnancy, unspecified trimester: Secondary | ICD-10-CM

## 2020-12-09 HISTORY — DX: Other viral diseases complicating pregnancy, unspecified trimester: O98.519

## 2020-12-09 HISTORY — DX: COVID-19: U07.1

## 2020-12-11 ENCOUNTER — Inpatient Hospital Stay (HOSPITAL_COMMUNITY)
Admission: AD | Admit: 2020-12-11 | Discharge: 2020-12-11 | Disposition: A | Discharge status: Home / Self Care | Payer: Medicaid Other | Attending: Family Medicine | Admitting: Family Medicine

## 2020-12-11 ENCOUNTER — Encounter (HOSPITAL_COMMUNITY): Payer: Self-pay | Admitting: Family Medicine

## 2020-12-11 ENCOUNTER — Other Ambulatory Visit: Payer: Self-pay

## 2020-12-11 DIAGNOSIS — Z3A28 28 weeks gestation of pregnancy: Secondary | ICD-10-CM

## 2020-12-11 DIAGNOSIS — O1403 Mild to moderate pre-eclampsia, third trimester: Secondary | ICD-10-CM

## 2020-12-11 DIAGNOSIS — R109 Unspecified abdominal pain: Secondary | ICD-10-CM | POA: Diagnosis present

## 2020-12-11 DIAGNOSIS — Z880 Allergy status to penicillin: Secondary | ICD-10-CM | POA: Insufficient documentation

## 2020-12-11 DIAGNOSIS — O1400 Mild to moderate pre-eclampsia, unspecified trimester: Secondary | ICD-10-CM

## 2020-12-11 DIAGNOSIS — U071 COVID-19: Secondary | ICD-10-CM | POA: Diagnosis not present

## 2020-12-11 DIAGNOSIS — Z7982 Long term (current) use of aspirin: Secondary | ICD-10-CM | POA: Insufficient documentation

## 2020-12-11 DIAGNOSIS — O98513 Other viral diseases complicating pregnancy, third trimester: Secondary | ICD-10-CM

## 2020-12-11 LAB — COMPREHENSIVE METABOLIC PANEL
ALT: 20 U/L (ref 0–44)
AST: 33 U/L (ref 15–41)
Albumin: 2.2 g/dL — ABNORMAL LOW (ref 3.5–5.0)
Alkaline Phosphatase: 190 U/L — ABNORMAL HIGH (ref 38–126)
Anion gap: 10 (ref 5–15)
BUN: 6 mg/dL (ref 6–20)
CO2: 20 mmol/L — ABNORMAL LOW (ref 22–32)
Calcium: 8.8 mg/dL — ABNORMAL LOW (ref 8.9–10.3)
Chloride: 108 mmol/L (ref 98–111)
Creatinine, Ser: 0.57 mg/dL (ref 0.44–1.00)
GFR, Estimated: >60 mL/min (ref >60)
Glucose, Bld: 77 mg/dL (ref 70–99)
Potassium: 3.9 mmol/L (ref 3.5–5.1)
Sodium: 138 mmol/L (ref 135–145)
Total Bilirubin: 0.3 mg/dL (ref 0.3–1.2)
Total Protein: 5.7 g/dL — ABNORMAL LOW (ref 6.5–8.1)

## 2020-12-11 LAB — CBC WITH DIFFERENTIAL/PLATELET
Abs Immature Granulocytes: 0.02 10*3/uL (ref 0.00–0.07)
Basophils Absolute: 0 10*3/uL (ref 0.0–0.1)
Basophils Relative: 0 %
Eosinophils Absolute: 0.1 10*3/uL (ref 0.0–0.5)
Eosinophils Relative: 1 %
HCT: 36 % (ref 36.0–46.0)
Hemoglobin: 12.8 g/dL (ref 12.0–15.0)
Immature Granulocytes: 0 %
Lymphocytes Relative: 46 %
Lymphs Abs: 2.8 10*3/uL (ref 0.7–4.0)
MCH: 31.5 pg (ref 26.0–34.0)
MCHC: 35.6 g/dL (ref 30.0–36.0)
MCV: 88.7 fL (ref 80.0–100.0)
Monocytes Absolute: 0.5 10*3/uL (ref 0.1–1.0)
Monocytes Relative: 8 %
Neutro Abs: 2.8 10*3/uL (ref 1.7–7.7)
Neutrophils Relative %: 45 %
Platelets: 256 10*3/uL (ref 150–400)
RBC: 4.06 MIL/uL (ref 3.87–5.11)
RDW: 12.9 % (ref 11.5–15.5)
WBC: 6.2 10*3/uL (ref 4.0–10.5)
nRBC: 0 % (ref 0.0–0.2)

## 2020-12-11 LAB — URINALYSIS, ROUTINE W REFLEX MICROSCOPIC
Glucose, UA: NEGATIVE mg/dL
Hgb urine dipstick: NEGATIVE
Ketones, ur: 5 mg/dL — AB
Nitrite: NEGATIVE
Protein, ur: >=300 mg/dL — AB
Specific Gravity, Urine: 1.033 — ABNORMAL HIGH (ref 1.005–1.030)
pH: 6 (ref 5.0–8.0)

## 2020-12-11 LAB — RESP PANEL BY RT-PCR (FLU A&B, COVID) ARPGX2
Influenza A by PCR: NEGATIVE
Influenza B by PCR: NEGATIVE
SARS Coronavirus 2 by RT PCR: POSITIVE — AB

## 2020-12-11 LAB — PROTEIN / CREATININE RATIO, URINE
Creatinine, Urine: 509.05 mg/dL
Protein Creatinine Ratio: 1.46 mg/mg{Cre} — ABNORMAL HIGH (ref 0.00–0.15)
Total Protein, Urine: 744 mg/dL

## 2020-12-11 MED ORDER — FAMOTIDINE IN NACL 20-0.9 MG/50ML-% IV SOLN
20.0000 mg | Freq: Once | INTRAVENOUS | Status: AC
  Administered 2020-12-11: 20 mg via INTRAVENOUS
  Filled 2020-12-11: qty 50

## 2020-12-11 MED ORDER — LACTATED RINGERS IV BOLUS
1000.0000 mL | Freq: Once | INTRAVENOUS | Status: AC
  Administered 2020-12-11: 1000 mL via INTRAVENOUS

## 2020-12-11 MED ORDER — BETAMETHASONE SOD PHOS & ACET 6 (3-3) MG/ML IJ SUSP
12.0000 mg | Freq: Once | INTRAMUSCULAR | Status: AC
  Administered 2020-12-11: 12 mg via INTRAMUSCULAR
  Filled 2020-12-11: qty 5

## 2020-12-11 NOTE — Discharge Instructions (Signed)
10 Things You Can Do to Manage Your COVID-19 Symptoms at Home °If you have possible or confirmed COVID-19: °1. Stay home from work and school. And stay away from other public places. If you must go out, avoid using any kind of public transportation, ridesharing, or taxis. °2. Monitor your symptoms carefully. If your symptoms get worse, call your healthcare provider immediately. °3. Get rest and stay hydrated. °4. If you have a medical appointment, call the healthcare provider ahead of time and tell them that you have or may have COVID-19. °5. For medical emergencies, call 911 and notify the dispatch personnel that you have or may have COVID-19. °6. Cover your cough and sneezes with a tissue or use the inside of your elbow. °7. Wash your hands often with soap and water for at least 20 seconds or clean your hands with an alcohol-based hand sanitizer that contains at least 60% alcohol. °8. As much as possible, stay in a specific room and away from other people in your home. Also, you should use a separate bathroom, if available. If you need to be around other people in or outside of the home, wear a mask. °9. Avoid sharing personal items with other people in your household, like dishes, towels, and bedding. °10. Clean all surfaces that are touched often, like counters, tabletops, and doorknobs. Use household cleaning sprays or wipes according to the label instructions. °cdc.gov/coronavirus °06/04/2019 °This information is not intended to replace advice given to you by your health care provider. Make sure you discuss any questions you have with your health care provider. °Document Revised: 11/06/2019 Document Reviewed: 11/06/2019 °Elsevier Patient Education © 2020 Elsevier Inc[employer] ° °Preeclampsia and Eclampsia °Preeclampsia is a serious condition that may develop during pregnancy. This condition causes high blood pressure and increased protein in your urine along with other symptoms, such as headaches and vision changes.  These symptoms may develop as the condition gets worse. Preeclampsia may occur at 20 weeks of pregnancy or later. °Diagnosing and treating preeclampsia early is very important. If not treated early, it can cause serious problems for you and your baby. One problem it can lead to is eclampsia. Eclampsia is a condition that causes muscle jerking or shaking (convulsions or seizures) and other serious problems for the mother. During pregnancy, delivering your baby may be the best treatment for preeclampsia or eclampsia. For most women, preeclampsia and eclampsia symptoms go away after giving birth. °In rare cases, a woman may develop preeclampsia after giving birth (postpartum preeclampsia). This usually occurs within 48 hours after childbirth but may occur up to 6 weeks after giving birth. °What are the causes? °The cause of preeclampsia is not known. °What increases the risk? °The following risk factors make you more likely to develop preeclampsia: °· Being pregnant for the first time. °· Having had preeclampsia during a past pregnancy. °· Having a family history of preeclampsia. °· Having high blood pressure. °· Being pregnant with more than one baby. °· Being 35 or older. °· Being African-American. °· Having kidney disease or diabetes. °· Having medical conditions such as lupus or blood diseases. °· Being very overweight (obese). °What are the signs or symptoms? °The most common symptoms are: °· Severe headaches. °· Vision problems, such as blurred or double vision. °· Abdominal pain, especially upper abdominal pain. °Other symptoms that may develop as the condition gets worse include: °· Sudden weight gain. °· Sudden swelling of the hands, face, legs, and feet. °· Severe nausea and vomiting. °· Numbness in   the face, arms, legs, and feet. °· Dizziness. °· Urinating less than usual. °· Slurred speech. °· Convulsions or seizures. °How is this diagnosed? °There are no screening tests for preeclampsia. Your health care  provider will ask you about symptoms and check for signs of preeclampsia during your prenatal visits. You may also have tests that include: °· Checking your blood pressure. °· Urine tests to check for protein. Your health care provider will check for this at every prenatal visit. °· Blood tests. °· Monitoring your baby's heart rate. °· Ultrasound. °How is this treated? °You and your health care provider will determine the treatment approach that is best for you. Treatment may include: °· Having more frequent prenatal exams to check for signs of preeclampsia, if you have an increased risk for preeclampsia. °· Medicine to lower your blood pressure. °· Staying in the hospital, if your condition is severe. There, treatment will focus on controlling your blood pressure and the amount of fluids in your body (fluid retention). °· Taking medicine (magnesium sulfate) to prevent seizures. This may be given as an injection or through an IV. °· Taking a low-dose aspirin during your pregnancy. °· Delivering your baby early. You may have your labor started with medicine (induced), or you may have a cesarean delivery. °Follow these instructions at home: °Eating and drinking ° °· Drink enough fluid to keep your urine pale yellow. °· Avoid caffeine. °Lifestyle °· Do not use any products that contain nicotine or tobacco, such as cigarettes and e-cigarettes. If you need help quitting, ask your health care provider. °· Do not use alcohol or drugs. °· Avoid stress as much as possible. Rest and get plenty of sleep. °General instructions °· Take over-the-counter and prescription medicines only as told by your health care provider. °· When lying down, lie on your left side. This keeps pressure off your major blood vessels. °· When sitting or lying down, raise (elevate) your feet. Try putting some pillows underneath your lower legs. °· Exercise regularly. Ask your health care provider what kinds of exercise are best for you. °· Keep all  follow-up and prenatal visits as told by your health care provider. This is important. °How is this prevented? °There is no known way of preventing preeclampsia or eclampsia from developing. However, to lower your risk of complications and detect problems early: °· Get regular prenatal care. Your health care provider may be able to diagnose and treat the condition early. °· Maintain a healthy weight. Ask your health care provider for help managing weight gain during pregnancy. °· Work with your health care provider to manage any long-term (chronic) health conditions you have, such as diabetes or kidney problems. °· You may have tests of your blood pressure and kidney function after giving birth. °· Your health care provider may have you take low-dose aspirin during your next pregnancy. °Contact a health care provider if: °· You have symptoms that your health care provider told you may require more treatment or monitoring, such as: °? Headaches. °? Nausea or vomiting. °? Abdominal pain. °? Dizziness. °? Light-headedness. °Get help right away if: °· You have severe: °? Abdominal pain. °? Headaches that do not get better. °? Dizziness. °? Vision problems. °? Confusion. °? Nausea or vomiting. °· You have any of the following: °? A seizure. °? Sudden, rapid weight gain. °? Sudden swelling in your hands, ankles, or face. °? Trouble moving any part of your body. °? Numbness in any part of your body. °? Trouble speaking. °?   Abnormal bleeding.  You faint. Summary  Preeclampsia is a serious condition that may develop during pregnancy.  This condition causes high blood pressure and increased protein in your urine along with other symptoms, such as headaches and vision changes.  Diagnosing and treating preeclampsia early is very important. If not treated early, it can cause serious problems for you and your baby.  Get help right away if you have symptoms that your health care provider told you to watch for. This  information is not intended to replace advice given to you by your health care provider. Make sure you discuss any questions you have with your health care provider. Document Revised: 07/23/2018 Document Reviewed: 06/26/2016 Elsevier Patient Education  2020 [employer].

## 2020-12-11 NOTE — MAU Provider Note (Signed)
History     CSN: [id number]  Arrival date and time: 12/11/20 1613   Event Date/Time   First Provider Initiated Contact with Patient 12/11/20 1656      Chief Complaint  Patient presents with  . Abdominal Pain   Nichole Bush is a 25 y.o. G2P0101 at 34w1dwho presents today with abdominal pain since 12/09/2020. She states that every time she eats or drinks she has a sharp pain in her lower abdomen. She denies any contractions, VB or LOF. She reports that she is feeling fetal movement. Patient was noted to be hypertenstive on arrival. She has not had elevated blood pressure, but did have pre-eclampsia with severe features with her prior pregnancy. She was induced at 35 weeks with that pregnancy due to [name]   Abdominal Pain This is a new problem. The current episode started in the past 7 days. The onset quality is sudden. The problem occurs constantly. The problem has been unchanged. The pain is located in the suprapubic region. Pain scale: 0/10 when not eating, but after eating it is 9/10  The quality of the pain is sharp. The abdominal pain does not radiate. Pertinent negatives include no constipation (last BM 12/11/2020), diarrhea, dysuria, fever, nausea or vomiting. The pain is aggravated by eating. The pain is relieved by nothing. She has tried nothing for the symptoms.    OB History    Gravida  2   Para  1   Term      Preterm  1   AB      Living  1     SAB      IAB      Ectopic      Multiple  0   Live Births  1           Past Medical History:  Diagnosis Date  . Constipation   . Gestational diabetes   . 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 Hypertension   . Migraine   . Thyroid disease     Past Surgical History:  Procedure Laterality Date  . CESAREAN SECTION N/A 04/10/2019   Procedure: CESAREAN SECTION;  Surgeon: PDonnamae Jude MD;  Location: [facility];  Service: Obstetrics;  Laterality: N/A;  . ESOPHAGOGASTRODUODENOSCOPY    . WISDOM TOOTH EXTRACTION       Family History  Problem Relation Age of Onset  . Migraines Father   . Hypertension Father     Social History   Tobacco Use  . Smoking status: Never Smoker  . Smokeless tobacco: Never Used  Vaping Use  . Vaping Use: Former  Substance Use Topics  . Alcohol use: No    Alcohol/week: 0.0 standard drinks  . Drug use: No    Allergies:  Allergies  Allergen Reactions  . Peanut-Containing Drug Products Anaphylaxis  . Penicillins Anaphylaxis, Hives and Other (See Comments)    Has patient had a PCN reaction causing immediate rash, facial/tongue/throat swelling, SOB or lightheadedness with hypotension: Yes Has patient had a PCN reaction causing severe rash involving mucus membranes or skin necrosis: No Has patient had a PCN reaction that required hospitalization No Has patient had a PCN reaction occurring within the last 10 years: No If all of the above answers are "NO", then may proceed with Cephalosporin use.   .Marland KitchenPeanut Oil Hives    Medications Prior to Admission  Medication Sig Dispense Refill Last Dose  . aspirin EC 81 MG tablet Take 81 mg by mouth every other  day. Swallow whole.   Past Week at Unknown time  . Butalbital-APAP-Caffeine 50-325-40 MG capsule Take 1-2 capsules by mouth every 6 (six) hours as needed for headache. 30 capsule 0 Past Month at Unknown time  . cyclobenzaprine (FLEXERIL) 10 MG tablet Take 1 tablet (10 mg total) by mouth 3 (three) times daily as needed for muscle spasms. 30 tablet 1 Past Month at Unknown time  . levothyroxine (SYNTHROID) 50 MCG tablet Take 1 tablet (50 mcg total) by mouth daily before breakfast. 30 tablet 1 12/11/2020 at Unknown time  . polyethylene glycol powder (GLYCOLAX/MIRALAX) 17 GM/SCOOP powder Take by mouth.   Past Week at Unknown time  . Prenat-FeAsp-Meth-FA-DHA w/o A (PRENATE DHA) 18-0.6-0.4-300 MG CAPS Take 1 capsule by mouth daily. 30 capsule 11 12/11/2020 at Unknown time  . promethazine (PHENERGAN) 25 MG tablet Take 1 tablet (25 mg  total) by mouth every 6 (six) hours as needed for nausea or vomiting. 60 tablet 2 Past Month at Unknown time  . AMBULATORY NON FORMULARY MEDICATION 1 Device by Other route once a week. [place] pressure cuff /Large  Monitored Regularly at home  ICD 10: HROB O09.90 1 kit 0   . bisacodyl (BISACODYL) 5 MG EC tablet Take 1 tablet (5 mg total) by mouth 2 (two) times daily. 30 tablet 0   . Elastic Bandages & Supports (COMFORT FIT MATERNITY SUPP MED) MISC 1 Device by Does not apply route daily. 1 each 1   . fluticasone (FLOVENT HFA) 110 MCG/ACT inhaler Inhale 2 puffs into the lungs daily. 1 each 12   . ipratropium-albuterol (DUONEB) 0.5-2.5 (3) MG/3ML SOLN Inhale into the lungs.     . pantoprazole (PROTONIX) 20 MG tablet Take 1 tablet (20 mg total) by mouth daily. 30 tablet 1     Review of Systems  Constitutional: Negative for chills and fever.  Gastrointestinal: Positive for abdominal pain. Negative for constipation (last BM 12/11/2020), diarrhea, nausea and vomiting.  Genitourinary: Negative for decreased urine volume, dysuria, flank pain, pelvic pain and vaginal bleeding.   Physical Exam   Blood pressure 134/89, pulse 62, temperature 98.4 F (36.9 C), temperature source Oral, resp. rate 12, weight 110.3 kg, last menstrual period 05/28/2020, SpO2 97 %, not currently breastfeeding.  Physical Exam Vitals and nursing note reviewed.  Constitutional:      General: She is not in acute distress. HENT:     Head: Normocephalic.  Eyes:     Pupils: Pupils are equal, round, and reactive to light.  Cardiovascular:     Rate and Rhythm: Normal rate.  Pulmonary:     Effort: Pulmonary effort is normal.  Abdominal:     Palpations: Abdomen is soft.     Tenderness: There is no abdominal tenderness.  Skin:    General: Skin is warm and dry.  Neurological:     Mental Status: She is alert and oriented to person, place, and time.  Psychiatric:        Mood and Affect: Mood normal.        Behavior: Behavior  normal.     NST:  Baseline: 140 Variability: moderate Accels: 10x10 Decels: none Toco: none Reactive/Appropriate for GA  Patient Vitals for the past 24 hrs:  BP Temp Temp src Pulse Resp SpO2 Weight  12/11/20 1931 134/89 - - 62 - - -  12/11/20 1916 (!) 142/84 - - 62 - - -  12/11/20 1901 (!) 148/91 - - 77 - - -  12/11/20 1846 133/87 - - 76 - - -  12/11/20 1831 (!) 156/96 - - 89 - - -  12/11/20 1816 (!) 144/95 - - 75 - - -  12/11/20 1801 (!) 152/101 - - 75 - - -  12/11/20 1746 133/77 - - 74 - - -  12/11/20 1731 130/87 - - 76 - - -  12/11/20 1716 133/87 - - 72 - - -  12/11/20 1701 (!) 137/93 - - 70 - - -  12/11/20 1700 (!) 137/93 - - 70 - 97 % -  12/11/20 1656 134/86 - - 69 - - -  12/11/20 1651 132/90 - - 64 - - -  12/11/20 1650 132/90 - - 64 - 97 % -  12/11/20 1635 (!) 145/89 98.4 F (36.9 C) Oral 69 12 99 % -  12/11/20 1621 - - - - - - 110.3 kg    Results for orders placed or performed during the hospital encounter of 12/11/20 (from the past 24 hour(s))  Resp Panel by RT-PCR (Flu A&B, Covid) Nasopharyngeal Swab     Status: Abnormal   Collection Time: 12/11/20  5:04 PM   Specimen: Nasopharyngeal Swab; Nasopharyngeal(NP) swabs in vial transport medium  Result Value Ref Range   SARS Coronavirus 2 by RT PCR POSITIVE (A) NEGATIVE   Influenza A by PCR NEGATIVE NEGATIVE   Influenza B by PCR NEGATIVE NEGATIVE  CBC with Differential/Platelet     Status: None   Collection Time: 12/11/20  5:33 PM  Result Value Ref Range   WBC 6.2 4.0 - 10.5 K/uL   RBC 4.06 3.87 - 5.11 MIL/uL   Hemoglobin 12.8 12.0 - 15.0 g/dL   HCT 36.0 36.0 - 46.0 %   MCV 88.7 80.0 - 100.0 fL   MCH 31.5 26.0 - 34.0 pg   MCHC 35.6 30.0 - 36.0 g/dL   RDW 12.9 11.5 - 15.5 %   Platelets 256 150 - 400 K/uL   nRBC 0.0 0.0 - 0.2 %   Neutrophils Relative % 45 %   Neutro Abs 2.8 1.7 - 7.7 K/uL   Lymphocytes Relative 46 %   Lymphs Abs 2.8 0.7 - 4.0 K/uL   Monocytes Relative 8 %   Monocytes Absolute 0.5 0.1 - 1.0  K/uL   Eosinophils Relative 1 %   Eosinophils Absolute 0.1 0.0 - 0.5 K/uL   Basophils Relative 0 %   Basophils Absolute 0.0 0.0 - 0.1 K/uL   Immature Granulocytes 0 %   Abs Immature Granulocytes 0.02 0.00 - 0.07 K/uL  Comprehensive metabolic panel     Status: Abnormal   Collection Time: 12/11/20  5:33 PM  Result Value Ref Range   Sodium 138 135 - 145 mmol/L   Potassium 3.9 3.5 - 5.1 mmol/L   Chloride 108 98 - 111 mmol/L   CO2 20 (L) 22 - 32 mmol/L   Glucose, Bld 77 70 - 99 mg/dL   BUN 6 6 - 20 mg/dL   Creatinine, Ser 0.57 0.44 - 1.00 mg/dL   Calcium 8.8 (L) 8.9 - 10.3 mg/dL   Total Protein 5.7 (L) 6.5 - 8.1 g/dL   Albumin 2.2 (L) 3.5 - 5.0 g/dL   AST 33 15 - 41 U/L   ALT 20 0 - 44 U/L   Alkaline Phosphatase 190 (H) 38 - 126 U/L   Total Bilirubin 0.3 0.3 - 1.2 mg/dL   GFR, Estimated >60 >60 mL/min   Anion gap 10 5 - 15  Urinalysis, Routine w reflex microscopic Urine, Clean Catch     Status: Abnormal  Collection Time: 12/11/20  7:13 PM  Result Value Ref Range   Color, Urine AMBER (A) YELLOW   APPearance HAZY (A) CLEAR   Specific Gravity, Urine 1.033 (H) 1.005 - 1.030   pH 6.0 5.0 - 8.0   Glucose, UA NEGATIVE NEGATIVE mg/dL   Hgb urine dipstick NEGATIVE NEGATIVE   Bilirubin Urine SMALL (A) NEGATIVE   Ketones, ur 5 (A) NEGATIVE mg/dL   Protein, ur >=300 (A) NEGATIVE mg/dL   Nitrite NEGATIVE NEGATIVE   Leukocytes,Ua SMALL (A) NEGATIVE   RBC / HPF 11-20 0 - 5 RBC/hpf   WBC, UA 21-50 0 - 5 WBC/hpf   Bacteria, UA RARE (A) NONE SEEN   Squamous Epithelial / LPF 11-20 0 - 5   Mucus PRESENT   Protein / creatinine ratio, urine     Status: Abnormal   Collection Time: 12/11/20  7:13 PM  Result Value Ref Range   Creatinine, Urine 509.05 mg/dL   Total Protein, Urine 744 mg/dL   Protein Creatinine Ratio 1.46 (H) 0.00 - 0.15 mg/mg[Cre]    MAU Course  Procedures  MDM 8:55 PM DW Dr. Rip Harbour, reviewed presentation, labs and patient history including seeing MFM for weekly doppler  studies. Patient with pre-eclampsia without severe features at this time. Will give BMZ today and have her return in 24 hours for 2nd BMZ and blood pressure check here. Patient has MFM appt on 1/12 and regular OB visit on 1/13. Will send message to MFM and [facility] office regarding current status and new pre-eclampsia diagnosis so they can make a plan regarding whether to continue those visits or convert to virtual if possible.   Assessment and Plan   1. Antepartum mild preeclampsia   2. [redacted] weeks gestation of pregnancy   3. COVID-19 affecting pregnancy in third trimester    DC home Pre-eclampsia warning signs reviewed  1st/2nd/3rd Trimester precautions  PTL precautions  Fetal kick counts RX: no new RX  Return to MAU in 24 hours for 2nd BMZ injection    Follow-up Information    [facility] Follow up.   Specialty: Obstetrics and Gynecology Why: Return here tomorrow evening around 9:00pm for steroid injection  Contact information: [address] [id number] [place] DNP, CNM  12/11/20  9:06 PM

## 2020-12-11 NOTE — MAU Note (Signed)
Pt reports for 2 days she has not been able to eat solids because every time she does she has severe abdominal pain.   Pt reports her stomach hurts even when drinking.   Pt reports using Pedialyte to stay hydrated, but this too causes pain.   Denies vaginal bleeding or LOF.   Pt reports waking up every two hours to make sure the baby is moving.   Pt reports normal fetal movement except in the last hour.

## 2020-12-12 ENCOUNTER — Other Ambulatory Visit: Payer: Self-pay

## 2020-12-12 ENCOUNTER — Inpatient Hospital Stay (HOSPITAL_COMMUNITY)
Admission: AD | Admit: 2020-12-12 | Discharge: 2020-12-12 | Disposition: A | Discharge status: Home / Self Care | Payer: Medicaid Other | Attending: Obstetrics and Gynecology | Admitting: Obstetrics and Gynecology

## 2020-12-12 DIAGNOSIS — Z3A28 28 weeks gestation of pregnancy: Secondary | ICD-10-CM | POA: Insufficient documentation

## 2020-12-12 DIAGNOSIS — U071 COVID-19: Secondary | ICD-10-CM | POA: Diagnosis not present

## 2020-12-12 DIAGNOSIS — O1403 Mild to moderate pre-eclampsia, third trimester: Secondary | ICD-10-CM | POA: Insufficient documentation

## 2020-12-12 DIAGNOSIS — O98513 Other viral diseases complicating pregnancy, third trimester: Secondary | ICD-10-CM | POA: Diagnosis not present

## 2020-12-12 DIAGNOSIS — O1400 Mild to moderate pre-eclampsia, unspecified trimester: Secondary | ICD-10-CM

## 2020-12-12 MED ORDER — BETAMETHASONE SOD PHOS & ACET 6 (3-3) MG/ML IJ SUSP
12.0000 mg | Freq: Once | INTRAMUSCULAR | Status: AC
  Administered 2020-12-12: 12 mg via INTRAMUSCULAR

## 2020-12-12 NOTE — MAU Provider Note (Cosign Needed)
Event Date/Time   First Provider Initiated Contact with Patient 12/12/20 2048      S Ms. Nichole Bush is a 25 y.o. G2P0101 patient who presents to MAU today for 2nd BMZ. She was diagnosed with pre-eclampsia without severe features yesterday as well as Covid-19 infection. She denies any contractions, VB or LOF. She reports normal fetal movement. She has had some mild headache, but they resolve with tylenol. Likely related to covid-19.   O BP 129/73 (BP Location: Right Arm)   Pulse 97   Temp 98.5 F (36.9 C) (Oral)   Resp 15   Ht 5\' 2"  (1.575 m)   Wt 112.9 kg   LMP 05/28/2020 (Approximate)   SpO2 99%   BMI 45.51 kg/m  Physical Exam Vitals and nursing note reviewed.  Constitutional:      General: She is not in acute distress. HENT:     Head: Normocephalic.  Eyes:     Pupils: Pupils are equal, round, and reactive to light.  Cardiovascular:     Rate and Rhythm: Normal rate.  Pulmonary:     Effort: Pulmonary effort is normal.  Neurological:     Mental Status: She is alert and oriented to person, place, and time.  Psychiatric:        Mood and Affect: Mood normal.        Behavior: Behavior normal.    +FHT 145 with doppler   A Medical screening exam complete 1. Antepartum mild preeclampsia   2. [redacted] weeks gestation of pregnancy   3. COVID-19 virus infection      P Discharge from MAU in stable condition Warning signs for worsening condition that would warrant emergency follow-up discussed Pre-eclampsia warning signs Covid-19 infection warning signs  Patient may return to MAU as needed   05/30/2020 DNP, CNM  12/12/20  8:50 PM

## 2020-12-12 NOTE — MAU Note (Signed)
.  Nichole Bush is a 25 y.o. at [redacted]w[redacted]d here in MAU reporting for her second betamethasone injection. Patient denies contractions, vaginal bleeding, or leaking of fluid. +FM.  Pain score: 0/10 Vitals:   12/12/20 2014  BP: 129/73  Pulse: 97  Resp: 15  Temp: 98.5 F (36.9 C)  SpO2: 99%     FHT: 145 doppler

## 2020-12-13 MED ORDER — ONDANSETRON 4 MG PO TBDP: 4.0000 mg | ORAL_TABLET | Freq: Four times a day (QID) | ORAL | 0 refills | Status: DC | PRN

## 2020-12-15 ENCOUNTER — Other Ambulatory Visit: Payer: Self-pay

## 2020-12-15 ENCOUNTER — Ambulatory Visit: Payer: Medicaid Other | Attending: Obstetrics and Gynecology

## 2020-12-15 ENCOUNTER — Other Ambulatory Visit: Payer: Self-pay | Admitting: Obstetrics

## 2020-12-15 ENCOUNTER — Ambulatory Visit: Payer: Medicaid Other | Admitting: *Deleted

## 2020-12-15 ENCOUNTER — Ambulatory Visit: Payer: Medicaid Other

## 2020-12-15 DIAGNOSIS — O365930 Maternal care for other known or suspected poor fetal growth, third trimester, not applicable or unspecified: Secondary | ICD-10-CM

## 2020-12-15 DIAGNOSIS — Z3A28 28 weeks gestation of pregnancy: Secondary | ICD-10-CM

## 2020-12-15 DIAGNOSIS — E079 Disorder of thyroid, unspecified: Secondary | ICD-10-CM

## 2020-12-15 DIAGNOSIS — E669 Obesity, unspecified: Secondary | ICD-10-CM

## 2020-12-15 DIAGNOSIS — J45909 Unspecified asthma, uncomplicated: Secondary | ICD-10-CM

## 2020-12-15 DIAGNOSIS — O365990 Maternal care for other known or suspected poor fetal growth, unspecified trimester, not applicable or unspecified: Secondary | ICD-10-CM | POA: Insufficient documentation

## 2020-12-15 DIAGNOSIS — Z3480 Encounter for supervision of other normal pregnancy, unspecified trimester: Secondary | ICD-10-CM

## 2020-12-15 DIAGNOSIS — O34219 Maternal care for unspecified type scar from previous cesarean delivery: Secondary | ICD-10-CM | POA: Diagnosis not present

## 2020-12-15 DIAGNOSIS — O99283 Endocrine, nutritional and metabolic diseases complicating pregnancy, third trimester: Secondary | ICD-10-CM

## 2020-12-15 DIAGNOSIS — O09293 Supervision of pregnancy with other poor reproductive or obstetric history, third trimester: Secondary | ICD-10-CM

## 2020-12-15 DIAGNOSIS — O99213 Obesity complicating pregnancy, third trimester: Secondary | ICD-10-CM

## 2020-12-15 DIAGNOSIS — E039 Hypothyroidism, unspecified: Secondary | ICD-10-CM | POA: Diagnosis not present

## 2020-12-15 DIAGNOSIS — O283 Abnormal ultrasonic finding on antenatal screening of mother: Secondary | ICD-10-CM | POA: Diagnosis present

## 2020-12-15 DIAGNOSIS — O09893 Supervision of other high risk pregnancies, third trimester: Secondary | ICD-10-CM

## 2020-12-15 DIAGNOSIS — O365931 Maternal care for other known or suspected poor fetal growth, third trimester, fetus 1: Secondary | ICD-10-CM | POA: Insufficient documentation

## 2020-12-15 DIAGNOSIS — Z363 Encounter for antenatal screening for malformations: Secondary | ICD-10-CM

## 2020-12-15 DIAGNOSIS — O99513 Diseases of the respiratory system complicating pregnancy, third trimester: Secondary | ICD-10-CM

## 2020-12-15 NOTE — Procedures (Signed)
Nichole Bush [birth year] [redacted]w[redacted]d  Fetus A Non-Stress Test Interpretation for 12/15/20  Indication: IUGR  Fetal Heart Rate A Mode: External Baseline Rate (A): 135 bpm Variability: Minimal,Moderate Accelerations: 10 x 10 Decelerations: None Multiple birth?: No  Uterine Activity Mode: Palpation,Toco Contraction Frequency (min): none noted Resting Tone Palpated: Relaxed Resting Time: Adequate  Interpretation (Fetal Testing) Nonstress Test Interpretation: Non-reactive Comments: Reviewed tracing with Dr. Grace Bushy, Pt placed in room for BPP

## 2020-12-16 ENCOUNTER — Encounter: Payer: Medicaid Other | Admitting: Family Medicine

## 2020-12-17 ENCOUNTER — Encounter: Payer: Self-pay | Admitting: *Deleted

## 2020-12-17 ENCOUNTER — Ambulatory Visit: Payer: Medicaid Other | Admitting: *Deleted

## 2020-12-17 ENCOUNTER — Ambulatory Visit: Payer: Medicaid Other | Attending: Obstetrics and Gynecology | Admitting: *Deleted

## 2020-12-17 ENCOUNTER — Other Ambulatory Visit: Payer: Self-pay

## 2020-12-17 DIAGNOSIS — O365990 Maternal care for other known or suspected poor fetal growth, unspecified trimester, not applicable or unspecified: Secondary | ICD-10-CM

## 2020-12-17 DIAGNOSIS — O365930 Maternal care for other known or suspected poor fetal growth, third trimester, not applicable or unspecified: Secondary | ICD-10-CM | POA: Diagnosis present

## 2020-12-17 DIAGNOSIS — O283 Abnormal ultrasonic finding on antenatal screening of mother: Secondary | ICD-10-CM

## 2020-12-17 DIAGNOSIS — Z3A29 29 weeks gestation of pregnancy: Secondary | ICD-10-CM | POA: Diagnosis not present

## 2020-12-17 DIAGNOSIS — Z3480 Encounter for supervision of other normal pregnancy, unspecified trimester: Secondary | ICD-10-CM

## 2020-12-17 NOTE — Procedures (Signed)
Nichole Bush 11-Oct-1996 [redacted]w[redacted]d  Fetus A Non-Stress Test Interpretation for 12/17/20  Indication: IUGR  Fetal Heart Rate A Mode: External Baseline Rate (A): 140 bpm Variability: Moderate Accelerations: 10 x 10 Decelerations: None Multiple birth?: No  Uterine Activity Mode: Palpation (Not able to use toco due to fetal activity and holding for FHR) Contraction Frequency (min): Pt reports no U/C's Resting Tone Palpated: Relaxed Resting Time: Adequate  Interpretation (Fetal Testing) Nonstress Test Interpretation: Reactive Overall Impression: Reassuring for gestational age Comments: Reviewed tracing with Dr. Judeth Cornfield

## 2020-12-20 ENCOUNTER — Other Ambulatory Visit: Payer: Medicaid Other

## 2020-12-20 ENCOUNTER — Encounter (HOSPITAL_COMMUNITY): Payer: Self-pay | Admitting: Obstetrics and Gynecology

## 2020-12-20 ENCOUNTER — Other Ambulatory Visit: Payer: Self-pay

## 2020-12-20 ENCOUNTER — Inpatient Hospital Stay (HOSPITAL_COMMUNITY)
Admission: AD | Admit: 2020-12-20 | Discharge: 2020-12-20 | Disposition: A | Discharge status: Home / Self Care | Payer: Medicaid Other | Attending: Family Medicine | Admitting: Family Medicine

## 2020-12-20 ENCOUNTER — Ambulatory Visit: Payer: Medicaid Other

## 2020-12-20 DIAGNOSIS — O365930 Maternal care for other known or suspected poor fetal growth, third trimester, not applicable or unspecified: Secondary | ICD-10-CM | POA: Insufficient documentation

## 2020-12-20 DIAGNOSIS — U071 COVID-19: Secondary | ICD-10-CM | POA: Diagnosis not present

## 2020-12-20 DIAGNOSIS — Z8616 Personal history of COVID-19: Secondary | ICD-10-CM | POA: Insufficient documentation

## 2020-12-20 DIAGNOSIS — O283 Abnormal ultrasonic finding on antenatal screening of mother: Secondary | ICD-10-CM

## 2020-12-20 DIAGNOSIS — Z3A29 29 weeks gestation of pregnancy: Secondary | ICD-10-CM

## 2020-12-20 DIAGNOSIS — Z3480 Encounter for supervision of other normal pregnancy, unspecified trimester: Secondary | ICD-10-CM

## 2020-12-20 DIAGNOSIS — E039 Hypothyroidism, unspecified: Secondary | ICD-10-CM | POA: Diagnosis not present

## 2020-12-20 DIAGNOSIS — O98513 Other viral diseases complicating pregnancy, third trimester: Secondary | ICD-10-CM | POA: Diagnosis not present

## 2020-12-20 DIAGNOSIS — O365990 Maternal care for other known or suspected poor fetal growth, unspecified trimester, not applicable or unspecified: Secondary | ICD-10-CM

## 2020-12-20 DIAGNOSIS — Z880 Allergy status to penicillin: Secondary | ICD-10-CM | POA: Diagnosis not present

## 2020-12-20 DIAGNOSIS — O99283 Endocrine, nutritional and metabolic diseases complicating pregnancy, third trimester: Secondary | ICD-10-CM | POA: Diagnosis not present

## 2020-12-20 DIAGNOSIS — R262 Difficulty in walking, not elsewhere classified: Secondary | ICD-10-CM | POA: Diagnosis not present

## 2020-12-20 DIAGNOSIS — O1493 Unspecified pre-eclampsia, third trimester: Secondary | ICD-10-CM | POA: Diagnosis not present

## 2020-12-20 DIAGNOSIS — O1203 Gestational edema, third trimester: Secondary | ICD-10-CM | POA: Diagnosis present

## 2020-12-20 LAB — COMPREHENSIVE METABOLIC PANEL
ALT: 13 U/L (ref 0–44)
AST: 20 U/L (ref 15–41)
Albumin: 1.7 g/dL — ABNORMAL LOW (ref 3.5–5.0)
Alkaline Phosphatase: 175 U/L — ABNORMAL HIGH (ref 38–126)
Anion gap: 10 (ref 5–15)
BUN: 11 mg/dL (ref 6–20)
CO2: 22 mmol/L (ref 22–32)
Calcium: 8.8 mg/dL — ABNORMAL LOW (ref 8.9–10.3)
Chloride: 107 mmol/L (ref 98–111)
Creatinine, Ser: 0.78 mg/dL (ref 0.44–1.00)
GFR, Estimated: >60 mL/min (ref >60)
Glucose, Bld: 98 mg/dL (ref 70–99)
Potassium: 3.7 mmol/L (ref 3.5–5.1)
Sodium: 139 mmol/L (ref 135–145)
Total Bilirubin: 0.5 mg/dL (ref 0.3–1.2)
Total Protein: 4.6 g/dL — ABNORMAL LOW (ref 6.5–8.1)

## 2020-12-20 LAB — CBC
HCT: 35.4 % — ABNORMAL LOW (ref 36.0–46.0)
Hemoglobin: 12.5 g/dL (ref 12.0–15.0)
MCH: 31.6 pg (ref 26.0–34.0)
MCHC: 35.3 g/dL (ref 30.0–36.0)
MCV: 89.6 fL (ref 80.0–100.0)
Platelets: 217 10*3/uL (ref 150–400)
RBC: 3.95 MIL/uL (ref 3.87–5.11)
RDW: 13.4 % (ref 11.5–15.5)
WBC: 8.4 10*3/uL (ref 4.0–10.5)
nRBC: 0.4 % — ABNORMAL HIGH (ref 0.0–0.2)

## 2020-12-20 LAB — URINALYSIS, ROUTINE W REFLEX MICROSCOPIC
Bilirubin Urine: NEGATIVE
Glucose, UA: NEGATIVE mg/dL
Hgb urine dipstick: NEGATIVE
Ketones, ur: NEGATIVE mg/dL
Leukocytes,Ua: NEGATIVE
Nitrite: NEGATIVE
Protein, ur: >=300 mg/dL — AB
Specific Gravity, Urine: 1.034 — ABNORMAL HIGH (ref 1.005–1.030)
pH: 6 (ref 5.0–8.0)

## 2020-12-20 MED ORDER — MEDICAL COMPRESSION STOCKINGS MISC: 2.0000 | 0 refills | Status: DC | PRN

## 2020-12-20 NOTE — MAU Note (Signed)
Pt reports she feels like the swelling in he feet and legs has gotten much worse since yesterday. Having trouble walking. Feels like her hands and face are more swollen as well . Pt told she was preeclamptic. Denies any headache or blurred vision. Baby does not move  Lot normally.  Denies any vag bleeding or leaking a this time.

## 2020-12-20 NOTE — MAU Provider Note (Signed)
History     CSN: [id number]  Arrival date and time: 12/20/20 1033   Event Date/Time   First Provider Initiated Contact with Patient 12/20/20 1148      Chief Complaint  Patient presents with  . Leg Swelling   HPI This is a 25 yo G2P0101 at [redacted]w[redacted]d Pregnancy complicated by hypothyroidism, history of preeclampsia, history of cesarean delivery, UKoreawith fetal pericardial effusion, FGR with AEDF, h/o COVID. She was diagnosed with preeclampsia without severe features on 12/11/20. She presents today with increased swelling in her legs and feet over the past few days. Has been worsening. Having difficulty walking and moving. Denies headache, scotoma.   No leaking fluid, vaginal bleeding. Baby doesn't move a lot, but movements are not decreased.  OB History    Gravida  2   Para  1   Term      Preterm  1   AB      Living  1     SAB      IAB      Ectopic      Multiple  0   Live Births  1           Past Medical History:  Diagnosis Date  . Constipation   . Gestational diabetes   . 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 Hypertension   . Migraine   . Thyroid disease     Past Surgical History:  Procedure Laterality Date  . CESAREAN SECTION N/A 04/10/2019   Procedure: CESAREAN SECTION;  Surgeon: PDonnamae Jude MD;  Location: [facility];  Service: Obstetrics;  Laterality: N/A;  . ESOPHAGOGASTRODUODENOSCOPY    . WISDOM TOOTH EXTRACTION      Family History  Problem Relation Age of Onset  . Migraines Father   . Hypertension Father     Social History   Tobacco Use  . Smoking status: Never Smoker  . Smokeless tobacco: Never Used  Vaping Use  . Vaping Use: Former  . Substances: CBD  Substance Use Topics  . Alcohol use: No    Alcohol/week: 0.0 standard drinks  . Drug use: No    Allergies:  Allergies  Allergen Reactions  . Peanut-Containing Drug Products Anaphylaxis  . Penicillins Anaphylaxis, Hives and Other (See Comments)    Has patient had a PCN  reaction causing immediate rash, facial/tongue/throat swelling, SOB or lightheadedness with hypotension: Yes Has patient had a PCN reaction causing severe rash involving mucus membranes or skin necrosis: No Has patient had a PCN reaction that required hospitalization No Has patient had a PCN reaction occurring within the last 10 years: No If all of the above answers are "NO", then may proceed with Cephalosporin use.   .Marland KitchenPeanut Oil Hives    Medications Prior to Admission  Medication Sig Dispense Refill Last Dose  . aspirin EC 81 MG tablet Take 81 mg by mouth every other day. Swallow whole.   12/19/2020 at Unknown time  . levothyroxine (SYNTHROID) 50 MCG tablet Take 1 tablet (50 mcg total) by mouth daily before breakfast. 30 tablet 1 12/19/2020 at Unknown time  . ondansetron (ZOFRAN ODT) 4 MG disintegrating tablet Take 1 tablet (4 mg total) by mouth every 6 (six) hours as needed for nausea. 20 tablet 0 Past Month at Unknown time  . Prenat-FeAsp-Meth-FA-DHA w/o A (PRENATE DHA) 18-0.6-0.4-300 MG CAPS Take 1 capsule by mouth daily. 30 capsule 11 12/19/2020 at Unknown time  . AMBULATORY NON FORMULARY MEDICATION 1 Device by Other route  once a week. [place] pressure cuff /Large  Monitored Regularly at home  ICD 10: HROB O09.90 1 kit 0   . bisacodyl (BISACODYL) 5 MG EC tablet Take 1 tablet (5 mg total) by mouth 2 (two) times daily. 30 tablet 0   . Butalbital-APAP-Caffeine 50-325-40 MG capsule Take 1-2 capsules by mouth every 6 (six) hours as needed for headache. 30 capsule 0   . cyclobenzaprine (FLEXERIL) 10 MG tablet Take 1 tablet (10 mg total) by mouth 3 (three) times daily as needed for muscle spasms. 30 tablet 1   . Elastic Bandages & Supports (COMFORT FIT MATERNITY SUPP MED) MISC 1 Device by Does not apply route daily. 1 each 1   . fluticasone (FLOVENT HFA) 110 MCG/ACT inhaler Inhale 2 puffs into the lungs daily. 1 each 12   . ipratropium-albuterol (DUONEB) 0.5-2.5 (3) MG/3ML SOLN Inhale into the  lungs.     . pantoprazole (PROTONIX) 20 MG tablet Take 1 tablet (20 mg total) by mouth daily. 30 tablet 1   . polyethylene glycol powder (GLYCOLAX/MIRALAX) 17 GM/SCOOP powder Take by mouth.     . promethazine (PHENERGAN) 25 MG tablet Take 1 tablet (25 mg total) by mouth every 6 (six) hours as needed for nausea or vomiting. 60 tablet 2     Review of Systems Physical Exam   Blood pressure 132/88, pulse 82, temperature 98.3 F (36.8 C), temperature source Oral, resp. rate 18, height $RemoveBe'5\' 2"'FjBKtiAJR$  (1.575 m), weight 125.2 kg, last menstrual period 05/28/2020, not currently breastfeeding.  Physical Exam Vitals reviewed.  Constitutional:      Appearance: Normal appearance.  HENT:     Head: Normocephalic and atraumatic.  Cardiovascular:     Rate and Rhythm: Normal rate and regular rhythm.     Pulses: Normal pulses.  Pulmonary:     Effort: Pulmonary effort is normal.     Breath sounds: Normal breath sounds.  Abdominal:     General: Abdomen is flat.     Palpations: Abdomen is soft.  Musculoskeletal:     Right lower leg: 2+ Pitting Edema present.     Left lower leg: 2+ Pitting Edema present.  Skin:    General: Skin is warm and dry.     Capillary Refill: Capillary refill takes less than 2 seconds.  Neurological:     General: No focal deficit present.     Mental Status: She is alert.  Psychiatric:        Mood and Affect: Mood normal.        Behavior: Behavior normal.        Thought Content: Thought content normal.        Judgment: Judgment normal.    Results for orders placed or performed during the hospital encounter of 12/20/20 (from the past 24 hour(s))  Urinalysis, Routine w reflex microscopic Urine, Clean Catch     Status: Abnormal   Collection Time: 12/20/20 11:07 AM  Result Value Ref Range   Color, Urine AMBER (A) YELLOW   APPearance CLOUDY (A) CLEAR   Specific Gravity, Urine 1.034 (H) 1.005 - 1.030   pH 6.0 5.0 - 8.0   Glucose, UA NEGATIVE NEGATIVE mg/dL   Hgb urine dipstick  NEGATIVE NEGATIVE   Bilirubin Urine NEGATIVE NEGATIVE   Ketones, ur NEGATIVE NEGATIVE mg/dL   Protein, ur >=300 (A) NEGATIVE mg/dL   Nitrite NEGATIVE NEGATIVE   Leukocytes,Ua NEGATIVE NEGATIVE   RBC / HPF 0-5 0 - 5 RBC/hpf   WBC, UA 11-20 0 - 5 WBC/hpf  Bacteria, UA FEW (A) NONE SEEN   Squamous Epithelial / LPF 11-20 0 - 5   Hyaline Casts, UA PRESENT   CBC     Status: Abnormal   Collection Time: 12/20/20 11:32 AM  Result Value Ref Range   WBC 8.4 4.0 - 10.5 K/uL   RBC 3.95 3.87 - 5.11 MIL/uL   Hemoglobin 12.5 12.0 - 15.0 g/dL   HCT 35.4 (L) 36.0 - 46.0 %   MCV 89.6 80.0 - 100.0 fL   MCH 31.6 26.0 - 34.0 pg   MCHC 35.3 30.0 - 36.0 g/dL   RDW 13.4 11.5 - 15.5 %   Platelets 217 150 - 400 K/uL   nRBC 0.4 (H) 0.0 - 0.2 %  Comprehensive metabolic panel     Status: Abnormal   Collection Time: 12/20/20 11:32 AM  Result Value Ref Range   Sodium 139 135 - 145 mmol/L   Potassium 3.7 3.5 - 5.1 mmol/L   Chloride 107 98 - 111 mmol/L   CO2 22 22 - 32 mmol/L   Glucose, Bld 98 70 - 99 mg/dL   BUN 11 6 - 20 mg/dL   Creatinine, Ser 0.78 0.44 - 1.00 mg/dL   Calcium 8.8 (L) 8.9 - 10.3 mg/dL   Total Protein 4.6 (L) 6.5 - 8.1 g/dL   Albumin 1.7 (L) 3.5 - 5.0 g/dL   AST 20 15 - 41 U/L   ALT 13 0 - 44 U/L   Alkaline Phosphatase 175 (H) 38 - 126 U/L   Total Bilirubin 0.5 0.3 - 1.2 mg/dL   GFR, Estimated >60 >60 mL/min   Anion gap 10 5 - 15     MAU Course  Procedures NST baseline 130s. Moderate variability, 10x10 accels, no decels.   MDM   Assessment and Plan     ICD-10-CM   1. [redacted] weeks gestation of pregnancy  Z3A.29   2. Abnormal fetal ultrasound  O28.3   3. Supervision of other normal pregnancy, antepartum  Z34.80   4. Pregnancy affected by fetal growth restriction  O36.5990   5. COVID-19 affecting pregnancy in third trimester  O98.513    U07.1   6. Preeclampsia, third trimester  O14.93    Compression stockings.  Mild range BPs NST reassuring for GA Has appt  tomorrow.  Truett Mainland 12/20/2020, 11:54 AM

## 2020-12-21 ENCOUNTER — Ambulatory Visit: Payer: Medicaid Other

## 2020-12-21 ENCOUNTER — Other Ambulatory Visit: Payer: Medicaid Other

## 2020-12-21 ENCOUNTER — Other Ambulatory Visit: Payer: Self-pay | Admitting: Obstetrics & Gynecology

## 2020-12-21 ENCOUNTER — Encounter: Payer: Medicaid Other | Admitting: Advanced Practice Midwife

## 2020-12-22 ENCOUNTER — Ambulatory Visit (INDEPENDENT_AMBULATORY_CARE_PROVIDER_SITE_OTHER): Payer: Medicaid Other | Admitting: Obstetrics & Gynecology

## 2020-12-22 ENCOUNTER — Inpatient Hospital Stay (HOSPITAL_COMMUNITY)
Admission: AD | Admit: 2020-12-22 | Discharge: 2020-12-26 | DRG: 785 | Disposition: A | Discharge status: Home / Self Care | Payer: Medicaid Other | Attending: Obstetrics and Gynecology | Admitting: Obstetrics and Gynecology

## 2020-12-22 ENCOUNTER — Other Ambulatory Visit: Payer: Self-pay

## 2020-12-22 ENCOUNTER — Encounter (HOSPITAL_COMMUNITY): Payer: Self-pay | Admitting: Obstetrics and Gynecology

## 2020-12-22 ENCOUNTER — Inpatient Hospital Stay (HOSPITAL_BASED_OUTPATIENT_CLINIC_OR_DEPARTMENT_OTHER): Payer: Medicaid Other

## 2020-12-22 VITALS — BP 159/116 | HR 79 | Wt 279.0 lb

## 2020-12-22 DIAGNOSIS — O34219 Maternal care for unspecified type scar from previous cesarean delivery: Secondary | ICD-10-CM

## 2020-12-22 DIAGNOSIS — R55 Syncope and collapse: Secondary | ICD-10-CM | POA: Diagnosis not present

## 2020-12-22 DIAGNOSIS — O99893 Other specified diseases and conditions complicating puerperium: Secondary | ICD-10-CM | POA: Diagnosis not present

## 2020-12-22 DIAGNOSIS — O169 Unspecified maternal hypertension, unspecified trimester: Secondary | ICD-10-CM | POA: Diagnosis not present

## 2020-12-22 DIAGNOSIS — E039 Hypothyroidism, unspecified: Secondary | ICD-10-CM

## 2020-12-22 DIAGNOSIS — O34211 Maternal care for low transverse scar from previous cesarean delivery: Secondary | ICD-10-CM | POA: Diagnosis present

## 2020-12-22 DIAGNOSIS — J45909 Unspecified asthma, uncomplicated: Secondary | ICD-10-CM

## 2020-12-22 DIAGNOSIS — Z880 Allergy status to penicillin: Secondary | ICD-10-CM

## 2020-12-22 DIAGNOSIS — Z302 Encounter for sterilization: Secondary | ICD-10-CM

## 2020-12-22 DIAGNOSIS — Z3A29 29 weeks gestation of pregnancy: Secondary | ICD-10-CM

## 2020-12-22 DIAGNOSIS — Z23 Encounter for immunization: Secondary | ICD-10-CM | POA: Diagnosis not present

## 2020-12-22 DIAGNOSIS — K2100 Gastro-esophageal reflux disease with esophagitis, without bleeding: Secondary | ICD-10-CM | POA: Diagnosis present

## 2020-12-22 DIAGNOSIS — O1410 Severe pre-eclampsia, unspecified trimester: Secondary | ICD-10-CM | POA: Diagnosis present

## 2020-12-22 DIAGNOSIS — O9962 Diseases of the digestive system complicating childbirth: Secondary | ICD-10-CM | POA: Diagnosis present

## 2020-12-22 DIAGNOSIS — O1414 Severe pre-eclampsia complicating childbirth: Principal | ICD-10-CM | POA: Diagnosis present

## 2020-12-22 DIAGNOSIS — Z3480 Encounter for supervision of other normal pregnancy, unspecified trimester: Secondary | ICD-10-CM | POA: Diagnosis not present

## 2020-12-22 DIAGNOSIS — O09299 Supervision of pregnancy with other poor reproductive or obstetric history, unspecified trimester: Secondary | ICD-10-CM

## 2020-12-22 DIAGNOSIS — O1493 Unspecified pre-eclampsia, third trimester: Secondary | ICD-10-CM

## 2020-12-22 DIAGNOSIS — O365930 Maternal care for other known or suspected poor fetal growth, third trimester, not applicable or unspecified: Secondary | ICD-10-CM | POA: Diagnosis not present

## 2020-12-22 DIAGNOSIS — Z8616 Personal history of COVID-19: Secondary | ICD-10-CM | POA: Diagnosis not present

## 2020-12-22 DIAGNOSIS — O1413 Severe pre-eclampsia, third trimester: Secondary | ICD-10-CM | POA: Diagnosis not present

## 2020-12-22 DIAGNOSIS — O283 Abnormal ultrasonic finding on antenatal screening of mother: Secondary | ICD-10-CM

## 2020-12-22 DIAGNOSIS — O322XX0 Maternal care for transverse and oblique lie, not applicable or unspecified: Secondary | ICD-10-CM | POA: Diagnosis not present

## 2020-12-22 DIAGNOSIS — O99284 Endocrine, nutritional and metabolic diseases complicating childbirth: Secondary | ICD-10-CM | POA: Diagnosis present

## 2020-12-22 DIAGNOSIS — O09293 Supervision of pregnancy with other poor reproductive or obstetric history, third trimester: Secondary | ICD-10-CM

## 2020-12-22 DIAGNOSIS — O365990 Maternal care for other known or suspected poor fetal growth, unspecified trimester, not applicable or unspecified: Secondary | ICD-10-CM

## 2020-12-22 DIAGNOSIS — O99283 Endocrine, nutritional and metabolic diseases complicating pregnancy, third trimester: Secondary | ICD-10-CM | POA: Diagnosis not present

## 2020-12-22 DIAGNOSIS — Z98891 History of uterine scar from previous surgery: Secondary | ICD-10-CM

## 2020-12-22 DIAGNOSIS — O99214 Obesity complicating childbirth: Secondary | ICD-10-CM | POA: Diagnosis present

## 2020-12-22 DIAGNOSIS — O368930 Maternal care for other specified fetal problems, third trimester, not applicable or unspecified: Secondary | ICD-10-CM

## 2020-12-22 DIAGNOSIS — Z7982 Long term (current) use of aspirin: Secondary | ICD-10-CM

## 2020-12-22 DIAGNOSIS — O99513 Diseases of the respiratory system complicating pregnancy, third trimester: Secondary | ICD-10-CM

## 2020-12-22 LAB — COMPREHENSIVE METABOLIC PANEL
ALT: 17 U/L (ref 0–44)
AST: 27 U/L (ref 15–41)
Albumin: 1.6 g/dL — ABNORMAL LOW (ref 3.5–5.0)
Alkaline Phosphatase: 181 U/L — ABNORMAL HIGH (ref 38–126)
Anion gap: 9 (ref 5–15)
BUN: 6 mg/dL (ref 6–20)
CO2: 21 mmol/L — ABNORMAL LOW (ref 22–32)
Calcium: 8.4 mg/dL — ABNORMAL LOW (ref 8.9–10.3)
Chloride: 109 mmol/L (ref 98–111)
Creatinine, Ser: 0.63 mg/dL (ref 0.44–1.00)
GFR, Estimated: >60 mL/min (ref >60)
Glucose, Bld: 107 mg/dL — ABNORMAL HIGH (ref 70–99)
Potassium: 3.7 mmol/L (ref 3.5–5.1)
Sodium: 139 mmol/L (ref 135–145)
Total Bilirubin: 0.5 mg/dL (ref 0.3–1.2)
Total Protein: 4.8 g/dL — ABNORMAL LOW (ref 6.5–8.1)

## 2020-12-22 LAB — CBC
HCT: 38.4 % (ref 36.0–46.0)
Hemoglobin: 12.8 g/dL (ref 12.0–15.0)
MCH: 30.8 pg (ref 26.0–34.0)
MCHC: 33.3 g/dL (ref 30.0–36.0)
MCV: 92.3 fL (ref 80.0–100.0)
Platelets: 191 10*3/uL (ref 150–400)
RBC: 4.16 MIL/uL (ref 3.87–5.11)
RDW: 13.6 % (ref 11.5–15.5)
WBC: 9 10*3/uL (ref 4.0–10.5)
nRBC: 0.2 % (ref 0.0–0.2)

## 2020-12-22 LAB — POCT URINALYSIS DIPSTICK OB
Bilirubin, UA: NEGATIVE
Glucose, UA: NEGATIVE
Ketones, UA: NEGATIVE
Leukocytes, UA: NEGATIVE
Nitrite, UA: NEGATIVE
Spec Grav, UA: 1.015 (ref 1.010–1.025)
Urobilinogen, UA: 0.2 E.U./dL
pH, UA: 7 (ref 5.0–8.0)

## 2020-12-22 LAB — TYPE AND SCREEN
ABO/RH(D): B POS
Antibody Screen: NEGATIVE

## 2020-12-22 LAB — PROTEIN / CREATININE RATIO, URINE
Creatinine, Urine: 169.65 mg/dL
Protein Creatinine Ratio: 13.7 mg/mg{Cre} — ABNORMAL HIGH (ref 0.00–0.15)
Total Protein, Urine: 2324 mg/dL

## 2020-12-22 MED ORDER — ACETAMINOPHEN 500 MG PO TABS
1000.0000 mg | ORAL_TABLET | Freq: Once | ORAL | Status: AC
  Administered 2020-12-22: 1000 mg via ORAL
  Filled 2020-12-22: qty 2

## 2020-12-22 MED ORDER — BETAMETHASONE SOD PHOS & ACET 6 (3-3) MG/ML IJ SUSP
12.0000 mg | INTRAMUSCULAR | Status: AC
  Administered 2020-12-22 – 2020-12-23 (×2): 12 mg via INTRAMUSCULAR
  Filled 2020-12-22: qty 5

## 2020-12-22 MED ORDER — MAGNESIUM SULFATE 40 GM/1000ML IV SOLN
INTRAVENOUS | Status: AC
  Administered 2020-12-22: 4 g via INTRAVENOUS
  Filled 2020-12-22: qty 1000

## 2020-12-22 MED ORDER — ACETAMINOPHEN 325 MG PO TABS
650.0000 mg | ORAL_TABLET | ORAL | Status: DC | PRN
  Administered 2020-12-22 – 2020-12-23 (×2): 650 mg via ORAL
  Filled 2020-12-22 (×2): qty 2

## 2020-12-22 MED ORDER — PRENATAL MULTIVITAMIN CH: 1.0000 | ORAL_TABLET | Freq: Every day | ORAL | Status: DC

## 2020-12-22 MED ORDER — ENOXAPARIN SODIUM 60 MG/0.6ML ~~LOC~~ SOLN
60.0000 mg | SUBCUTANEOUS | Status: DC
  Filled 2020-12-22: qty 0.6

## 2020-12-22 MED ORDER — DOCUSATE SODIUM 100 MG PO CAPS: 100.0000 mg | ORAL_CAPSULE | Freq: Two times a day (BID) | ORAL | Status: DC | PRN

## 2020-12-22 MED ORDER — CALCIUM GLUCONATE-NACL 1-0.675 GM/50ML-% IV SOLN
INTRAVENOUS | Status: AC
  Administered 2020-12-22: 1000 mg
  Filled 2020-12-22: qty 50

## 2020-12-22 MED ORDER — LEVOTHYROXINE SODIUM 25 MCG PO TABS
50.0000 ug | ORAL_TABLET | Freq: Every day | ORAL | Status: DC
  Administered 2020-12-24 – 2020-12-26 (×3): 50 ug via ORAL
  Filled 2020-12-22 (×3): qty 2

## 2020-12-22 MED ORDER — LACTATED RINGERS IV SOLN: INTRAVENOUS | Status: DC

## 2020-12-22 MED ORDER — MAGNESIUM SULFATE 40 GM/1000ML IV SOLN
2.0000 g/h | INTRAVENOUS | Status: AC
  Filled 2020-12-22: qty 1000

## 2020-12-22 MED ORDER — MAGNESIUM SULFATE BOLUS VIA INFUSION
4.0000 g | Freq: Once | INTRAVENOUS | Status: AC
  Filled 2020-12-22: qty 1000

## 2020-12-22 MED ORDER — ZOLPIDEM TARTRATE 5 MG PO TABS
5.0000 mg | ORAL_TABLET | Freq: Every evening | ORAL | Status: DC | PRN
  Administered 2020-12-24: 5 mg via ORAL
  Filled 2020-12-22: qty 1

## 2020-12-22 MED ORDER — DOCUSATE SODIUM 100 MG PO CAPS: 100.0000 mg | ORAL_CAPSULE | Freq: Every day | ORAL | Status: DC

## 2020-12-22 MED ORDER — CALCIUM CARBONATE ANTACID 500 MG PO CHEW: 2.0000 | CHEWABLE_TABLET | ORAL | Status: DC | PRN

## 2020-12-22 MED ORDER — CALCIUM GLUCONATE-NACL 1-0.675 GM/50ML-% IV SOLN: 1.0000 g | Freq: Once | INTRAVENOUS | Status: AC

## 2020-12-22 NOTE — Plan of Care (Signed)
  Problem: Education: Goal: Knowledge of General Education information will improve Description: Including pain rating scale, medication(s)/side effects and non-pharmacologic comfort measures Outcome: Completed/Met

## 2020-12-22 NOTE — Consult Note (Signed)
Neonatology Consult Note:  At the request of the patients obstetrician Dr. Ilda Basset I met with Elveria Royals who is a 25 y.o. G2P0101 at 84w5dadmitted for preeclampsia.  Pregnancy complicated by hypothyroidism, history of preeclampsia, history of cesarean delivery, UKore[place]ith fetal pericardial effusion, FGR with AEDF, h/o COVID.  We discussed morbidity/mortality at this gestional age, delivery room resuscitation, including intubation and surfactant in DR.  Discussed mechanical ventilation and risk for chronic lung disease, risk for IVH with potential for motor / cognitive deficits, ROP, NEC, sepsis, as well as temperature instability and feeding immaturity.  Discussed NG / OG feeds, benefits of MBM in reducing incidence of NEC.   Discussed likely length of stay.  Thank you for allowing [facility] participate in her care.  Please call with questions.  BHiginio Roger DO  Neonatologist  The total length of face-to-face or floor / unit time for this encounter was 25 minutes.  Counseling and / or coordination of care was greater than fifty percent of the time.

## 2020-12-22 NOTE — Consult Note (Signed)
MFM Note  Nichole Bush is currently at 29 weeks and 5 days. She has a known IUGR fetus with absent end-diastolic flow. She was admitted this afternoon due to severe preeclampsia. Her blood pressures were in the 150s over 100s range and she was complaining of a headache. She also has a 20 pound weight gain over the past few days. Her PIH labs are within normal limits.  Doppler studies of the umbilical arteries performed today continues to show absent end-diastolic flow.  A biophysical profile performed today was 8 out of 8.  Low normal amniotic fluid with a total AFI of 9.5 cm was noted.  As the patient is now complaining of headaches along with severe range blood pressures, I would recommend delivery once she receives a rescue course of steroids.  To expedite delivery, the rescue steroid course may be given 12 hours apart.  She should be continued on magnesium sulfate for maternal seizure prophylaxis and fetal neuroprotection.

## 2020-12-22 NOTE — MAU Note (Signed)
.   Nichole Bush is a 26 y.o. at [redacted]w[redacted]d here in MAU reporting: sent from office for elevated BP with dx of Pre E. Reports HA and blurry vision. No epigastric pain. Increased swelling.   Pain score: 6 Vitals:   12/22/20 1244  BP: (!) 149/98  Pulse: 93  Resp: 15  Temp: 99.3 F (37.4 C)  SpO2: 100%

## 2020-12-22 NOTE — Progress Notes (Addendum)
   PRENATAL VISIT NOTE  Subjective:  Nichole Bush is a 25 y.o. G2P0101 at [redacted]w[redacted]d being seen today for ongoing prenatal care.  She is currently monitored for the following issues for this high-risk pregnancy and has Henoch-Schonlein purpura (HCC); Asthma; Gastroesophageal reflux disease with esophagitis; Hypothyroidism; Vitamin D deficiency; Supervision of other normal pregnancy, antepartum; History of pre-eclampsia in prior pregnancy, currently pregnant; History of cesarean delivery; and Abnormal fetal ultrasound on their problem list.  Patient reports increased headache and blurry vision that has been present since this morning.  She's had almost 30 pounds of weight gain in the last two days since she was in the MAU.  Swelling in lower extremities really increased 24 hours but has been getting worse the last four days.  Hurts to walk and stand.  Contractions: Irritability. Vag. Bleeding: None.  Movement: (!) Decreased Pt reports she's never felt good fetal movement with this pregnancy.  Denies leaking of fluid.   The following portions of the patient's history were reviewed and updated as appropriate: allergies, current medications, past family history, past medical history, past social history, past surgical history and problem list.   Objective:   Vitals:   12/22/20 0837 12/22/20 0841 12/22/20 1034  BP: (!) 152/101 (!) 147/95 (!) 159/116  Pulse: 82 79   Weight: 279 lb (126.6 kg)      Fetal Status: Fetal Heart Rate (bpm): 153   Movement: (!) Decreased     General:  Alert, oriented and cooperative. Patient is in no acute distress.  Skin: Skin is warm and dry. No rash noted.   Cardiovascular: Normal heart rate noted  Respiratory: Normal respiratory effort, no problems with respiration noted  Abdomen: Soft, gravid, appropriate for gestational age.  Pain/Pressure: Absent     Pelvic: Cervical exam deferred        Extremities: Normal range of motion. Edema: pitting up to mid calf  Mental  Status: Normal mood and affect. Normal behavior. Normal judgment and thought content.   Assessment and Plan:  Pregnancy: G2P0101 at [redacted]w[redacted]d 1. [redacted] weeks gestation of pregnancy - CBC - Glucose Tolerance, 2 Hours w/1 Hour - HIV Antibody (routine testing w rflx) - RPR - POC Urinalysis Dipstick OB  2. Supervision of other normal pregnancy, antepartum  3. Abnormal fetal ultrasound  4. Hypertension affecting pregnancy, antepartum - POC Urinalysis Dipstick OB - on Baby ASA  5. History of cesarean delivery  6. History of pre-eclampsia in prior pregnancy, currently pregnant - received steroids on 12/11/2020 and 12/12/2020  Spoke with Dr. Adrian Blackwater and will sent pt for direct admit due to concerns regarding severe preeclampsia.     Future Appointments  Date Time Provider Department Center  12/23/2020  9:30 AM [facility] NURSE [facility]  12/23/2020  9:45 AM [facility] US6 [facility]  12/27/2020  9:15 AM [facility] NURSE [facility]  12/27/2020  9:30 AM [facility] US3 [facility]  12/30/2020  9:30 AM [facility] NURSE [facility]  12/30/2020  9:45 AM [facility] NST [facility]  01/03/2021  9:15 AM [facility] NURSE [facility]  01/03/2021  9:30 AM [facility] US3 [facility]  01/03/2021 10:45 AM [facility] NST [facility]  01/06/2021  9:30 AM [facility] NURSE [facility]  01/06/2021  9:45 AM [facility] NST [facility]    Jerene Bears, MD

## 2020-12-22 NOTE — H&P (Signed)
[facility] ANTENATAL ADMISSION HP NOTE   History of Present Illness: Nichole Bush is a 25 y.o. G2P0101 at 53w5dadmitted for preeclampsia with known AEDF. Was in the office for routine care and noted to have elevated BP 150s/100s. Reports HA but did not take anything at home. She was given tylenol in the MAU and this did not improve the HA. She reports associated visual changes/blurry vision. Denies RUQ pain. Reports 20 lb weight gain in the past week and LE swelling.   Patient reports the fetal movement as active. Patient reports uterine contraction  activity as none. Patient reports  vaginal bleeding as none. Patient describes fluid per vagina as None. Fetal presentation is  [place] .  Patient Active Problem List   Diagnosis Date Noted   Abnormal fetal ultrasound 11/07/2020   Supervision of other normal pregnancy, antepartum 07/26/2020   History of pre-eclampsia in prior pregnancy, currently pregnant 07/26/2020   History of cesarean delivery 07/26/2020   Vitamin D deficiency 12/27/2018   [place] 02/26[place]   Gastroesophageal reflux disease with esophagitis 09/18/2016   Asthma 04/18/2016   Hypothyroidism 06/25/2015    Past Medical History:  Diagnosis Date   Constipation    COVID-19 affecting pregnancy, antepartum 12/09/2020   Gestational diabetes    HSP (Henoch Schonlein purpura) ([place]    Hypertension    Migraine    Thyroid disease     Past Surgical History:  Procedure Laterality Date   CESAREAN SECTION N/A 04/10/2019   Procedure: CESAREAN SECTION;  Surgeon: PDonnamae Jude MD;  Location: [facility] LD ORS;  Service: Obstetrics;  Laterality: N/A;   ESOPHAGOGASTRODUODENOSCOPY     WISDOM TOOTH EXTRACTION      OB History  Gravida Para Term Preterm AB Living  _0 SAB IAB Ectopic Multiple Live Births        0 1    # Outcome Date GA Lbr Len/2nd Weight Sex Delivery Anes PTL Lv  2 Current           1 Preterm 04/10/19 328w5d4:45 / 02:46 2470 g M  CS-LTranv EPI  LIV     Birth Comments: wnl    Social History   Socioeconomic History   Marital status: Single    Spouse name: Not on file   Number of children: Not on file   Years of education: HS   Highest education level: Not on file  Occupational History   Occupation: [employer]  Tobacco Use   Smoking status: Never Smoker   Smokeless tobacco: Never Used  VaScientific laboratory technicianse: Former   Substances: CBD  Substance and Sexual Activity   Alcohol use: No    Alcohol/week: 0.0 standard drinks   Drug use: No   Sexual activity: Not Currently    Birth control/protection: None  Other Topics Concern   Not on file  Social History Narrative   Lives at home w/ her mom   Left-handed   Caffeine: occasional tea, has stopped drinking sodas x2 months (10/30/2016)   Social Determinants of Health   Financial Resource Strain: Not on file  Food Insecurity: Not on file  Transportation Needs: Not on file  Physical Activity: Not on file  Stress: Not on file  Social Connections: Not on file    Family History  Problem Relation Age of Onset   Migraines Father    Hypertension Father     Allergies  Allergen Reactions   Peanut-Containing Drug Products Anaphylaxis  Penicillins Anaphylaxis, Hives and Other (See Comments)    Has patient had a PCN reaction causing immediate rash, facial/tongue/throat swelling, SOB or lightheadedness with hypotension: Yes Has patient had a PCN reaction causing severe rash involving mucus membranes or skin necrosis: No Has patient had a PCN reaction that required hospitalization No Has patient had a PCN reaction occurring within the last 10 years: No If all of the above answers are "NO", then may proceed with Cephalosporin use.    Peanut Oil Hives    Medications Prior to Admission  Medication Sig Dispense Refill Last Dose   aspirin EC 81 MG tablet Take 81 mg by mouth every other day. Swallow whole.   12/21/2020 at Unknown time   famotidine (PEPCID) 20 MG  tablet Take 20 mg by mouth 2 (two) times daily.   12/21/2020 at Unknown time   levothyroxine (SYNTHROID) 50 MCG tablet Take 1 tablet (50 mcg total) by mouth daily before breakfast. 30 tablet 1 12/21/2020 at Unknown time   AMBULATORY NON FORMULARY MEDICATION 1 Device by Other route once a week. [place] pressure cuff /Large  Monitored Regularly at home  ICD 10: HROB O09.90 1 kit 0    bisacodyl (BISACODYL) 5 MG EC tablet Take 1 tablet (5 mg total) by mouth 2 (two) times daily. 30 tablet 0    Butalbital-APAP-Caffeine 50-325-40 MG capsule Take 1-2 capsules by mouth every 6 (six) hours as needed for headache. 30 capsule 0    cyclobenzaprine (FLEXERIL) 10 MG tablet Take 1 tablet (10 mg total) by mouth 3 (three) times daily as needed for muscle spasms. 30 tablet 1    Elastic Bandages & Supports (COMFORT FIT MATERNITY SUPP MED) MISC 1 Device by Does not apply route daily. 1 each 1    Elastic Bandages & Supports (MEDICAL COMPRESSION STOCKINGS) MISC 2 each by Does not apply route continuous as needed. 2 each 0    fluticasone (FLOVENT HFA) 110 MCG/ACT inhaler Inhale 2 puffs into the lungs daily. 1 each 12    ipratropium-albuterol (DUONEB) 0.5-2.5 (3) MG/3ML SOLN Inhale into the lungs.      ondansetron (ZOFRAN ODT) 4 MG disintegrating tablet Take 1 tablet (4 mg total) by mouth every 6 (six) hours as needed for nausea. 20 tablet 0    pantoprazole (PROTONIX) 20 MG tablet Take 1 tablet (20 mg total) by mouth daily. 30 tablet 1    polyethylene glycol powder (GLYCOLAX/MIRALAX) 17 GM/SCOOP powder Take by mouth.      Prenat-FeAsp-Meth-FA-DHA w/o A (PRENATE DHA) 18-0.6-0.4-300 MG CAPS Take 1 capsule by mouth daily. 30 capsule 11    promethazine (PHENERGAN) 25 MG tablet Take 1 tablet (25 mg total) by mouth every 6 (six) hours as needed for nausea or vomiting. 60 tablet 2     Review of Systems  Constitutional: Negative for chills and fever.  Eyes: Positive for blurred vision. Negative for double vision.  Respiratory:  Negative for cough and shortness of breath.   Cardiovascular: Positive for leg swelling. Negative for chest pain and orthopnea.  Gastrointestinal: Negative for nausea and vomiting.  Genitourinary: Negative for dysuria, flank pain and frequency.  Musculoskeletal: Negative for myalgias.  Skin: Negative for rash.  Neurological: Positive for headaches (present for 1 day, has not tried medications). Negative for dizziness, tingling and weakness.  Endo/Heme/Allergies: Does not bruise/bleed easily.  Psychiatric/Behavioral: Negative for depression and suicidal ideas. The patient is not nervous/anxious.      Vitals:  BP 138/77   Pulse 84   Temp 99.3   F (37.4 C) (Oral)   Resp 15   LMP 05/28/2020 (Approximate)   SpO2 100%  Physical Examination: CONSTITUTIONAL: Well-developed, well-nourished female in no acute distress.  HENT:  Normocephalic, atraumatic, External right and left ear normal. Oropharynx is clear and moist EYES: Conjunctivae and EOM are normal. Pupils are equal, round, and reactive to light. No scleral icterus.  NECK: Normal range of motion, supple, no masses SKIN: Skin is warm and dry. No rash noted. Not diaphoretic. No erythema. No pallor. NEUROLGIC: Alert and oriented to person, place, and time. Normal reflexes, muscle tone coordination. No cranial nerve deficit noted. PSYCHIATRIC: Normal mood and affect. Normal behavior. Normal judgment and thought content. CARDIOVASCULAR: Normal heart rate noted, regular rhythm. 3+ pitting edema to the pannus RESPIRATORY: Effort and breath sounds normal, no problems with respiration noted ABDOMEN: Soft, nontender, nondistended, gravid. MUSCULOSKELETAL: Normal range of motion. No edema and no tenderness. 2+ distal pulses.  Membranes:intact Fetal Monitoring:Baseline: 140 bpm, Variability: Good {> 6 bpm) and Accelerations: Non-reactive but appropriate for gestational age Tocometer: Flat  Labs:  Results for orders placed or performed in visit  on 12/22/20 (from the past 24 hour(s))  POC Urinalysis Dipstick OB   Collection Time: 12/22/20  8:59 AM  Result Value Ref Range   Color, UA     Clarity, UA clear    Glucose, UA Negative Negative   Bilirubin, UA negative    Ketones, UA negative    Spec Grav, UA 1.015 1.010 - 1.025   Blood, UA small    pH, UA 7.0 5.0 - 8.0   POC,PROTEIN,UA 4+ Negative, Trace, Small (1+), Moderate (2+), Large (3+), 4+   Urobilinogen, UA 0.2 0.2 or 1.0 E.U./dL   Nitrite, UA negative    Leukocytes, UA Negative Negative   Appearance     Odor      Imaging Studies: US MFM FETAL BPP W/NONSTRESS  Result Date: 12/15/2020 ----------------------------------------------------------------------  OBSTETRICS REPORT                       (Signed Final 12/15/2020 02:03 pm) ---------------------------------------------------------------------- Patient Info  ID #:       [id number]                          D.O.B.:  11/10/1996 (24 yrs)  Name:       Nichole Bush                   Visit Date: 12/15/2020 11:33 am ---------------------------------------------------------------------- Performed By  Attending:        Corenthian Booker      Secondary Phy.:   [facility]                    MD  Performed By:     Nicole S Dalrymple     Address:          [address]                    [place], [place]                                                             Rd  Referred By:      MARIE L WILLIAMS         Location[facility]         [facility]                                                             [place] for                                                             Women ---------------------------------------------------------------------- Orders  #  Description                           Code        Ordered By  1  [place] MFM UA CORD DOPPLER                76820.02    [place]  2  [place] MFM OB LIMITED                     76815.01    [facility]  3  [place] MFM FETAL BPP                       63016.0     Peterson Ao     W/NONSTRESS ----------------------------------------------------------------------  #  Order #                     Accession #                Episode #  1  [id number]                   [id number]                 [id number]  2  [id number]                   [id number]                 [id number]  3  [id number]                   [id number]                 [id number] ---------------------------------------------------------------------- Indications  Maternal care for known or suspected poor      O36.5921  fetal growth, second trimester, fetus 1 IUGR  [redacted] weeks gestation of pregnancy                Z3A.28  Thyroid disease in pregnancy                   O99.280, E07.9  (hypothyroidism)  History of cesarean delivery, currently        O17.219  pregnant  Poor obstetric  history: Previous               O09.299  preeclampsia / eclampsia/gestational HTN  Asthma                                         O99.89 j45.909  Poor obstetric history: Previous gestational   O09.299  diabetes  LR NIPS/ - Neg AFP  Obesity complicating pregnancy, second         O99.212  trimester (BMI 42)  Encounter for antenatal screening for          Z36.3  malformations  Short interval between pregancies, 2nd         O09.892  trimester ---------------------------------------------------------------------- Fetal Evaluation  Num Of Fetuses:         1  Fetal Heart Rate(bpm):  152  Cardiac Activity:       Observed  Presentation:           Cephalic  Placenta:               Posterior  P. Cord Insertion:      Previously Visualized  Amniotic Fluid  AFI FV:      Subjectively decreased  AFI Sum(cm)     %Tile       Largest Pocket(cm)  8               < 3         3.7  RUQ(cm)       RLQ(cm)       LUQ(cm)        LLQ(cm)  1.6           3.7           0              2.7 ---------------------------------------------------------------------- Biophysical Evaluation  Amniotic F.V:   Pocket => 2 cm             F. Tone:        Observed  F. Movement:     Observed                   N.S.T:          Nonreactive  F. Breathing:   Observed                   Score:          8/10 ---------------------------------------------------------------------- OB History  Blood Type:   B+  Gravidity:    2         Term:   0        Prem:   1        SAB:   0  TOP:          0       Ectopic:  0        Living: 1 ---------------------------------------------------------------------- Gestational Age  LMP:           28w 5d        Date:  05/28/20                 EDD:   03/04/21  Best:          Timothy Lasso 5d     Det. By:  LMP  (05/28/20)          EDD:   03/04/21 ---------------------------------------------------------------------- Doppler - Fetal Vessels  Umbilical Artery                                                              ADFV    RDFV                                                                Yes      No ---------------------------------------------------------------------- Impression  Antenatal testing due to IUGR with an EFW 6th% with an AC  < 7%.  Biophysical profile 8/8 with good fetal movement and  amniotic fluid volume  UA Dopplers were abnormal with AEDF but no intermittent or  persistent REDF  The NST is non-reactive today, however the biophyscial  profile was 8/8 (8/10)  Ms. Liz had BMZ 1/8 and 1/9 so she is steroid complete at  this time.  I discussed today's findings with her and the  recommendations to extend testing to 2x weekly and deliver  by 34 weeks.,  She will return in Friday and then M/Thu for NST/UA  Dopplers/NST. ---------------------------------------------------------------------- Recommendations  Initiate 2x weekly NST with weekly AFI and UA Dopplers.  If AEDF persist deliver between 32-34 weeks  If REDF presents consider delivery between 30-32 weeks. ----------------------------------------------------------------------               Sander Nephew, MD Electronically Signed Final Report   12/15/2020 02:03 pm  ----------------------------------------------------------------------  [place] MFM OB FOLLOW UP  Result Date: 12/06/2020 ----------------------------------------------------------------------  OBSTETRICS REPORT                       (Signed Final 12/06/2020 05:00 pm) ---------------------------------------------------------------------- Patient Info  ID #:       [id number]                          D.O.B.:  1996-11-16 (24 yrs)  Name:       Nichole Bush                   Visit Date: 12/06/2020 03:13 pm ---------------------------------------------------------------------- Performed By  Attending:        Johnell Comings MD         Secondary Phy.:   [facility]  Performed By:     Jacob Moores BS,       Address:          [place]                                                             [address]  Referred By:      Seabron Spates       Location:         [facility]                                                             [place] for                                                             Women ---------------------------------------------------------------------- Orders  #  Description                           Code        Ordered By  1  [place] MFM OB FOLLOW UP                   76816.01    RAVI SHANKAR  2  [place] MFM UA CORD DOPPLER                76820.02    RAVI [facility] ----------------------------------------------------------------------  #  Order #                     Accession #                Episode #  1  [id number]                   [id number]                 [id number]  2  [id number]                   [id number]                 [id number] ---------------------------------------------------------------------- Indications  Maternal care for known or suspected poor      O36.5921  fetal growth, second trimester, fetus 1 IUGR  [redacted] weeks gestation of pregnancy                Z3A.27  Thyroid disease in  pregnancy                   O99.280, E07.9  (hypothyroidism)  History of cesarean delivery, currently        O34.219  pregnant  Poor obstetric history: Previous               O09.299  preeclampsia / eclampsia/gestational HTN  Asthma                                         O99.89 j45.909  Poor obstetric history: Previous gestational   O09.299  diabetes  LR NIPS/ - Neg AFP  Obesity complicating pregnancy, second         O99.212  trimester (BMI 42)  Encounter for antenatal screening for          Z36.3  malformations  Short interval between pregancies, 2nd         O09.892  trimester ---------------------------------------------------------------------- Fetal Evaluation  Num Of Fetuses:         1  Fetal Heart Rate(bpm):  135  Cardiac Activity:       Observed  Presentation:           Cephalic  Placenta:               Posterior  P. Cord Insertion:      Previously Visualized  Amniotic Fluid  AFI FV:      Subjectively low-normal  AFI Sum(cm)     %Tile       Largest Pocket(cm)  9.2             4.4         3.6  RUQ(cm)       RLQ(cm)       LUQ(cm)        LLQ(cm)  1.2           1.8           3.6            2.6 ---------------------------------------------------------------------- Biometry  BPD:      65.8  mm     G. Age:  26w 4d         14  %    CI:         75.3   %    70 - 86                                                          FL/HC:      20.3   %    18.6 - 20.4  HC:      240.5  mm     G. Age:  26w 1d        2.3  %    HC/AC:      1.12        1.05 - 1.21  AC:      213.9  mm     G. Age:  25w 6d          7  %    FL/BPD:     74.2   %    71 - 87  FL:       48.8  mm     G. Age:  26w 3d         11  %    FL/AC:      22.8   %    20 - 24  LV:        3.6  mm  Est. FW:     896  gm           2 lb      6  % ---------------------------------------------------------------------- OB History  Blood Type:   B+  Gravidity:    2         Term:   0        Prem:   1        SAB:   0  TOP:          0       Ectopic:  0        Living: 1  ---------------------------------------------------------------------- Gestational Age  LMP:           27w 3d        Date:  05/28/20  EDD:   03/04/21  U/S Today:     26w 2d                                        EDD:   03/12/21  Best:          27w 3d     Det. By:  LMP  (05/28/20)          EDD:   03/04/21 ---------------------------------------------------------------------- Anatomy  Cranium:               Appears normal         Aortic Arch:            Previously seen  Cavum:                 Appears normal         Ductal Arch:            Previously seen  Ventricles:            Appears normal         Diaphragm:              Appears normal  Choroid Plexus:        Previously seen        Stomach:                Appears normal, left                                                                        sided  Cerebellum:            Appears normal         Abdomen:                Appears normal  Posterior Fossa:       Appears normal         Abdominal Wall:         Appears nml (cord                                                                        insert, abd wall)  Nuchal Fold:           Not applicable (>75    Cord Vessels:           Appears normal ([redacted]                         wks GA)                                        vessel cord)  Face:                  Appears normal  Kidneys:                Appear normal                         (orbits and profile)  Lips:                  Appears normal         Bladder:                Appears normal  Thoracic:              Appears normal         Spine:                  Limited views                                                                        previously seen  Heart:                 Pericardial            Upper Extremities:      Visualized Limited                         effusion  RVOT:                  Previously seen        Lower Extremities:      Previously seen  LVOT:                  Previously seen  Other:  Fetus appears to be female.  Technically difficult due to maternal          habitus and fetal position. ---------------------------------------------------------------------- Doppler - Fetal Vessels  Umbilical Artery   S/D     %tile      RI    %tile                             ADFV    RDFV   4.88   > 97.5     0.8       96                                No      No ---------------------------------------------------------------------- Cervix Uterus Adnexa  Cervix  Not visualized (advanced GA >24wks)  Uterus  No abnormality visualized.  Right Ovary  Within normal limits.  Left Ovary  Within normal limits.  Cul De Sac  No free fluid seen.  Adnexa  No abnormality visualized. ---------------------------------------------------------------------- Comments  This patient was seen for a follow exam as a pericardial  effusion was noted during her last ultrasound exam.  She  denies any problems since her last exam.  On today's exam, the EFW measures at the 6th percentile for  her gestational age indicating fetal growth restriction.  Low  normal amniotic fluid with a total AFI of 9.2 cm was noted  today.  A small amount of pericardial effusion continues   to be  noted on today's exam.  Doppler studies of the umbilical arteries showed an elevated  S/D ratio of 4.88.  There were no signs of absent or reversed  end-diastolic flow.  The patient had a reactive nonstress test for her gestational  age following today's ultrasound exam.  The implications and management of fetal growth restriction  was discussed with the patient today.  We will continue to follow her with weekly fetal testing and  umbilical artery Doppler studies and will reassess the fetal  growth again in 3 weeks.  She understands that should absent end-diastolic flow be  noted during her future ultrasound exams, that a course of  antenatal corticosteroids will be administered at that time  along with more intense fetal monitoring.  An NST, amniotic fluid assessment, and umbilical artery  Doppler  study was scheduled in 1 week. ----------------------------------------------------------------------                   Johnell Comings, MD Electronically Signed Final Report   12/06/2020 05:00 pm ----------------------------------------------------------------------  [place] MFM OB LIMITED  Result Date: 12/15/2020 ----------------------------------------------------------------------  OBSTETRICS REPORT                       (Signed Final 12/15/2020 02:03 pm) ---------------------------------------------------------------------- Patient Info  ID #:       [id number]                          D.O.B.:  1996/09/28 (24 yrs)  Name:       Nichole Bush                   Visit Date: 12/15/2020 11:33 am ---------------------------------------------------------------------- Performed By  Attending:        Sander Nephew      Secondary Phy.:   [facility]                    MD  Performed By:     Dorena Dew     Address:          [address]                    [address], [address]                                                             [address]  Referred By:      Seabron Spates       Location:         [facility]                                                             Women ---------------------------------------------------------------------- Orders  #  Description                           Code        Ordered By  1  US MFM UA CORD DOPPLER                76820.02    YU FANG  2  US MFM OB LIMITED                     76815.01    YU FANG  3  US MFM FETAL BPP                      76818.5     YU FANG     W/NONSTRESS ----------------------------------------------------------------------  #  Order #                     Accession #                Episode #  1  [id number]                   [id number]                 [id number]  2  [id number]                   [id number]                  [id number]  3  [id number]                   [id number]                 [id number] ---------------------------------------------------------------------- Indications  Maternal care for known or suspected poor      O36.5921  fetal growth, second trimester, fetus 1 IUGR  [redacted] weeks gestation of pregnancy                Z3A.28  Thyroid disease in pregnancy                   O99.280, E07.9  (hypothyroidism)  History of cesarean delivery, currently        O34.219  pregnant  Poor obstetric history: Previous               O09.299  preeclampsia / eclampsia/gestational HTN  Asthma                                         O99.89 j45.909  Poor obstetric history: Previous gestational   O09.299  diabetes  LR NIPS/ - Neg AFP  Obesity complicating pregnancy, second         O99.212  trimester (BMI 42)  Encounter for antenatal screening for          Z36.3  malformations  Short interval between pregancies, 2nd         O09.892    trimester ---------------------------------------------------------------------- Fetal Evaluation  Num Of Fetuses:         1  Fetal Heart Rate(bpm):  152  Cardiac Activity:       Observed  Presentation:           Cephalic  Placenta:               Posterior  P. Cord Insertion:      Previously Visualized  Amniotic Fluid  AFI FV:      Subjectively decreased  AFI Sum(cm)     %Tile       Largest Pocket(cm)  8               < 3         3.7  RUQ(cm)       RLQ(cm)       LUQ(cm)        LLQ(cm)  1.6           3.7           0              2.7 ---------------------------------------------------------------------- Biophysical Evaluation  Amniotic F.V:   Pocket => 2 cm             F. Tone:        Observed  F. Movement:    Observed                   N.S.T:          Nonreactive  F. Breathing:   Observed                   Score:          8/10 ---------------------------------------------------------------------- OB History  Blood Type:   B+  Gravidity:    2         Term:   0        Prem:   1        SAB:   0  TOP:          0        Ectopic:  0        Living: 1 ---------------------------------------------------------------------- Gestational Age  LMP:           28w 5d        Date:  05/28/20                 EDD:   03/04/21  Best:          Timothy Lasso 5d     Det. By:  LMP  (05/28/20)          EDD:   03/04/21 ---------------------------------------------------------------------- Doppler - Fetal Vessels  Umbilical Artery                                                              ADFV    RDFV                                                                Yes  No ---------------------------------------------------------------------- Impression  Antenatal testing due to IUGR with an EFW 6th% with an AC  < 7%.  Biophysical profile 8/8 with good fetal movement and  amniotic fluid volume  UA Dopplers were abnormal with AEDF but no intermittent or  persistent REDF  The NST is non-reactive today, however the biophyscial  profile was 8/8 (8/10)  Ms. Avis had BMZ 1/8 and 1/9 so she is steroid complete at  this time.  I discussed today's findings with her and the  recommendations to extend testing to 2x weekly and deliver  by 34 weeks.,  She will return in Friday and then M/Thu for NST/UA  Dopplers/NST. ---------------------------------------------------------------------- Recommendations  Initiate 2x weekly NST with weekly AFI and UA Dopplers.  If AEDF persist deliver between 32-34 weeks  If REDF presents consider delivery between 30-32 weeks. ----------------------------------------------------------------------               Sander Nephew, MD Electronically Signed Final Report   12/15/2020 02:03 pm ----------------------------------------------------------------------  [place] MFM UA CORD DOPPLER  Result Date: 12/15/2020 ----------------------------------------------------------------------  OBSTETRICS REPORT                       (Signed Final 12/15/2020 02:03 pm) ---------------------------------------------------------------------- Patient Info  ID  #:       [id number]                          D.O.B.:  [birth year] (24 yrs)  Name:       Nichole Bush                   Visit Date: 12/15/2020 11:33 am ---------------------------------------------------------------------- Performed By  Attending:        Sander Nephew      Secondary Phy.:   [facility]                    MD  Performed By:     Dorena Dew     Address:          [place]                    BS, [address]MS                                                             Rd  Referred By:      Seabron Spates       [facility]                                                             [place] [facility] ---------------------------------------------------------------------- Orders  #  Description                           Code        Ordered By  1  US MFM UA CORD DOPPLER                76820.02    YU FANG  2  US MFM OB LIMITED                     76815.01    YU FANG  3  US MFM FETAL BPP                      76818.5     YU FANG     W/NONSTRESS ----------------------------------------------------------------------  #  Order #                     Accession #                Episode #  1  [id number]                   [id number]                 [id number]  2  [id number]                   [id number]                 [id number]  3  [id number]                   [id number]                 [id number] ---------------------------------------------------------------------- Indications  Maternal care for known or suspected poor      O36.5921  fetal growth, second trimester, fetus 1 IUGR  [redacted] weeks gestation of pregnancy                Z3A.28  Thyroid disease in pregnancy                   O99.280, E07.9  (hypothyroidism)  History of cesarean delivery, currently        O34.219  pregnant  Poor obstetric history: Previous               O09.299  preeclampsia /  eclampsia/gestational HTN  Asthma                                         O99.89 j45.909  Poor obstetric history: Previous gestational   O09.299  diabetes  LR NIPS/ - Neg AFP  Obesity complicating pregnancy, second         O99.212  trimester (BMI 42)  Encounter for antenatal screening for          Z36.3  malformations  Short interval between pregancies, 2nd         O09.892  trimester ---------------------------------------------------------------------- Fetal Evaluation  Num Of Fetuses:         1  Fetal Heart Rate(bpm):  152  Cardiac Activity:       Observed  Presentation:           Cephalic  Placenta:                 Posterior  P. Cord Insertion:      Previously Visualized  Amniotic Fluid  AFI FV:      Subjectively decreased  AFI Sum(cm)     %Tile       Largest Pocket(cm)  8               < 3         3.7  RUQ(cm)       RLQ(cm)       LUQ(cm)        LLQ(cm)  1.6           3.7           0              2.7 ---------------------------------------------------------------------- Biophysical Evaluation  Amniotic F.V:   Pocket => 2 cm             F. Tone:        Observed  F. Movement:    Observed                   N.S.T:          Nonreactive  F. Breathing:   Observed                   Score:          8/10 ---------------------------------------------------------------------- OB History  Blood Type:   B+  Gravidity:    2         Term:   0        Prem:   1        SAB:   0  TOP:          0       Ectopic:  0        Living: 1 ---------------------------------------------------------------------- Gestational Age  LMP:           28w 5d        Date:  05/28/20                 EDD:   03/04/21  Best:          28w 5d     Det. By:  LMP  (05/28/20)          EDD:   03/04/21 ---------------------------------------------------------------------- Doppler - Fetal Vessels  Umbilical Artery                                                              ADFV    RDFV                                                                Yes      No  ---------------------------------------------------------------------- Impression  Antenatal testing due to IUGR with an EFW 6th% with an AC  < 7%.  Biophysical profile 8/8 with good fetal movement and  amniotic fluid volume  UA Dopplers were abnormal with AEDF but no intermittent or  persistent REDF  The NST is non-reactive today, however the biophyscial  profile was 8/8 (  8/10)  Ms. Mohar had BMZ 1/8 and 1/9 so she is steroid complete at  this time.  I discussed today's findings with her and the  recommendations to extend testing to 2x weekly and deliver  by 34 weeks.,  She will return in Friday and then M/Thu for NST/UA  Dopplers/NST. ---------------------------------------------------------------------- Recommendations  Initiate 2x weekly NST with weekly AFI and UA Dopplers.  If AEDF persist deliver between 32-34 weeks  If REDF presents consider delivery between 30-32 weeks. ----------------------------------------------------------------------               Corenthian Booker, MD Electronically Signed Final Report   12/15/2020 02:03 pm ----------------------------------------------------------------------  US MFM UA CORD DOPPLER  Result Date: 12/06/2020 ----------------------------------------------------------------------  OBSTETRICS REPORT                       (Signed Final 12/06/2020 05:00 pm) ---------------------------------------------------------------------- Patient Info  ID #:       [id number]                          D.O.B.:  11/29/1996 (24 yrs)  Name:       Nichole Bush                   Visit Date: 12/06/2020 03:13 pm ---------------------------------------------------------------------- Performed By  Attending:        Victor Fang MD         Secondary Phy.:   [facility]  Performed By:     Mesha Tester BS,       Address:          [address]                    [address]MS, [address]T                                                             Rd  Referred By:      MARIE L WILLIAMS       [facility]          [facility] ---------------------------------------------------------------------- Orders  #  Description                             Code        Ordered By  1  US MFM OB FOLLOW UP                   76816.01    RAVI SHANKAR  2  US MFM UA CORD DOPPLER                76820.02    RAVI SHANKAR ----------------------------------------------------------------------  #  Order #                     Accession #                Episode #  1  [id number]                   [id number]                 [id number]  2  [id number]                   [id number]                 [id number] ---------------------------------------------------------------------- Indications  Maternal care for known or suspected poor      O36.5921  fetal growth, second trimester, fetus 1 IUGR  [redacted] weeks gestation of pregnancy                Z3A.27  Thyroid disease in pregnancy                   O99.280, E07.9  (hypothyroidism)  History of cesarean delivery, currently        O34.219  pregnant  Poor obstetric history: Previous               O09.299  preeclampsia / eclampsia/gestational HTN  Asthma                                         O99.89 j45.909  Poor obstetric history: Previous gestational   O09.299  diabetes  LR NIPS/ - Neg AFP  Obesity complicating pregnancy, second         O99.212  trimester (BMI 42)  Encounter for antenatal screening for          Z36.3  malformations  Short interval between pregancies, 2nd         O09.892  trimester ---------------------------------------------------------------------- Fetal Evaluation  Num Of Fetuses:         1  Fetal Heart Rate(bpm):  135  Cardiac Activity:       Observed  Presentation:           Cephalic  Placenta:               Posterior  P. Cord Insertion:      Previously Visualized  Amniotic Fluid   AFI FV:      Subjectively low-normal  AFI Sum(cm)     %Tile       Largest Pocket(cm)  9.2             4.4         3.6  RUQ(cm)       RLQ(cm)       LUQ(cm)        LLQ(cm)  1.2           1.8           3.6              2.6 ---------------------------------------------------------------------- Biometry  BPD:      65.8  mm     G. Age:  26w 4d         14  %    CI:         75.3   %    70 - 86                                                          FL/HC:      20.3   %    18.6 - 20.4  HC:      240.5  mm     G. Age:  26w 1d        2.3  %    HC/AC:      1.12        1.05 - 1.21  AC:      213.9  mm     G. Age:  25w 6d          7  %    FL/BPD:     74.2   %    71 - 87  FL:       48.8  mm     G. Age:  26w 3d         11  %    FL/AC:      22.8   %    20 - 24  LV:        3.6  mm  Est. FW:     896  gm           2 lb      6  % ---------------------------------------------------------------------- OB History  Blood Type:   B+  Gravidity:    2         Term:   0        Prem:   1        SAB:   0  TOP:          0       Ectopic:  0        Living: 1 ---------------------------------------------------------------------- Gestational Age  LMP:           27w 3d        Date:  05/28/20                 EDD:   03/04/21  U/S Today:     26w 2d                                        EDD:   03/12/21  Best:          27w 3d     Det. By:  LMP  (05/28/20)          EDD:   03/04/21 ---------------------------------------------------------------------- Anatomy  Cranium:               Appears normal         Aortic Arch:            Previously seen  Cavum:                 Appears normal  Ductal Arch:            Previously seen  Ventricles:            Appears normal         Diaphragm:              Appears normal  Choroid Plexus:        Previously seen        Stomach:                Appears normal, left                                                                        sided  Cerebellum:            Appears normal         Abdomen:                Appears  normal  Posterior Fossa:       Appears normal         Abdominal Wall:         Appears nml (cord                                                                        insert, abd wall)  Nuchal Fold:           Not applicable (>77    Cord Vessels:           Appears normal ([redacted]                         wks GA)                                        vessel cord)  Face:                  Appears normal         Kidneys:                Appear normal                         (orbits and profile)  Lips:                  Appears normal         Bladder:                Appears normal  Thoracic:              Appears normal         Spine:                  Limited views  previously seen  Heart:                 Pericardial            Upper Extremities:      Visualized Limited                         effusion  RVOT:                  Previously seen        Lower Extremities:      Previously seen  LVOT:                  Previously seen  Other:  Fetus appears to be female. Technically difficult due to maternal          habitus and fetal position. ---------------------------------------------------------------------- Doppler - Fetal Vessels  Umbilical Artery   S/D     %tile      RI    %tile                             ADFV    RDFV   4.88   > 97.5     0.8       96                                No      No ---------------------------------------------------------------------- Cervix Uterus Adnexa  Cervix  Not visualized (advanced GA >24wks)  Uterus  No abnormality visualized.  Right Ovary  Within normal limits.  Left Ovary  Within normal limits.  Cul De Sac  No free fluid seen.  Adnexa  No abnormality visualized. ---------------------------------------------------------------------- Comments  This patient was seen for a follow exam as a pericardial  effusion was noted during her last ultrasound exam.  She  denies any problems since her last exam.  On today's exam, the EFW  measures at the 6th percentile for  her gestational age indicating fetal growth restriction.  Low  normal amniotic fluid with a total AFI of 9.2 cm was noted  today.  A small amount of pericardial effusion continues to be  noted on today's exam.  Doppler studies of the umbilical arteries showed an elevated  S/D ratio of 4.88.  There were no signs of absent or reversed  end-diastolic flow.  The patient had a reactive nonstress test for her gestational  age following today's ultrasound exam.  The implications and management of fetal growth restriction  was discussed with the patient today.  We will continue to follow her with weekly fetal testing and  umbilical artery Doppler studies and will reassess the fetal  growth again in 3 weeks.  She understands that should absent end-diastolic flow be  noted during her future ultrasound exams, that a course of  antenatal corticosteroids will be administered at that time  along with more intense fetal monitoring.  An NST, amniotic fluid assessment, and umbilical artery  Doppler study was scheduled in 1 week. ----------------------------------------------------------------------                   Johnell Comings, MD Electronically Signed Final Report   12/06/2020 05:00 pm ----------------------------------------------------------------------    Assessment and Plan: Patient Active Problem List   Diagnosis Date Noted   Abnormal fetal ultrasound 11/07/2020   Supervision of other normal pregnancy,  antepartum 07/26/2020   History of pre-eclampsia in prior pregnancy, currently pregnant 07/26/2020   History of cesarean delivery 07/26/2020   Vitamin D deficiency 12/27/2018   Henoch-Schonlein purpura (HCC) 02/26/2018   Gastroesophageal reflux disease with esophagitis 09/18/2016   Asthma 04/18/2016   Hypothyroidism 06/25/2015   Admit to Antenatal Routine antenatal care  #Preeclampsia with severe features (HA, vision) - Start magnesium 4g bolus/2g infusion - AFI, UA  doppler for routine antenatal survellience and to assess AEDF vs REDF  #FGR - Limited US needed for EFW. Last growth was 12/06/20  #Mode of Delivery-- planning on RCS, determine timing based on clinical picture  #Infant - s/p BMZ 1/8 and 1/9 - Repeat in 2 weeks if < 34 weeks at delivery  #Reproductive life planning - desires BTL, signed papers 12/22/20   Kimberly Niles Newton, MD, MPH, ABFM Attending Physician Faculty Practice- [facility]     

## 2020-12-23 ENCOUNTER — Ambulatory Visit: Payer: Medicaid Other

## 2020-12-23 ENCOUNTER — Encounter (HOSPITAL_COMMUNITY)
Admission: AD | Disposition: A | Discharge status: Home / Self Care | Payer: Self-pay | Source: Home / Self Care | Attending: Obstetrics and Gynecology

## 2020-12-23 ENCOUNTER — Inpatient Hospital Stay (HOSPITAL_COMMUNITY): Payer: Medicaid Other | Admitting: Anesthesiology

## 2020-12-23 ENCOUNTER — Encounter (HOSPITAL_COMMUNITY): Payer: Self-pay | Admitting: Obstetrics and Gynecology

## 2020-12-23 DIAGNOSIS — Z302 Encounter for sterilization: Secondary | ICD-10-CM

## 2020-12-23 DIAGNOSIS — O1413 Severe pre-eclampsia, third trimester: Secondary | ICD-10-CM

## 2020-12-23 DIAGNOSIS — O34211 Maternal care for low transverse scar from previous cesarean delivery: Secondary | ICD-10-CM

## 2020-12-23 DIAGNOSIS — O322XX0 Maternal care for transverse and oblique lie, not applicable or unspecified: Secondary | ICD-10-CM

## 2020-12-23 DIAGNOSIS — O365930 Maternal care for other known or suspected poor fetal growth, third trimester, not applicable or unspecified: Secondary | ICD-10-CM

## 2020-12-23 DIAGNOSIS — Z3A29 29 weeks gestation of pregnancy: Secondary | ICD-10-CM

## 2020-12-23 LAB — CBC
HCT: 41 % (ref 36.0–46.0)
Hematocrit: 37.5 % (ref 34.0–46.6)
Hemoglobin: 12.7 g/dL (ref 11.1–15.9)
Hemoglobin: 13.6 g/dL (ref 12.0–15.0)
MCH: 30.7 pg (ref 26.6–33.0)
MCH: 30.4 pg (ref 26.0–34.0)
MCHC: 33.9 g/dL (ref 31.5–35.7)
MCHC: 33.2 g/dL (ref 30.0–36.0)
MCV: 91 fL (ref 79–97)
MCV: 91.7 fL (ref 80.0–100.0)
Platelets: 195 10*3/uL (ref 150–450)
Platelets: 217 10*3/uL (ref 150–400)
RBC: 4.14 x10E6/uL (ref 3.77–5.28)
RBC: 4.47 MIL/uL (ref 3.87–5.11)
RDW: 12.8 % (ref 11.7–15.4)
RDW: 13.4 % (ref 11.5–15.5)
WBC: 7.3 10*3/uL (ref 3.4–10.8)
WBC: 7.1 10*3/uL (ref 4.0–10.5)
nRBC: 0 % (ref 0.0–0.2)

## 2020-12-23 LAB — RPR: RPR Ser Ql: NONREACTIVE

## 2020-12-23 LAB — COMPREHENSIVE METABOLIC PANEL
ALT: 17 U/L (ref 0–44)
AST: 26 U/L (ref 15–41)
Albumin: 1.7 g/dL — ABNORMAL LOW (ref 3.5–5.0)
Alkaline Phosphatase: 204 U/L — ABNORMAL HIGH (ref 38–126)
Anion gap: 9 (ref 5–15)
BUN: 6 mg/dL (ref 6–20)
CO2: 21 mmol/L — ABNORMAL LOW (ref 22–32)
Calcium: 8.1 mg/dL — ABNORMAL LOW (ref 8.9–10.3)
Chloride: 107 mmol/L (ref 98–111)
Creatinine, Ser: 0.68 mg/dL (ref 0.44–1.00)
GFR, Estimated: >60 mL/min (ref >60)
Glucose, Bld: 105 mg/dL — ABNORMAL HIGH (ref 70–99)
Potassium: 4.2 mmol/L (ref 3.5–5.1)
Sodium: 137 mmol/L (ref 135–145)
Total Bilirubin: 0.6 mg/dL (ref 0.3–1.2)
Total Protein: 5.1 g/dL — ABNORMAL LOW (ref 6.5–8.1)

## 2020-12-23 LAB — GLUCOSE TOLERANCE, 2 HOURS W/ 1HR
Glucose, 1 hour: 111 mg/dL (ref 65–179)
Glucose, 2 hour: 62 mg/dL — ABNORMAL LOW (ref 65–152)
Glucose, Fasting: 71 mg/dL (ref 65–91)

## 2020-12-23 LAB — MAGNESIUM: Magnesium: 4.3 mg/dL — ABNORMAL HIGH (ref 1.7–2.4)

## 2020-12-23 LAB — HIV ANTIBODY (ROUTINE TESTING W REFLEX): HIV Screen 4th Generation wRfx: NONREACTIVE

## 2020-12-23 SURGERY — Surgical Case
Anesthesia: Spinal

## 2020-12-23 MED ORDER — OXYTOCIN-SODIUM CHLORIDE 30-0.9 UT/500ML-% IV SOLN
INTRAVENOUS | Status: DC | PRN
  Administered 2020-12-23: 200 mL via INTRAVENOUS

## 2020-12-23 MED ORDER — DEXAMETHASONE SODIUM PHOSPHATE 4 MG/ML IJ SOLN
INTRAMUSCULAR | Status: AC
  Filled 2020-12-23: qty 2

## 2020-12-23 MED ORDER — DIPHENHYDRAMINE HCL 25 MG PO CAPS: 25.0000 mg | ORAL_CAPSULE | Freq: Four times a day (QID) | ORAL | Status: DC | PRN

## 2020-12-23 MED ORDER — CLINDAMYCIN PHOSPHATE 900 MG/50ML IV SOLN
INTRAVENOUS | Status: DC | PRN
  Administered 2020-12-23: 900 mg via INTRAVENOUS

## 2020-12-23 MED ORDER — MAGNESIUM SULFATE 40 GM/1000ML IV SOLN: 2.0000 g/h | INTRAVENOUS | Status: DC

## 2020-12-23 MED ORDER — ACETAMINOPHEN 325 MG PO TABS
650.0000 mg | ORAL_TABLET | ORAL | Status: DC | PRN
  Administered 2020-12-24: 650 mg via ORAL
  Filled 2020-12-23: qty 2

## 2020-12-23 MED ORDER — ONDANSETRON HCL 4 MG/2ML IJ SOLN: 4.0000 mg | Freq: Once | INTRAMUSCULAR | Status: DC | PRN

## 2020-12-23 MED ORDER — FENTANYL CITRATE (PF) 100 MCG/2ML IJ SOLN: 25.0000 ug | INTRAMUSCULAR | Status: DC | PRN

## 2020-12-23 MED ORDER — OXYCODONE HCL 5 MG PO TABS: 5.0000 mg | ORAL_TABLET | Freq: Once | ORAL | Status: DC | PRN

## 2020-12-23 MED ORDER — CLINDAMYCIN PHOSPHATE 900 MG/50ML IV SOLN
INTRAVENOUS | Status: AC
  Filled 2020-12-23: qty 50

## 2020-12-23 MED ORDER — KETOROLAC TROMETHAMINE 30 MG/ML IJ SOLN
INTRAMUSCULAR | Status: AC
  Filled 2020-12-23: qty 1

## 2020-12-23 MED ORDER — NALBUPHINE HCL 10 MG/ML IJ SOLN: 5.0000 mg | Freq: Once | INTRAMUSCULAR | Status: DC | PRN

## 2020-12-23 MED ORDER — PHENYLEPHRINE HCL-NACL 20-0.9 MG/250ML-% IV SOLN
INTRAVENOUS | Status: AC
  Filled 2020-12-23: qty 250

## 2020-12-23 MED ORDER — SOD CITRATE-CITRIC ACID 500-334 MG/5ML PO SOLN: 30.0000 mL | ORAL | Status: DC

## 2020-12-23 MED ORDER — LACTATED RINGERS IV SOLN: INTRAVENOUS | Status: DC | PRN

## 2020-12-23 MED ORDER — MAGNESIUM SULFATE BOLUS VIA INFUSION
4.0000 g | Freq: Once | INTRAVENOUS | Status: AC
  Administered 2020-12-23: 4 g via INTRAVENOUS
  Filled 2020-12-23: qty 1000

## 2020-12-23 MED ORDER — DIPHENHYDRAMINE HCL 25 MG PO CAPS: 25.0000 mg | ORAL_CAPSULE | ORAL | Status: DC | PRN

## 2020-12-23 MED ORDER — LABETALOL HCL 5 MG/ML IV SOLN: 20.0000 mg | INTRAVENOUS | Status: DC | PRN

## 2020-12-23 MED ORDER — PRENATAL MULTIVITAMIN CH
1.0000 | ORAL_TABLET | Freq: Every day | ORAL | Status: DC
  Administered 2020-12-23 – 2020-12-26 (×4): 1 via ORAL
  Filled 2020-12-23 (×4): qty 1

## 2020-12-23 MED ORDER — OXYTOCIN-SODIUM CHLORIDE 30-0.9 UT/500ML-% IV SOLN
INTRAVENOUS | Status: AC
  Filled 2020-12-23: qty 500

## 2020-12-23 MED ORDER — AMLODIPINE BESYLATE 10 MG PO TABS
10.0000 mg | ORAL_TABLET | Freq: Every day | ORAL | Status: DC
  Administered 2020-12-23 – 2020-12-26 (×4): 10 mg via ORAL
  Filled 2020-12-23 (×4): qty 1

## 2020-12-23 MED ORDER — ENOXAPARIN SODIUM 60 MG/0.6ML ~~LOC~~ SOLN
60.0000 mg | SUBCUTANEOUS | Status: DC
  Administered 2020-12-24 – 2020-12-26 (×3): 60 mg via SUBCUTANEOUS
  Filled 2020-12-23 (×3): qty 0.6

## 2020-12-23 MED ORDER — DEXAMETHASONE SODIUM PHOSPHATE 4 MG/ML IJ SOLN
INTRAMUSCULAR | Status: DC | PRN
  Administered 2020-12-23: 4 mg via INTRAVENOUS

## 2020-12-23 MED ORDER — MENTHOL 3 MG MT LOZG: 1.0000 | LOZENGE | OROMUCOSAL | Status: DC | PRN

## 2020-12-23 MED ORDER — WITCH HAZEL-GLYCERIN EX PADS: 1.0000 "application " | MEDICATED_PAD | CUTANEOUS | Status: DC | PRN

## 2020-12-23 MED ORDER — SIMETHICONE 80 MG PO CHEW
80.0000 mg | CHEWABLE_TABLET | Freq: Three times a day (TID) | ORAL | Status: DC
  Administered 2020-12-23 – 2020-12-26 (×8): 80 mg via ORAL
  Filled 2020-12-23 (×7): qty 1

## 2020-12-23 MED ORDER — ONDANSETRON HCL 4 MG/2ML IJ SOLN
INTRAMUSCULAR | Status: AC
  Filled 2020-12-23: qty 2

## 2020-12-23 MED ORDER — BUPIVACAINE IN DEXTROSE 0.75-8.25 % IT SOLN
INTRATHECAL | Status: DC | PRN
  Administered 2020-12-23: 1.6 mL via INTRATHECAL

## 2020-12-23 MED ORDER — MAGNESIUM SULFATE 40 GM/1000ML IV SOLN
2.0000 g/h | INTRAVENOUS | Status: DC
  Administered 2020-12-23: 2 g/h via INTRAVENOUS

## 2020-12-23 MED ORDER — FENTANYL CITRATE (PF) 100 MCG/2ML IJ SOLN
INTRAMUSCULAR | Status: AC
  Filled 2020-12-23: qty 2

## 2020-12-23 MED ORDER — ONDANSETRON HCL 4 MG/2ML IJ SOLN
INTRAMUSCULAR | Status: DC | PRN
  Administered 2020-12-23: 4 mg via INTRAVENOUS

## 2020-12-23 MED ORDER — MORPHINE SULFATE (PF) 0.5 MG/ML IJ SOLN
INTRAMUSCULAR | Status: DC | PRN
  Administered 2020-12-23: 150 ug via INTRATHECAL

## 2020-12-23 MED ORDER — ONDANSETRON HCL 4 MG/2ML IJ SOLN: 4.0000 mg | Freq: Three times a day (TID) | INTRAMUSCULAR | Status: DC | PRN

## 2020-12-23 MED ORDER — DIBUCAINE (PERIANAL) 1 % EX OINT: 1.0000 "application " | TOPICAL_OINTMENT | CUTANEOUS | Status: DC | PRN

## 2020-12-23 MED ORDER — CLINDAMYCIN PHOSPHATE 900 MG/50ML IV SOLN
900.0000 mg | INTRAVENOUS | Status: DC
  Filled 2020-12-23: qty 50

## 2020-12-23 MED ORDER — MORPHINE SULFATE (PF) 0.5 MG/ML IJ SOLN
INTRAMUSCULAR | Status: AC
  Filled 2020-12-23: qty 10

## 2020-12-23 MED ORDER — NALBUPHINE HCL 10 MG/ML IJ SOLN: 5.0000 mg | INTRAMUSCULAR | Status: DC | PRN

## 2020-12-23 MED ORDER — KETOROLAC TROMETHAMINE 30 MG/ML IJ SOLN: 30.0000 mg | Freq: Four times a day (QID) | INTRAMUSCULAR | Status: AC | PRN

## 2020-12-23 MED ORDER — IBUPROFEN 800 MG PO TABS
800.0000 mg | ORAL_TABLET | Freq: Three times a day (TID) | ORAL | Status: AC
Start: 2020-12-23 — End: 2020-12-26
  Administered 2020-12-23 – 2020-12-26 (×9): 800 mg via ORAL
  Filled 2020-12-23 (×9): qty 1

## 2020-12-23 MED ORDER — METOCLOPRAMIDE HCL 5 MG/ML IJ SOLN
INTRAMUSCULAR | Status: AC
  Filled 2020-12-23: qty 2

## 2020-12-23 MED ORDER — SODIUM CHLORIDE 0.9% FLUSH
3.0000 mL | INTRAVENOUS | Status: DC | PRN
  Administered 2020-12-25: 3 mL via INTRAVENOUS

## 2020-12-23 MED ORDER — TETANUS-DIPHTH-ACELL PERTUSSIS 5-2.5-18.5 LF-MCG/0.5 IM SUSY
0.5000 mL | PREFILLED_SYRINGE | Freq: Once | INTRAMUSCULAR | Status: AC
  Administered 2020-12-26: 0.5 mL via INTRAMUSCULAR
  Filled 2020-12-23: qty 0.5

## 2020-12-23 MED ORDER — PHENYLEPHRINE HCL-NACL 20-0.9 MG/250ML-% IV SOLN
INTRAVENOUS | Status: DC | PRN
  Administered 2020-12-23: 30 ug/min via INTRAVENOUS

## 2020-12-23 MED ORDER — COCONUT OIL OIL
1.0000 "application " | TOPICAL_OIL | Status: DC | PRN
  Administered 2020-12-24: 1 via TOPICAL

## 2020-12-23 MED ORDER — POLYETHYLENE GLYCOL 3350 17 G PO PACK
17.0000 g | PACK | Freq: Every day | ORAL | Status: DC
  Administered 2020-12-24 – 2020-12-26 (×3): 17 g via ORAL
  Filled 2020-12-23 (×3): qty 1

## 2020-12-23 MED ORDER — HYDRALAZINE HCL 20 MG/ML IJ SOLN: 10.0000 mg | INTRAMUSCULAR | Status: DC | PRN

## 2020-12-23 MED ORDER — MEPERIDINE HCL 25 MG/ML IJ SOLN: 6.2500 mg | INTRAMUSCULAR | Status: DC | PRN

## 2020-12-23 MED ORDER — FENTANYL CITRATE (PF) 100 MCG/2ML IJ SOLN
INTRAMUSCULAR | Status: DC | PRN
  Administered 2020-12-23: 15 ug via INTRATHECAL

## 2020-12-23 MED ORDER — PHENYLEPHRINE 40 MCG/ML (10ML) SYRINGE FOR IV PUSH (FOR BLOOD PRESSURE SUPPORT)
PREFILLED_SYRINGE | INTRAVENOUS | Status: AC
  Filled 2020-12-23: qty 10

## 2020-12-23 MED ORDER — NALOXONE HCL 0.4 MG/ML IJ SOLN: 0.4000 mg | INTRAMUSCULAR | Status: DC | PRN

## 2020-12-23 MED ORDER — NALBUPHINE HCL 10 MG/ML IJ SOLN
5.0000 mg | INTRAMUSCULAR | Status: DC | PRN
Start: 2020-12-23 — End: 2020-12-26

## 2020-12-23 MED ORDER — SCOPOLAMINE 1 MG/3DAYS TD PT72
MEDICATED_PATCH | TRANSDERMAL | Status: AC
  Filled 2020-12-23: qty 1

## 2020-12-23 MED ORDER — OXYTOCIN-SODIUM CHLORIDE 30-0.9 UT/500ML-% IV SOLN: 2.5000 [IU]/h | INTRAVENOUS | Status: AC

## 2020-12-23 MED ORDER — LABETALOL HCL 5 MG/ML IV SOLN
40.0000 mg | INTRAVENOUS | Status: DC | PRN
Start: 2020-12-23 — End: 2020-12-26

## 2020-12-23 MED ORDER — GABAPENTIN 100 MG PO CAPS
100.0000 mg | ORAL_CAPSULE | Freq: Two times a day (BID) | ORAL | Status: DC
  Administered 2020-12-23 – 2020-12-26 (×7): 100 mg via ORAL
  Filled 2020-12-23 (×7): qty 1

## 2020-12-23 MED ORDER — METOCLOPRAMIDE HCL 5 MG/ML IJ SOLN
INTRAMUSCULAR | Status: DC | PRN
  Administered 2020-12-23: 10 mg via INTRAVENOUS

## 2020-12-23 MED ORDER — GENTAMICIN SULFATE 40 MG/ML IJ SOLN
5.0000 mg/kg | INTRAVENOUS | Status: AC
  Administered 2020-12-23: 400 mg via INTRAVENOUS
  Filled 2020-12-23: qty 10

## 2020-12-23 MED ORDER — KETOROLAC TROMETHAMINE 30 MG/ML IJ SOLN
30.0000 mg | Freq: Four times a day (QID) | INTRAMUSCULAR | Status: AC | PRN
  Administered 2020-12-23: 30 mg via INTRAVENOUS

## 2020-12-23 MED ORDER — DIPHENHYDRAMINE HCL 50 MG/ML IJ SOLN: 12.5000 mg | INTRAMUSCULAR | Status: DC | PRN

## 2020-12-23 MED ORDER — OXYCODONE HCL 5 MG/5ML PO SOLN: 5.0000 mg | Freq: Once | ORAL | Status: DC | PRN

## 2020-12-23 MED ORDER — SCOPOLAMINE 1 MG/3DAYS TD PT72
1.0000 | MEDICATED_PATCH | Freq: Once | TRANSDERMAL | Status: AC
  Administered 2020-12-23: 1.5 mg via TRANSDERMAL

## 2020-12-23 MED ORDER — NALOXONE HCL 4 MG/10ML IJ SOLN
1.0000 ug/kg/h | INTRAMUSCULAR | Status: DC | PRN
Start: 2020-12-23 — End: 2020-12-26
  Filled 2020-12-23: qty 5

## 2020-12-23 MED ORDER — SOD CITRATE-CITRIC ACID 500-334 MG/5ML PO SOLN
ORAL | Status: AC
  Administered 2020-12-23: 30 mL
  Filled 2020-12-23: qty 15

## 2020-12-23 MED ORDER — LABETALOL HCL 5 MG/ML IV SOLN: 80.0000 mg | INTRAVENOUS | Status: DC | PRN

## 2020-12-23 MED ORDER — OXYCODONE HCL 5 MG PO TABS
5.0000 mg | ORAL_TABLET | ORAL | Status: DC | PRN
  Administered 2020-12-24: 5 mg via ORAL
  Administered 2020-12-24: 10 mg via ORAL
  Administered 2020-12-24: 5 mg via ORAL
  Administered 2020-12-25 – 2020-12-26 (×3): 10 mg via ORAL
  Filled 2020-12-23 (×2): qty 2
  Filled 2020-12-23: qty 1
  Filled 2020-12-23: qty 2
  Filled 2020-12-23: qty 1
  Filled 2020-12-23: qty 2

## 2020-12-23 SURGICAL SUPPLY — 48 items
BENZOIN TINCTURE PRP APPL 2/3 (GAUZE/BANDAGES/DRESSINGS) ×2 IMPLANT
CHLORAPREP W/TINT 26ML (MISCELLANEOUS) ×2 IMPLANT
CLAMP CORD UMBIL (MISCELLANEOUS) IMPLANT
CLOSURE STERI STRIP 1/2 X4 (GAUZE/BANDAGES/DRESSINGS) ×2 IMPLANT
CLOTH BEACON ORANGE TIMEOUT ST (SAFETY) ×2 IMPLANT
DRAPE C SECTION CLR SCREEN (DRAPES) IMPLANT
DRSG OPSITE POSTOP 4X10 (GAUZE/BANDAGES/DRESSINGS) ×2 IMPLANT
ELECT REM PT RETURN 9FT ADLT (ELECTROSURGICAL) ×2
ELECTRODE REM PT RTRN 9FT ADLT (ELECTROSURGICAL) ×1 IMPLANT
EXTRACTOR VACUUM M CUP 4 TUBE (SUCTIONS) IMPLANT
GLOVE BIO SURGEON STRL SZ7.5 (GLOVE) ×2 IMPLANT
GLOVE BIOGEL PI IND STRL 7.0 (GLOVE) ×1 IMPLANT
GLOVE BIOGEL PI INDICATOR 7.0 (GLOVE) ×1
GOWN STRL REUS W/TWL 2XL LVL3 (GOWN DISPOSABLE) ×2 IMPLANT
GOWN STRL REUS W/TWL LRG LVL3 (GOWN DISPOSABLE) ×4 IMPLANT
KIT ABG SYR 3ML LUER SLIP (SYRINGE) IMPLANT
MAT PREVALON FULL STRYKER (MISCELLANEOUS) ×2 IMPLANT
NEEDLE HYPO 22GX1.5 SAFETY (NEEDLE) ×2 IMPLANT
NEEDLE HYPO 25X5/8 SAFETYGLIDE (NEEDLE) IMPLANT
NS IRRIG 1000ML POUR BTL (IV SOLUTION) ×2 IMPLANT
PACK C SECTION WH (CUSTOM PROCEDURE TRAY) ×2 IMPLANT
PAD ABD 7.5X8 STRL (GAUZE/BANDAGES/DRESSINGS) ×2 IMPLANT
PAD OB MATERNITY 4.3X12.25 (PERSONAL CARE ITEMS) ×2 IMPLANT
PENCIL SMOKE EVAC W/HOLSTER (ELECTROSURGICAL) ×2 IMPLANT
RETRACTOR TRAXI PANNICULUS (MISCELLANEOUS) ×1 IMPLANT
RTRCTR C-SECT PINK 25CM LRG (MISCELLANEOUS) ×2 IMPLANT
SPONGE GAUZE 4X4 12PLY STER LF (GAUZE/BANDAGES/DRESSINGS) ×4 IMPLANT
STRIP CLOSURE SKIN 1/2X4 (GAUZE/BANDAGES/DRESSINGS) ×2 IMPLANT
SUT CHROMIC 1 CTX 36 (SUTURE) ×4 IMPLANT
SUT MNCRL 0 VIOLET CTX 36 (SUTURE) ×1 IMPLANT
SUT MONOCRYL 0 CTX 36 (SUTURE) ×1
SUT PLAIN 0 NONE (SUTURE) IMPLANT
SUT VIC AB 1 CT1 36 (SUTURE) ×2 IMPLANT
SUT VIC AB 1 CTX 36 (SUTURE) ×2
SUT VIC AB 1 CTX36XBRD ANBCTRL (SUTURE) ×2 IMPLANT
SUT VIC AB 2-0 CT1 (SUTURE) ×2 IMPLANT
SUT VIC AB 2-0 CT1 27 (SUTURE) ×1
SUT VIC AB 2-0 CT1 TAPERPNT 27 (SUTURE) ×1 IMPLANT
SUT VIC AB 3-0 CT1 27 (SUTURE) ×2
SUT VIC AB 3-0 CT1 TAPERPNT 27 (SUTURE) ×2 IMPLANT
SUT VIC AB 3-0 SH 27 (SUTURE)
SUT VIC AB 3-0 SH 27X BRD (SUTURE) IMPLANT
SUT VIC AB 4-0 KS 27 (SUTURE) ×4 IMPLANT
SYR BULB IRRIGATION 50ML (SYRINGE) IMPLANT
TOWEL OR 17X24 6PK STRL BLUE (TOWEL DISPOSABLE) ×2 IMPLANT
TRAXI PANNICULUS RETRACTOR (MISCELLANEOUS) ×1
TRAY FOLEY W/BAG SLVR 14FR LF (SET/KITS/TRAYS/PACK) ×2 IMPLANT
WATER STERILE IRR 1000ML POUR (IV SOLUTION) ×2 IMPLANT

## 2020-12-23 NOTE — Addendum Note (Signed)
Addendum  created 12/23/20 1735 by Graciela Husbands, CRNA   Intraprocedure Event edited

## 2020-12-23 NOTE — Progress Notes (Signed)
Daily Antepartum Note  Admission Date: 12/22/2020 Current Date: 12/23/2020 8:48 AM  Nichole Bush is a 25 y.o. G2P0101 @ [redacted]w[redacted]d, HD#2, admitted for severe pre-x.  Pregnancy complicated by: Patient Active Problem List   Diagnosis Date Noted  . Preeclampsia, severe 12/22/2020  . Abnormal fetal ultrasound 11/07/2020  . Supervision of other normal pregnancy, antepartum 07/26/2020  . History of pre-eclampsia in prior pregnancy, currently pregnant 07/26/2020  . History of cesarean delivery 07/26/2020  . Vitamin D deficiency 12/27/2018  . Henoch-Schonlein purpura (HCC) 02/26/2018  . Gastroesophageal reflux disease with esophagitis 09/18/2016  . Asthma 04/18/2016  . Hypothyroidism 06/25/2015    Overnight/24hr events:  ?Mg toxicity so Mg turned off and pt felt better. Has been off ever since  Subjective:  Still with mild HA but no other s/s.   Objective:    Current Vital Signs 24h Vital Sign Ranges  T 98 F (36.7 C) Temp  Avg: 98.3 F (36.8 C)  Min: 97.6 F (36.4 C)  Max: 99.3 F (37.4 C)  BP (!) 152/100 BP  Min: 137/88  Max: 166/98  HR 75 Pulse  Avg: 82.9  Min: 74  Max: 93  RR 20 Resp  Avg: 18.7  Min: 14  Max: 20  SaO2 100 % Room Air SpO2  Avg: 99.5 %  Min: 98 %  Max: 100 %       24 Hour I/O Current Shift I/O  Time Ins Outs 01/19 0701 - 01/20 0700 In: 2966.9 [P.O.:1438; I.V.:1478.9] Out: 1800 [Urine:1800] No intake/output data recorded.   Patient Vitals for the past 24 hrs:  BP Temp Temp src Pulse Resp SpO2 Height Weight  12/23/20 0800 (!) 152/100 98 F (36.7 C) Oral 75 20 100 % - -  12/23/20 0600 - - - - 20 - - -  12/23/20 0500 - - - - 18 - - -  12/23/20 0416 137/88 98 F (36.7 C) Oral 79 18 100 % - -  12/23/20 0400 - - - - 20 - - -  12/23/20 0300 - - - - 18 - - -  12/23/20 0200 - - - - 18 - - -  12/23/20 0100 - - - - 18 - - -  12/23/20 0000 (!) 149/95 97.6 F (36.4 C) Oral 74 18 98 % - -  12/22/20 2358 (!) 159/88 - - 77 - - - -  12/22/20 2300 (!) 151/99 - - 79 20  - - -  12/22/20 2200 (!) 152/93 - - 74 19 - - -  12/22/20 2100 (!) 150/85 - - 79 20 - - -  12/22/20 2022 (!) 148/95 - - 77 - - - -  12/22/20 2021 (!) 148/95 - - 82 19 - - -  12/22/20 2000 (!) 162/91 - - 88 20 - - -  12/22/20 1932 (!) 151/97 - - 83 19 100 % - -  12/22/20 1840 (!) 156/90 - - 83 19 100 % - -  12/22/20 1825 (!) 161/99 - - 80 19 100 % - -  12/22/20 1729 (!) 148/92 - - 78 20 100 % - -  12/22/20 1714 (!) 166/98 - - 82 20 - - -  12/22/20 1617 (!) 157/104 98.4 F (36.9 C) Oral 84 20 99 % 5\' 2"  (1.575 m) 126.6 kg  12/22/20 1601 (!) 155/99 - - 93 - - - -  12/22/20 1556 (!) 163/88 - - 84 - - - -  12/22/20 1551 (!) 152/96 - - 92 - - - -  12/22/20 1541 (!) 154/91 - - 86 19 100 % - -  12/22/20 1540 - - - - - 100 % - -  12/22/20 1531 (!) 152/85 - - 77 14 98 % - -  12/22/20 1417 (!) 159/95 - - 92 - 99 % - -  12/22/20 1401 (!) 150/102 - - 88 - - - -  12/22/20 1347 (!) 143/97 - - 88 - - - -  12/22/20 1317 (!) 156/86 - - 87 - - - -  12/22/20 1302 138/77 - - 84 - - - -  12/22/20 1244 (!) 149/98 99.3 F (37.4 C) Oral 93 15 100 % - -    Physical exam: General: Well nourished, well developed female in no acute distress. Abdomen: gravid nttp Cardiovascular: S1, S2 normal, no murmur, rub or gallop, regular rate and rhythm Respiratory: CTAB Extremities: no clubbing, cyanosis. 2 to 3+ edema in LE b/l. SCDs in place Skin: Warm and dry.   Medications: Current Facility-Administered Medications  Medication Dose Route Frequency Provider Last Rate Last Admin  . acetaminophen (TYLENOL) tablet 650 mg  650 mg Oral Q4H PRN Federico Flake, MD   650 mg at 12/23/20 0433  . calcium carbonate (TUMS - dosed in mg elemental calcium) chewable tablet 400 mg of elemental calcium  2 tablet Oral Q4H PRN Federico Flake, MD      . docusate sodium (COLACE) capsule 100 mg  100 mg Oral BID PRN [place] Bing, MD      . lactated ringers infusion   Intravenous Continuous Federico Flake,  MD 75 mL/hr at 12/23/20 0150 New Bag at 12/23/20 0150  . levothyroxine (SYNTHROID) tablet 50 mcg  50 mcg Oral QAC breakfast Federico Flake, MD      . magnesium sulfate 40 grams in SWI 1000 mL OB infusion  2 g/hr Intravenous Titrated Federico Flake, MD   Stopped at 12/22/20 2343  . prenatal multivitamin tablet 1 tablet  1 tablet Oral Q1200 Federico Flake, MD      . zolpidem [facility]) tablet 5 mg  5 mg Oral QHS PRN Federico Flake, MD        Labs:  Recent Labs  Lab 12/20/20 1132 12/22/20 0816 12/22/20 1232  WBC 8.4 7.3 9.0  HGB 12.5 12.7 12.8  HCT 35.4* 37.5 38.4  PLT 217 195 191    Recent Labs  Lab 12/20/20 1132 12/22/20 1232  NA 139 139  K 3.7 3.7  CL 107 109  CO2 22 21*  BUN 11 6  CREATININE 0.78 0.63  CALCIUM 8.8* 8.4*  PROT 4.6* 4.8*  BILITOT 0.5 0.5  ALKPHOS 175* 181*  ALT 13 17  AST 20 27  GLUCOSE 98 107*     Radiology:  No new imaging  Assessment & Plan:  Pt stable *Pregnancy: category I tracing. D/w her re: r/b, permanency, etc with BTL and she desires this. Pt consented for BTL *severe pre-x: UOP since MN. D/w her recommend starting Mg in the post op period as she may tolerate it better once delivered. D/w her re: rec for delivery today and she is amenable to repeat c-section; pt desires this *Preterm: will given rescue BMZ #2 dose early *PPx: SCDs *FEN/GI: NPO  Cornelia Copa MD Attending Center for [facility] (Faculty Practice) GYN Consult Phone: [phone] (M-F, 0800-1700) & [phone] (Off hours, weekends, holidays)

## 2020-12-23 NOTE — Anesthesia Procedure Notes (Signed)
Spinal  Patient location during procedure: OR Staffing Performed: anesthesiologist  Anesthesiologist: Laquinton Bihm E, MD Preanesthetic Checklist Completed: patient identified, IV checked, risks and benefits discussed, surgical consent, monitors and equipment checked, pre-op evaluation and timeout performed Spinal Block Patient position: sitting Prep: DuraPrep and site prepped and draped Patient monitoring: continuous pulse ox, blood pressure and heart rate Approach: midline Location: L3-4 Injection technique: single-shot Needle Needle type: Pencan  Needle gauge: 24 G Needle length: 9 cm Additional Notes Functioning IV was confirmed and monitors were applied. Sterile prep and drape, including hand hygiene and sterile gloves were used. The patient was positioned and the spine was prepped. The skin was anesthetized with lidocaine.  Free flow of clear CSF was obtained prior to injecting local anesthetic into the CSF. The needle was carefully withdrawn. The patient tolerated the procedure well.      

## 2020-12-23 NOTE — Anesthesia Preprocedure Evaluation (Signed)
Anesthesia Evaluation  Patient identified by MRN, date of birth, ID band Patient awake    Reviewed: Allergy & Precautions, H&P , NPO status , Patient's Chart, lab work & pertinent test results  History of Anesthesia Complications Negative for: history of anesthetic complications  Airway Mallampati: II  TM Distance: >3 FB Neck ROM: full    Dental no notable dental hx.    Pulmonary neg pulmonary ROS,    Pulmonary exam normal        Cardiovascular hypertension (severe pre-eclampsia), Normal cardiovascular exam     Neuro/Psych negative neurological ROS  negative psych ROS   GI/Hepatic negative GI ROS, Neg liver ROS,   Endo/Other  diabetesHypothyroidism Morbid obesity  Renal/GU negative Renal ROS  negative genitourinary   Musculoskeletal   Abdominal   Peds  Hematology negative hematology ROS (+)   Anesthesia Other Findings  29 wk IUGR with severe pre-eclampsia  Reproductive/Obstetrics (+) Pregnancy                             Anesthesia Physical Anesthesia Plan  ASA: IV and emergent  Anesthesia Plan: Spinal   Post-op Pain Management:    Induction:   PONV Risk Score and Plan: Ondansetron and Treatment may vary due to age or medical condition  Airway Management Planned:   Additional Equipment:   Intra-op Plan:   Post-operative Plan:   Informed Consent: I have reviewed the patients History and Physical, chart, labs and discussed the procedure including the risks, benefits and alternatives for the proposed anesthesia with the patient or authorized representative who has indicated his/her understanding and acceptance.       Plan Discussed with:   Anesthesia Plan Comments:         Anesthesia Quick Evaluation

## 2020-12-23 NOTE — Progress Notes (Signed)
Subjective: CTSP with report of fluctuating consciousness.  Pt previously had headache so magnesium infusion was decreased to 1 gram/ hour.    With report of consciousness issues, magnesium sulfate was stopped and a stat mag level was ordered.  Upon arrival, pt was awake but extremely drowsy.  She was able to talk but the effort seemed to tire her out.  As stated, the magnesium sulfate was completely stopped and calcium gluconate was ordered for infusion.  As infusion was started , pt became more alert and was able to answer questions easier.  she stated her headache was improving and it was easier for her to breathe.  At time of note mag level has not returned. Objective: Vital signs in last 24 hours: Temp:  [97.6 F (36.4 C)-99.3 F (37.4 C)] 97.6 F (36.4 C) (01/20 0000) Pulse Rate:  [74-93] 74 (01/20 0000) Resp:  [14-20] 18 (01/20 0000) BP: (138-166)/(77-116) 149/95 (01/20 0000) SpO2:  [98 %-100 %] 98 % (01/20 0000) Weight:  [126.6 kg] 126.6 kg (01/19 1617) Weight change:   Intake/Output from previous day: 01/19 0701 - 01/20 0700 In: 1989.3 [P.O.:1438; I.V.:551.3] Out: 1100 [Urine:1100] Intake/Output this shift: Total I/O In: 963 [P.O.:838; I.V.:125] Out: 900 [Urine:900]  General appearance: alert, distracted and mild distress FHT: 120s, moderate variability, no decels, accels noted Lab Results: Recent Labs    12/20/20 1132 12/22/20 1232  WBC 8.4 9.0  HGB 12.5 12.8  HCT 35.4* 38.4  PLT 217 191   BMET:  Recent Labs    12/20/20 1132 12/22/20 1232  NA 139 139  K 3.7 3.7  CL 107 109  CO2 22 21*  GLUCOSE 98 107*  BUN 11 6  CREATININE 0.78 0.63  CALCIUM 8.8* 8.4*   No results for input(s): PTH in the last 72 hours. Iron Studies: No results for input(s): IRON, TIBC, TRANSFERRIN, FERRITIN in the last 72 hours. Studies/Results: US MFM FETAL BPP WO NON STRESS  Result Date: 12/22/2020 ----------------------------------------------------------------------  OBSTETRICS  REPORT                       (Signed Final 12/22/2020 08:57 pm) ---------------------------------------------------------------------- Patient Info  ID #:       [id number]                          D.O.B.:  June 02, 1996 (24 yrs)  Name:       Nichole Bush                   Visit Date: 12/22/2020 05:32 pm ---------------------------------------------------------------------- Performed By  Attending:        Ma RingsVictor Fang [facility]         Secondary Phy.:    Nichole Bush  Performed By:     Nichole Bush          Address:           [address]                    [address]                                                              [address]  Referred By:      Nichole ShowersMARIE Bush  Mayford Knife       [facility] ---------------------------------------------------------------------- Orders  #  Description                           Code        Ordered By  1  [place] MFM FETAL BPP WO NON               [id number]    Nichole Bush     STRESS  2  [place] MFM UA CORD DOPPLER                76820.02    Nichole Bush ----------------------------------------------------------------------  #  Order #                     Accession #                Episode #  1  [id number]                   [id number]                 [id number]  2  [id number]                   [id number]                 [id number] ---------------------------------------------------------------------- Indications  Maternal care for known or suspected poor       O36.5931  fetal growth, third trimester, fetus 1 IUGR  [redacted] weeks gestation of pregnancy                 Z3A.29  Thyroid disease in pregnancy                    O99.280, E07.9  (hypothyroidism)  History of cesarean delivery, currently         O34.219  pregnant  Poor obstetric history: Previous                O09.299  preeclampsia / eclampsia/gestational HTN  Asthma                                          O99.89 j45.909  Poor obstetric history: Previous  gestational    O09.299  diabetes  LR NIPS/ - Neg AFP  Other umbilical cord                            O69.9XX0  complications,antepartum condition or  complication ---------------------------------------------------------------------- Fetal Evaluation  Num Of Fetuses:          1  Fetal Heart Rate(bpm):   166  Cardiac Activity:        Observed  Presentation:            Cephalic  Amniotic Fluid  AFI FV:      Within normal limits  AFI Sum(cm)     %  Tile       Largest Pocket(cm)  9.5             9           3.1  RUQ(cm)       RLQ(cm)       LUQ(cm)        LLQ(cm)  1.6           2             2.8            3.1 ---------------------------------------------------------------------- Biophysical Evaluation  Amniotic F.V:   Within normal limits       F. Tone:         Observed  F. Movement:    Observed                   Score:           8/8  F. Breathing:   Observed ---------------------------------------------------------------------- OB History  Blood Type:   B+  Gravidity:    2         Term:   0        Prem:   1        SAB:   0  TOP:          0       Ectopic:  0        Living: 1 ---------------------------------------------------------------------- Gestational Age  LMP:           29w 5d        Date:  05/28/20                 EDD:   03/04/21  Best:          29w 5d     Det. By:  LMP  (05/28/20)          EDD:   03/04/21 ---------------------------------------------------------------------- Doppler - Fetal Vessels  Umbilical Artery                                                              ADFV    RDFV                                                                Yes      No ---------------------------------------------------------------------- Comments  Nichole Bush is currently at 29 weeks and 5 days. She has  a known IUGR fetus with absent end-diastolic flow. She was  admitted this afternoon due to severe preeclampsia. Her  blood pressures were in the 150s over 100s range and she  was complaining of a headache. She also  has a 20 pound  weight gain over the past few days. Her PIH labs are within  normal limits.  Doppler studies of the umbilical arteries performed today  continues to show absent end-diastolic flow.  A biophysical profile performed today was 8 out of 8.  Low normal amniotic fluid with a total AFI of 9.5 cm was  noted.  As the patient is now complaining of  headaches along with  severe range blood pressures, I would recommend delivery  once she receives a rescue course of steroids.  To expedite delivery, the rescue steroid course may be given  12 hours apart.  She should be continued on magnesium sulfate for maternal  seizure prophylaxis and fetal neuroprotection. ----------------------------------------------------------------------                   Nichole Rings, Bush Electronically Signed Final Report   12/22/2020 08:57 pm ----------------------------------------------------------------------  [place] MFM UA Cord Doppler  Result Date: 12/22/2020 ----------------------------------------------------------------------  OBSTETRICS REPORT                       (Signed Final 12/22/2020 08:57 pm) ---------------------------------------------------------------------- Patient Info  ID #:       [id number]                          D.O.B.:  1996-09-24 (24 yrs)  Name:       Nichole Bush                   Visit Date: 12/22/2020 05:32 pm ---------------------------------------------------------------------- Performed By  Attending:        Ma Rings [facility]         Secondary Phy.:    [facility]  Performed By:     Nichole Horn          Address:           [address]                    [place]                                                              [address]d  Referred By:      Aviva Signs       [facility] ---------------------------------------------------------------------- Orders  #  Description                           Code         Ordered By  1  [place] MFM FETAL BPP WO NON               76819.01    Nichole Bush     STRESS  2  [place] MFM UA CORD DOPPLER                76820.02    [facility] ----------------------------------------------------------------------  #  Order #                     Accession #                Episode #  1  [id number]  [id number]                 [id number]  2  [id number]                   [id number]                 [id number] ---------------------------------------------------------------------- Indications  Maternal care for known or suspected poor       O36.5931  fetal growth, third trimester, fetus 1 IUGR  [redacted] weeks gestation of pregnancy                 Z3A.29  Thyroid disease in pregnancy                    O99.280, E07.9  (hypothyroidism)  History of cesarean delivery, currently         O34.219  pregnant  Poor obstetric history: Previous                O09.299  preeclampsia / eclampsia/gestational HTN  Asthma                                          O99.89 j45.909  Poor obstetric history: Previous gestational    O09.299  diabetes  LR NIPS/ - Neg AFP  Other umbilical cord                            O69.9XX0  complications,antepartum condition or  complication ---------------------------------------------------------------------- Fetal Evaluation  Num Of Fetuses:          1  Fetal Heart Rate(bpm):   166  Cardiac Activity:        Observed  Presentation:            Cephalic  Amniotic Fluid  AFI FV:      Within normal limits  AFI Sum(cm)     %Tile       Largest Pocket(cm)  9.5             9           3.1  RUQ(cm)       RLQ(cm)       LUQ(cm)        LLQ(cm)  1.6           2             2.8            3.1 ---------------------------------------------------------------------- Biophysical Evaluation  Amniotic F.V:   Within normal limits       F. Tone:         Observed  F. Movement:    Observed                   Score:           8/8  F. Breathing:   Observed  ---------------------------------------------------------------------- OB History  Blood Type:   B+  Gravidity:    2         Term:   0        Prem:   1        SAB:   0  TOP:          0       Ectopic:  0        Living: 1 ---------------------------------------------------------------------- Gestational Age  LMP:           29w 5d        Date:  05/28/20                 EDD:   03/04/21  Best:          29w 5d     Det. By:  LMP  (05/28/20)          EDD:   03/04/21 ---------------------------------------------------------------------- Doppler - Fetal Vessels  Umbilical Artery                                                              ADFV    RDFV                                                                Yes      No ---------------------------------------------------------------------- Comments  Nichole Slack is currently at 29 weeks and 5 days. She has  a known IUGR fetus with absent end-diastolic flow. She was  admitted this afternoon due to severe preeclampsia. Her  blood pressures were in the 150s over 100s range and she  was complaining of a headache. She also has a 20 pound  weight gain over the past few days. Her PIH labs are within  normal limits.  Doppler studies of the umbilical arteries performed today  continues to show absent end-diastolic flow.  A biophysical profile performed today was 8 out of 8.  Low normal amniotic fluid with a total AFI of 9.5 cm was  noted.  As the patient is now complaining of headaches along with  severe range blood pressures, I would recommend delivery  once she receives a rescue course of steroids.  To expedite delivery, the rescue steroid course may be given  12 hours apart.  She should be continued on magnesium sulfate for maternal  seizure prophylaxis and fetal neuroprotection. ----------------------------------------------------------------------                   Nichole Rings, Bush Electronically Signed Final Report   12/22/2020 08:57 pm  ----------------------------------------------------------------------   I have reviewed the patient's current medications.  Assessment/Plan: Clinically magnesium sulfate toxicity Magnesium sulfate is off, pt is receiving calcium gluconate Symptoms are improving Follow up on magnesium level Monitor blood pressure  Follow symptoms closely.    LOS: 1 day   Warden Fillers 12/23/2020,12:08 AM

## 2020-12-23 NOTE — Consult Note (Signed)
Delivery Note:  C-section       12/23/2020  11:27 AM  I was called to the operating room at the request of the patient's obstetrician (Dr. Pickens) for a repeat c-section at [redacted] weeks gestation.  PRENATAL HX: This is a 24-year-old G2 P0101 at 29 and 6/[redacted] weeks gestation who was admitted for preeclampsia.  Her pregnancy has been complicated by hypothyroidism, preeclampsia with previous delivery, previous C-section, IUGR with absent end-diastolic flow, and history of COVID (resolved).  Infant delivered by repeat C-section due to worsening preeclampsia.  AROM at delivery.  DELIVERY:  Infant was vigorous at delivery so cord clamping was delayed for 60 seconds.  She was taken to the warmer and heart rate was around 120 but respiratory effort was inconsistent.  CPAP +5, 30% applied but heart rate began to trend down downward as respiratory effort remained poor.  PPV initiated at around 2 minutes of age and given for 30 seconds.  Respiratory effort then improved and infant began to breathe regularly on CPAP +5.  Maximum FiO2 in the delivery room was 50%, but infant weaned down to 21% at the time of transport to NICU.  APGARs 6 and 8.  Parents were updated in the delivery room and father accompanied infant to NICU.  _____________________ Electronically Signed By: Katiria Calame, MD Neonatologist  

## 2020-12-23 NOTE — Transfer of Care (Signed)
Immediate Anesthesia Transfer of Care Note  Patient: Nichole Bush  Procedure(s) Performed: CESAREAN SECTION (N/A )  Patient Location: PACU  Anesthesia Type:Spinal  Level of Consciousness: awake, alert  and oriented  Airway & Oxygen Therapy: Patient Spontanous Breathing  Post-op Assessment: Report given to RN and Post -op Vital signs reviewed and stable  Post vital signs: Reviewed and stable  Last Vitals:  Vitals Value Taken Time  BP 129/84 12/23/20 1215  Temp    Pulse 78 12/23/20 1223  Resp 21 12/23/20 1223  SpO2 95 % 12/23/20 1223  Vitals shown include unvalidated device data.  Last Pain:  Vitals:   12/23/20 0800  TempSrc: Oral  PainSc: 3       Patients Stated Pain Goal: 3 (12/23/20 0800)  Complications: No complications documented.

## 2020-12-23 NOTE — Op Note (Addendum)
Operative Note   SURGERY DATE: 12/23/2020  PRE-OP DIAGNOSIS:  *Pregnancy at [redacted]w[redacted]d * PreEclampsia with Severe Features (BP, proteinuria, HA) *FGR with absent end diastolic flow * History of prior c section * Desires infertility *BMI 40s  POST-OP DIAGNOSIS: same. delivered    PROCEDURE: Repeat low transverse cesarean section with bilateral tubal ligation (modified parkland) via pfannenstiel skin incision with double layer uterine closure  SURGEON: Surgeon(s) and Role:    * Marsala, Arlana Pouch, MD - Assisting    * [place] Bing, MD  ANESTHESIA: spinal  ESTIMATED BLOOD LOSS:  333cc  DRAINS: UOP via indwelling foley  TOTAL IV FLUIDS: crystalloid  VTE PROPHYLAXIS: SCDs to bilateral lower extremities  ANTIBIOTICS: 900mg  clindamycin, 400mg  gentamicin, within 1 hour of skin incision  SPECIMENS: placenta, bilateral segments of fallopian tube  COMPLICATIONS: none  INDICATIONS: Per MFM recommendation  FINDINGS: No intra-abdominal adhesions were noted. Grossly normal uterus, tubes and ovaries. Clear amniotic fluid, cephali female infant, weight 1050gm, APGARs 6/8, intact placenta.  PROCEDURE IN DETAIL: The patient was taken to the operating room where anesthesia was administered and normal fetal heart tones were confirmed. She was then prepped and draped in the normal fashion in the dorsal supine position with a leftward tilt.  After a time out was performed, a pfannensteil  skin incision was made with the scalpel and carried through to the underlying layer of fascia. The fascia was then incised at the midline and this incision was extended laterally with the mayo scissors. Attention was turned to the superior aspect of the fascial incision which was grasped with the kocher clamps x 2, tented up and the rectus muscles were dissected off with the scalpel.The rectus muscles were then separated in the midline and the peritoneum was entered bluntly. The bladder blade was inserted  and the vesicouterine peritoneum was identified, but no bladder flap was created.   A low transverse hysterotomy was made with the scalpel until the endometrial cavity was breached and the amniotic sac ruptured with the Allis clamp, yielding clear amniotic fluid. This incision was extended bluntly and the infant's head, shoulders and body were delivered atraumatically.The cord was clamped x 2 and cut, and the infant was handed to the awaiting pediatricians, after delayed cord clamping was done.  The placenta was then gradually expressed from the uterus and then the uterus was exteriorized and cleared of all clots and debris. The hysterotomy was repaired with a running suture of 1-0 monocryl. A second imbricating layer of 1-0 monocryl suture was then placed. Several figure-of-eight sutures of monocryl were added to achieve excellent hemostasis.     The left Fallopian tube was identified by tracing out to the fimbraie, grasped with the Babcock clamps. An avascular midsection of the tube approximately 3-4cm from the cornua was grasped with the babcock clamps and the distal and proximal aspects were ligated with a suture of plain gut, with the intervening portion of tube was transected and removed, via the Metzenbaum scissors. Another suture tie was then placed below both stumps.  Attention was then turned to the right fallopian tube after confirmation by tracing the tube out to the fimbriae. The same procedure was then performed on the right Fallopian tube, with excellent hemostasis was noted from both BTL sites.    The uterus and adnexa were then returned to the abdomen, and the hysterotomy and all operative sites were reinspected and excellent hemostasis was noted after irrigation and suction of the abdomen with warm saline.  Two  figure of eight sutures were placed in the rectus with good hemostasis noted. The fascia was reapproximated with 0 Vicryl in a simple running fashion bilaterally. The subcutaneous  layer was then reapproximated with interrupted sutures of 2-0 plain gut, and the skin was then closed with 4-0 monocryl, in a subcuticular fashion. A honeycomb and pressure dressing were placed.   The patient  tolerated the procedure well. Sponge, lap, needle, and instrument counts were correct x 2. The patient was transferred to the recovery room awake, alert and breathing independently in stable condition.   Casper Harrison, MD [facility] Family Medicine Fellow, [facility] for [facility], [facility]   Agree with above. I was present and scrubbed for the entire procedure.   Cornelia Copa MD Attending Center for [employer] Midwife)

## 2020-12-23 NOTE — Progress Notes (Signed)
Patient having another episode of what occurred earlier this morning around 1207 while on magnesium sulfate, with intermittent loss of consciousness.  Able to talk.  Please refer to the note by Dr. Donavan Foil.  No signs of toxicity then, normal magnesium levels then. Currently, stable vital signs, no overtly concerning findings.  Will stop infusion again now, and monitor symptoms.   Jaynie Collins, MD

## 2020-12-23 NOTE — Lactation Note (Addendum)
This note was copied from a baby's chart. Lactation Consultation Note  Patient Name: Nichole Bush Date: 12/23/2020 Reason for consult: Initial assessment;NICU baby;Preterm <34wks Age:24 hours  Maternal Data Has patient been taught Hand Expression?: Yes Does the patient have breastfeeding experience prior to this delivery?: Yes  LC reported to Ms. Redder's room for initial consult. NICU booklet provided and LC set up DEBP and initiated pumping. Recommended Ms. Belmares pump 8+ times a day. Demonstrated hand expression and provided basic breast feeding education.  Belly band provided to make a pumping bra. Educated on how to clean pump equipment. All questions answered at this time.   Ms. Dull does not have a breast pump at home. She will call her insurance company to determine eligibility. I encouraged her to let us know if she would like help with obtaining a pump. She states that she will follow up.   Interventions Interventions: Breast feeding basics reviewed;DEBP  Lactation Tools Discussed/Used Tools: Pump;Flanges Flange Size: 27 Breast pump type: Double-Electric Breast Pump Pump Education: Setup, frequency, and cleaning Initiated by:: HL Date initiated:: 12/23/20   Consult Status Consult Status: Follow-up Date: 12/24/20 Follow-up type: In-patient    Walker Shadow 12/23/2020, 3:29 PM

## 2020-12-23 NOTE — Discharge Summary (Signed)
Postpartum Discharge Summary  Date of Service updated 12/26/20     Patient Name: Nichole Bush DOB: 1996/10/31 MRN: [id number]  Date of admission: 12/22/2020 Delivery date:12/23/2020  Delivering provider: Janet Berlin  Date of discharge: 12/26/2020  Admitting diagnosis: Preeclampsia, severe [O14.10] Intrauterine pregnancy: [redacted]w[redacted]d    Secondary diagnosis:  Active Problems:   Preeclampsia, severe   Cesarean delivery delivered  Additional problems: NA    Discharge diagnosis: Preterm Pregnancy Delivered and Preeclampsia (severe)                                              Post partum procedures:postpartum tubal ligation Augmentation: N/A Complications: None  Hospital course: Sceduled C/S   25y.o. yo G2P0202 at 270w6das admitted to the hospital 12/22/2020 for preeclampsia with severe features. She was started on magnesium. On 1/20 due to worsening symptoms, decision was made to proceed with delivery. She underwent scheduled scheduled cesarean section with the following indication:Elective Repeat.Delivery details are as follows:  Membrane Rupture Time/Date: 10:55 AM ,12/23/2020   Delivery Method:C-Section, Low Transverse  Details of operation can be found in separate operative note.  Patient had a unremarkable postpartum course. She received magnesium x 24 hours and was started on Norvasc for BP. She responded well She is ambulating, tolerating a regular diet, passing flatus, and urinating well. Patient is discharged home in stable condition on  12/26/20        Newborn Data: Birth date:12/23/2020  Birth time:10:55 AM  Gender:Female  Living status:Living  Apgars:6 ,8  Weight:1050 g     Magnesium Sulfate received: Yes: Seizure prophylaxis BMZ received: Yes Rhophylac:N/A MMR:N/A T-DaP:Given prenatally Flu: No Transfusion:No  Physical exam  Vitals:   12/25/20 1607 12/25/20 2012 12/26/20 0023 12/26/20 0345  BP: 131/75 128/78 130/73 132/80  Pulse: 80 94 92 77  Resp: 20 19  18 20   Temp: 98.1 F (36.7 C) 98.2 F (36.8 C) 98.1 F (36.7 C) 98.1 F (36.7 C)  TempSrc: Oral Oral Oral   SpO2: 100% 100% 100% 100%  Weight:      Height:       General: alert Lochia: appropriate Uterine Fundus: firm Incision: Healing well with no significant drainage DVT Evaluation: No evidence of DVT seen on physical exam. Labs: Lab Results  Component Value Date   WBC 15.4 (H) 12/24/2020   HGB 11.2 (L) 12/24/2020   HCT 34.1 (L) 12/24/2020   MCV 95.0 12/24/2020   PLT 233 12/24/2020   CMP Latest Ref Rng & Units 12/25/2020  Glucose 70 - 99 mg/dL 99  BUN 6 - 20 mg/dL 7  Creatinine 0.44 - 1.00 mg/dL 0.73  Sodium 135 - 145 mmol/L 139  Potassium 3.5 - 5.1 mmol/L 4.2  Chloride 98 - 111 mmol/L 106  CO2 22 - 32 mmol/L 24  Calcium 8.9 - 10.3 mg/dL 7.9(L)  Total Protein 6.5 - 8.1 g/dL 4.2(L)  Total Bilirubin 0.3 - 1.2 mg/dL 0.3  Alkaline Phos 38 - 126 U/L 112  AST 15 - 41 U/L 27  ALT 0 - 44 U/L 18   Edinburgh Score: Edinburgh Postnatal Depression Scale Screening Tool 12/24/2020  I have been able to laugh and see the funny side of things. 1  I have looked forward with enjoyment to things. 1  I have blamed myself unnecessarily when things went wrong. 1  I have  been anxious or worried for no good reason. 0  I have felt scared or panicky for no good reason. 0  Things have been getting on top of me. 1  I have been so unhappy that I have had difficulty sleeping. 1  I have felt sad or miserable. 0  I have been so unhappy that I have been crying. 0  The thought of harming myself has occurred to me. 0  Edinburgh Postnatal Depression Scale Total 5     After visit meds:  Allergies as of 12/26/2020      Reactions   Peanut-containing Drug Products Anaphylaxis   Penicillins Anaphylaxis, Hives, Other (See Comments)   Has patient had a PCN reaction causing immediate rash, facial/tongue/throat swelling, SOB or lightheadedness with hypotension: Yes Has patient had a PCN reaction  causing severe rash involving mucus membranes or skin necrosis: No Has patient had a PCN reaction that required hospitalization No Has patient had a PCN reaction occurring within the last 10 years: No If all of the above answers are "NO", then may proceed with Cephalosporin use.   Peanut Oil Hives      Medication List    STOP taking these medications   aspirin EC 81 MG tablet   bisacodyl 5 MG EC tablet Generic drug: bisacodyl   Butalbital-APAP-Caffeine 50-325-40 MG capsule   Comfort Fit Maternity Supp Med Misc   cyclobenzaprine 10 MG tablet Commonly known as: FLEXERIL   Medical Compression Stockings Misc   ondansetron 4 MG disintegrating tablet Commonly known as: Zofran ODT   pantoprazole 20 MG tablet Commonly known as: Protonix   promethazine 25 MG tablet Commonly known as: PHENERGAN     TAKE these medications   AMBULATORY NON FORMULARY MEDICATION 1 Device by Other route once a week. [place] pressure cuff /Large  Monitored Regularly at home  ICD 10: HROB O09.90   amLODipine 10 MG tablet Commonly known as: NORVASC Take 1 tablet (10 mg total) by mouth daily.   famotidine 20 MG tablet Commonly known as: PEPCID Take 20 mg by mouth 2 (two) times daily.   Flovent HFA 110 MCG/ACT inhaler Generic drug: fluticasone Inhale 2 puffs into the lungs daily.   ibuprofen 800 MG tablet Commonly known as: ADVIL Take 1 tablet (800 mg total) by mouth every 8 (eight) hours.   ipratropium-albuterol 0.5-2.5 (3) MG/3ML Soln Commonly known as: DUONEB Inhale into the lungs.   levothyroxine 50 MCG tablet Commonly known as: Synthroid Take 1 tablet (50 mcg total) by mouth daily before breakfast.   oxyCODONE 5 MG immediate release tablet Commonly known as: Oxy IR/ROXICODONE Take 1 tablet (5 mg total) by mouth every 4 (four) hours as needed for moderate pain.   polyethylene glycol powder 17 GM/SCOOP powder Commonly known as: GLYCOLAX/MIRALAX Take by mouth.   Prenate DHA  18-0.6-0.4-300 MG Caps Take 1 capsule by mouth daily.        Discharge home in stable condition Infant Feeding: Bottle and Breast Infant Disposition:home with mother Discharge instruction: per After Visit Summary and Postpartum booklet. Activity: Advance as tolerated. Pelvic rest for 6 weeks.  Diet: routine diet Future Appointments: Future Appointments  Date Time Provider [place]  12/30/2020  1:00 PM Truett Mainland, DO [facility] None  01/20/2021 10:15 AM Nehemiah Settle Tanna Savoy, DO [facility] None   Follow up Visit:  [place]. Schedule an appointment as soon as possible for a visit.   Specialty: Obstetrics and Gynecology  Why: 1 week for BP and incision check 4 weeks for PP visit Contact information: [place] [address] [phone]               Please schedule this patient for a In person postpartum visit in 4 weeks with the following provider: MD. Additional Postpartum F/U:Incision check 1 week and BP check 1 week  High risk pregnancy complicated by: PEC with severe features Delivery mode:  C-Section, Low Transverse  Anticipated Birth Control:  BTL done [facility]   12/26/2020 Chancy Milroy, MD

## 2020-12-23 NOTE — Anesthesia Postprocedure Evaluation (Signed)
Anesthesia Post Note  Patient: Nichole Bush  Procedure(s) Performed: CESAREAN SECTION (N/A )     Patient location during evaluation: PACU Anesthesia Type: Spinal Level of consciousness: oriented and awake and alert Pain management: pain level controlled Vital Signs Assessment: post-procedure vital signs reviewed and stable Respiratory status: spontaneous breathing, respiratory function stable and nonlabored ventilation Cardiovascular status: blood pressure returned to baseline and stable Postop Assessment: no headache, no backache, no apparent nausea or vomiting and spinal receding Anesthetic complications: no   No complications documented.  Last Vitals:  Vitals:   12/23/20 1600 12/23/20 1604  BP:  (!) 145/94  Pulse:  90  Resp:  19  Temp:  (!) 36.4 C  SpO2: 96% 94%    Last Pain:  Vitals:   12/23/20 1604  TempSrc: Oral  PainSc:    Pain Goal: Patients Stated Pain Goal: 3 (12/23/20 1320)                 Lucretia Kern

## 2020-12-24 ENCOUNTER — Other Ambulatory Visit: Payer: Self-pay | Admitting: Advanced Practice Midwife

## 2020-12-24 LAB — CBC
HCT: 34.1 % — ABNORMAL LOW (ref 36.0–46.0)
Hemoglobin: 11.2 g/dL — ABNORMAL LOW (ref 12.0–15.0)
MCH: 31.2 pg (ref 26.0–34.0)
MCHC: 32.8 g/dL (ref 30.0–36.0)
MCV: 95 fL (ref 80.0–100.0)
Platelets: 233 10*3/uL (ref 150–400)
RBC: 3.59 MIL/uL — ABNORMAL LOW (ref 3.87–5.11)
RDW: 13.8 % (ref 11.5–15.5)
WBC: 15.4 10*3/uL — ABNORMAL HIGH (ref 4.0–10.5)
nRBC: 0 % (ref 0.0–0.2)

## 2020-12-24 LAB — COMPREHENSIVE METABOLIC PANEL
ALT: 15 U/L (ref 0–44)
AST: 46 U/L — ABNORMAL HIGH (ref 15–41)
Albumin: 1.7 g/dL — ABNORMAL LOW (ref 3.5–5.0)
Alkaline Phosphatase: 138 U/L — ABNORMAL HIGH (ref 38–126)
Anion gap: 10 (ref 5–15)
BUN: 8 mg/dL (ref 6–20)
CO2: 24 mmol/L (ref 22–32)
Calcium: 8.1 mg/dL — ABNORMAL LOW (ref 8.9–10.3)
Chloride: 102 mmol/L (ref 98–111)
Creatinine, Ser: 0.74 mg/dL (ref 0.44–1.00)
GFR, Estimated: >60 mL/min (ref >60)
Glucose, Bld: 110 mg/dL — ABNORMAL HIGH (ref 70–99)
Potassium: 5.4 mmol/L — ABNORMAL HIGH (ref 3.5–5.1)
Sodium: 136 mmol/L (ref 135–145)
Total Bilirubin: 0.9 mg/dL (ref 0.3–1.2)
Total Protein: 4.6 g/dL — ABNORMAL LOW (ref 6.5–8.1)

## 2020-12-24 NOTE — Plan of Care (Signed)
Problem: Clinical Measurements: Goal: Cardiovascular complication will be avoided Outcome: Completed/Met   Problem: Nutrition: Goal: Adequate nutrition will be maintained Outcome: Completed/Met   Problem: Coping: Goal: Level of anxiety will decrease Outcome: Completed/Met   Problem: Elimination: Goal: Will not experience complications related to bowel motility Outcome: Completed/Met Goal: Will not experience complications related to urinary retention Outcome: Completed/Met   Problem: Education: Goal: Knowledge of disease or condition will improve Outcome: Completed/Met Goal: Knowledge of the prescribed therapeutic regimen will improve Outcome: Completed/Met Goal: Individualized Educational Video(s) Outcome: Completed/Met   Problem: Clinical Measurements: Goal: Complications related to the disease process, condition or treatment will be avoided or minimized Outcome: Completed/Met   Problem: Education: Goal: Knowledge of disease or condition will improve Outcome: Completed/Met Goal: Knowledge of the prescribed therapeutic regimen will improve Outcome: Completed/Met   Problem: Education: Goal: Knowledge of condition will improve Outcome: Completed/Met   Problem: Activity: Goal: Ability to tolerate increased activity will improve Outcome: Completed/Met   Problem: Coping: Goal: Ability to identify and utilize available resources and services will improve Outcome: Completed/Met   Problem: Life Cycle: Goal: Chance of risk for complications during the postpartum period will decrease Outcome: Completed/Met   Problem: Role Relationship: Goal: Ability to demonstrate positive interaction with newborn will improve Outcome: Completed/Met

## 2020-12-24 NOTE — Clinical Social Work Maternal (Signed)
CLINICAL SOCIAL WORK MATERNAL/CHILD NOTE  Patient Details  Name: Nichole Bush MRN: [id number] Date of Birth: [birth year]  Date:  12/24/2020  Clinical Social Worker Initiating Note:  Laurey Arrow Date/Time: Initiated:  12/24/20/1357     Child's Name:  Nichole Bush   Biological Parents:  Mother,Father   Need for Interpreter:  None   Reason for Referral:  Parental Support of Premature Babies < 32 weeks/or Critically Ill babies (Premature baby 29.6 weeks)   Address:  [place] [address]    Phone number:  [phone] (home)     Additional phone number: FOB's number is [phone] Household Members/Support Persons (HM/SP):   Household Member/Support Person 1,Household Member/Support Person 2   HM/SP Name Relationship DOB or Age  HM/SP -1 Nichole Bush FOB 11/28/90  HM/SP -2 Nichole Bush son 04/10/2019  HM/SP -3        HM/SP -4        HM/SP -5        HM/SP -6        HM/SP -7        HM/SP -8          Natural Supports (not living in the home):  Extended Family,Immediate Family,Parent (Per MOB, FOB's family will also provide supports.)   Professional Supports: None   Employment: Unemployed   Type of Work:     Education:  Nurse, adult   Homebound arranged:    Museum/gallery curator Resources:  Medicaid   Other Resources:  Dora Stamps     Cultural/Religious Considerations Which May Impact Care:  None reported  Strengths:  Ability to meet basic needs  ,Home prepared for child   (MOB will provide peds information prior to infant's discharge.)   Psychotropic Medications:         Pediatrician:       Pediatrician List:   [place]      Pediatrician Fax Number:    Risk Factors/Current Problems:      Cognitive State:  Alert  ,Insightful  ,Linear Thinking  ,Goal Oriented     Mood/Affect:  Happy  ,Bright  ,Interested  ,Comfortable   ,Relaxed     CSW Assessment: CSW met with MOB in 107 to introduce myself, offer support and complete assessment due to NICU admission at 29.6 weeks.  When CSW arrived, MOB and FOB were eating lunch. CSW offered to return at a later time and the couple declined. CSW explained CSW's role and MOB gave CSW permission to complete NCIU assessment while FOB was present. The couple was polite and appeared to be supportive of one another. MOB was pleasant and easy to engage.  She reported feeling well today and seems to have a good understanding of baby's medical situation and was able to update CSW on infant's status.   CSW asked MOB to share her story of labor and delivery as well as baby's admission to NICU and how she felt emotionally throughout her experience.  MOB was open to talking with CSW and sharing her feelings.  She states baby's admission to NICU was "bittersweet" because she didn't want to be away from her baby, but "knew she'd get the proper care there (NICU)."  MOB is keeping this (necessary care baby is receiving) as her focus and acknowledges the situation as temporary.  CSW assisted her in identifying strengths, which she  was able to.  She is pleased with baby's progress. FOB asked about staying at the [facility] and CSW explained that [employer] does not have a [place] for the couple to stay however shared information about the [employer] and resources offered by [employer].  The couple shared that MOB will "More than likely room in every 2 days and FOB will visit every 2 days."  CSW reviewed NICU visitation and encouraged the family to visit as often as possible. The couple expressed concerns about gas and CSW informed them about a gas resources offered by [facility]. The family expressed interest and CSW agreed to provide gas and food resources on 12/27/20.  The couple expressed appreciation and appeared grateful.  CSW provided education regarding PPD signs and symptoms to watch for  and asked that MOB commit to talking with CSW and or her doctor if symptoms arise at any time; MOB agreed.  CSW also discussed common emotions often experienced during the first two weeks after delivery, keeping in mind the separation that is inevitably caused by baby's admission to NICU.  MOB denies any hx of depression, although she states she has had "little baby blues" 2 weeks after having her oldest child that last for about 2 weeks. She reported that intervention was not necessary. CSW assessed for safety and MOB denied SI and HI.   MOB states she has a good support system and names FOB, her mother, her grandmother, and FOB's mother as main support people.  MOB reports having all needed baby supplies at with the exception of a car seat home and has chosen a pediatrician.  The couple communicated that plan to purchase a car seat prior to infant's discharge. The couple states no further questions, concerns or needs at this time.  CSW explained ongoing support services offered by NICU CSW and provided contact information.  The couple seemed very appreciative of the visit and thanked CSW.  CSW will continue to offer resources and supports to family while infant remains in NICU.    CSW Plan/Description:  Psychosocial Support and Ongoing Assessment of Needs,Sudden Infant Death Syndrome (SIDS) Education,Perinatal Mood and Anxiety Disorder (PMADs) Education,Other Patient/Family Education,Other Information/Referral to [employer], MSW, [employer] Social Work [phone]   Dimple Nanas, LCSW 12/24/2020, 2:01 PM

## 2020-12-24 NOTE — Plan of Care (Signed)
  Problem: Clinical Measurements: Goal: Cardiovascular complication will be avoided Outcome: Completed/Met   Problem: Nutrition: Goal: Adequate nutrition will be maintained Outcome: Completed/Met   Problem: Coping: Goal: Level of anxiety will decrease Outcome: Completed/Met   Problem: Elimination: Goal: Will not experience complications related to bowel motility Outcome: Completed/Met Goal: Will not experience complications related to urinary retention Outcome: Completed/Met   Problem: Education: Goal: Knowledge of disease or condition will improve Outcome: Completed/Met Goal: Knowledge of the prescribed therapeutic regimen will improve Outcome: Completed/Met   Problem: Activity: Goal: Ability to tolerate increased activity will improve Outcome: Completed/Met   Problem: Coping: Goal: Ability to identify and utilize available resources and services will improve Outcome: Completed/Met   Problem: Role Relationship: Goal: Ability to demonstrate positive interaction with newborn will improve Outcome: Completed/Met

## 2020-12-24 NOTE — Lactation Note (Signed)
This note was copied from a baby's chart. Lactation Consultation Note  Patient Name: Girl Laneya Gasaway [id number] Date: 12/24/2020   Age:24 hours  Mom reports she has been pumping and not getting anything.  Reviewed pumping for baby in the NICU.  Urged to add massage and hand expression to pumping.  LC asked  Mom if we could try hand expression.  Mom agreed.  LC able to get a few drops from the right.  A glistening from the left. Mom happy to see colostrum. Discussed continuing to pump 8-12 or more times a day and add massage and hand expression.  [facility] taught dad how to do hand expression and he was able to remove a few drops from the left breast as well. Praised moms efforts.  Urged her to call lactation as needed.   Maternal Data    Feeding    LATCH Score                   Interventions    Lactation Tools Discussed/Used     Consult Status      Nazaiah Navarrete Michaelle Copas 12/24/2020, 6:54 PM

## 2020-12-24 NOTE — Progress Notes (Signed)
Daily Postpartum Note  Admission Date: 12/22/2020 Current Date: 12/24/2020 4:05 PM  Otto Caraway is a 25 y.o. G2P0202 POD#1 s/p rLTCS and BTL ([place]), admitted for severe pre-eclampsia (HA, BP, PCR) and worsening FGR. @ 29/6.  Pregnancy complicated by: Patient Active Problem List   Diagnosis Date Noted  . Cesarean delivery delivered 12/23/2020  . Preeclampsia, severe 12/22/2020  . Abnormal fetal ultrasound 11/07/2020  . Supervision of other normal pregnancy, antepartum 07/26/2020  . History of pre-eclampsia in prior pregnancy, currently pregnant 07/26/2020  . History of cesarean delivery 07/26/2020  . Vitamin D deficiency 12/27/2018  . Henoch-Schonlein purpura (HCC) 02/26/2018  . Gastroesophageal reflux disease with esophagitis 09/18/2016  . Asthma 04/18/2016  . Hypothyroidism 06/25/2015    Overnight/24hr events:  Mg stopped due to another reaction.   Subjective:  No s/s of Mg toxicity or pre-eclampsia. No flatus yet.   Objective:    Current Vital Signs 24h Vital Sign Ranges  T 98.5 F (36.9 C) Temp  Avg: 98.1 F (36.7 C)  Min: 97.8 F (36.6 C)  Max: 98.5 F (36.9 C)  BP (!) 152/81 BP  Min: 120/91  Max: 157/101  HR 84 Pulse  Avg: 92.8  Min: 78  Max: 113  RR 18 Resp  Avg: 17.9  Min: 16  Max: 20  SaO2 100 % (room air) Room Air SpO2  Avg: 95.4 %  Min: 93 %  Max: 100 %       24 Hour I/O Current Shift I/O  Time Ins Outs 01/20 0701 - 01/21 0700 In: 4023.1 [P.O.:1320; I.V.:2593.1] Out: 1373 [Urine:1025] 01/21 0701 - 01/21 1900 In: 702.1 [P.O.:390; I.V.:312.1] Out: 1350 [Urine:1350]   Patient Vitals for the past 24 hrs:  BP Temp Temp src Pulse Resp SpO2  12/24/20 1603 (!) 152/81 -- -- 84 -- --  12/24/20 1235 (!) 152/89 98.5 F (36.9 C) Oral 98 18 100 %  12/24/20 0949 (!) 145/86 -- -- 78 -- --  12/24/20 0948 (!) 145/86 98.3 F (36.8 C) Oral 79 17 100 %  12/24/20 0308 127/68 97.8 F (36.6 C) Oral 80 16 100 %  12/24/20 0031 (!) 157/101 -- -- (!) 113 -- --   12/24/20 0023 (!) 145/77 -- -- (!) 102 -- --  12/24/20 0021 136/64 -- -- 93 -- --  12/23/20 2351 138/77 98.1 F (36.7 C) Oral 83 16 97 %  12/23/20 2055 -- -- -- -- -- 96 %  12/23/20 2050 -- -- -- -- -- 96 %  12/23/20 2045 -- -- -- -- -- 97 %  12/23/20 2043 (!) 120/91 -- -- (!) 104 -- --  12/23/20 2000 128/69 -- -- 98 20 --  12/23/20 1950 -- -- -- -- -- 95 %  12/23/20 1945 -- -- -- -- -- 94 %  12/23/20 1940 -- -- -- -- -- 96 %  12/23/20 1935 -- -- -- -- -- 95 %  12/23/20 1930 -- -- -- -- -- 95 %  12/23/20 1925 -- -- -- -- -- 96 %  12/23/20 1920 -- -- -- -- -- 95 %  12/23/20 1915 -- -- -- -- -- 94 %  12/23/20 1910 127/73 -- -- 98 18 94 %  12/23/20 1905 -- -- -- -- -- 95 %  12/23/20 1900 -- -- -- -- -- 95 %  12/23/20 1855 -- -- -- -- -- 94 %  12/23/20 1850 -- -- -- -- -- 95 %  12/23/20 1845 -- -- -- -- -- 94 %  12/23/20  1840 -- -- -- -- -- 93 %  12/23/20 1835 -- -- -- -- -- 94 %  12/23/20 1830 136/76 -- -- 95 19 95 %  12/23/20 1825 -- -- -- -- -- 95 %  12/23/20 1820 -- -- -- -- -- 95 %  12/23/20 1815 -- -- -- -- -- 94 %  12/23/20 1810 -- -- -- -- -- 93 %  12/23/20 1805 -- -- -- -- -- 94 %  12/23/20 1800 -- -- -- -- -- 95 %  12/23/20 1715 130/78 -- -- 91 19 95 %  12/23/20 1614 131/85 97.9 F (36.6 C) Oral 96 18 96 %    Physical exam: General: Well nourished, well developed female in no acute distress. Abdomen: gravid nttp +bs, c/d/i dressing Cardiovascular: S1, S2 normal, no murmur, rub or gallop, regular rate and rhythm Respiratory: CTAB Extremities: no clubbing, cyanosis or edema Skin: Warm and dry.   Medications: Current Facility-Administered Medications  Medication Dose Route Frequency Provider Last Rate Last Admin  . acetaminophen (TYLENOL) tablet 650 mg  650 mg Oral Q4H PRN [place] Bing, MD   650 mg at 12/24/20 2536  . amLODipine (NORVASC) tablet 10 mg  10 mg Oral Daily [place] Bing, MD   10 mg at 12/24/20 0939  . calcium carbonate (TUMS - dosed in mg  elemental calcium) chewable tablet 400 mg of elemental calcium  2 tablet Oral Q4H PRN Federico Flake, MD      . clindamycin (CLEOCIN) IVPB 900 mg  900 mg Intravenous 60 min Pre-Op [place] Bing, MD      . coconut oil  1 application Topical PRN [place] Bing, MD   1 application at 12/24/20 [phone]  . witch hazel-glycerin (TUCKS) pad 1 application  1 application Topical PRN [place] Bing, MD       And  . dibucaine (NUPERCAINAL) 1 % rectal ointment 1 application  1 application Rectal PRN [place] Bing, MD      . diphenhydrAMINE (BENADRYL) injection 12.5 mg  12.5 mg Intravenous Q4H PRN Lucretia Kern, MD       Or  . diphenhydrAMINE (BENADRYL) capsule 25 mg  25 mg Oral Q4H PRN Lucretia Kern, MD      . diphenhydrAMINE (BENADRYL) capsule 25 mg  25 mg Oral Q6H PRN [place] Bing, MD      . enoxaparin (LOVENOX) injection 60 mg  60 mg Subcutaneous Q24H [place] Bing, MD   60 mg at 12/24/20 0616  . gabapentin (NEURONTIN) capsule 100 mg  100 mg Oral BID [place] Bing, MD   100 mg at 12/24/20 0939  . labetalol (NORMODYNE) injection 20 mg  20 mg Intravenous PRN [place] Bing, MD       And  . labetalol (NORMODYNE) injection 40 mg  40 mg Intravenous PRN [place] Bing, MD       And  . labetalol (NORMODYNE) injection 80 mg  80 mg Intravenous PRN [place] Bing, MD       And  . hydrALAZINE (APRESOLINE) injection 10 mg  10 mg Intravenous PRN  Bing, MD      . ibuprofen (ADVIL) tablet 800 mg  800 mg Oral Q8H  Bing, MD   800 mg at 12/24/20 1220  . lactated ringers infusion   Intravenous Continuous  Bing, MD   Stopped at 12/24/20 0815  . levothyroxine (SYNTHROID) tablet 50 mcg  50 mcg Oral QAC breakfast Federico Flake, MD   50 mcg at 12/24/20 [phone]  . magnesium sulfate 40 grams  in SWI 1000 mL OB infusion  2 g/hr Intravenous Continuous [place] Bing, MD   Stopping Infusion hung by another clincian at 12/23/20 2045  .  menthol-cetylpyridinium (CEPACOL) lozenge 3 mg  1 lozenge Oral Q2H PRN [place] Bing, MD      . nalbuphine (NUBAIN) injection 5 mg  5 mg Intravenous Q4H PRN Lucretia Kern, MD       Or  . nalbuphine (NUBAIN) injection 5 mg  5 mg Subcutaneous Q4H PRN Lucretia Kern, MD      . nalbuphine (NUBAIN) injection 5 mg  5 mg Intravenous Once PRN Lucretia Kern, MD       Or  . nalbuphine (NUBAIN) injection 5 mg  5 mg Subcutaneous Once PRN Lucretia Kern, MD      . naloxone [facility]) injection 0.4 mg  0.4 mg Intravenous PRN Lucretia Kern, MD       And  . sodium chloride flush (NS) 0.9 % injection 3 mL  3 mL Intravenous PRN Lucretia Kern, MD      . naloxone HCl (NARCAN) 2 mg in dextrose 5 % 250 mL infusion  1-4 mcg/kg/hr Intravenous Continuous PRN Lucretia Kern, MD      . ondansetron (ZOFRAN) injection 4 mg  4 mg Intravenous Q8H PRN Lucretia Kern, MD      . oxyCODONE (Oxy IR/ROXICODONE) immediate release tablet 5-10 mg  5-10 mg Oral Q4H PRN [place] Bing, MD   5 mg at 12/24/20 1222  . polyethylene glycol (MIRALAX / GLYCOLAX) packet 17 g  17 g Oral Daily [place] Bing, MD   17 g at 12/24/20 7169  . prenatal multivitamin tablet 1 tablet  1 tablet Oral Q1200 [place] Bing, MD   1 tablet at 12/24/20 1220  . scopolamine (TRANSDERM-SCOP) 1 MG/3DAYS 1.5 mg  1 patch Transdermal Once Lucretia Kern, MD   1.5 mg at 12/23/20 1221  . simethicone (MYLICON) chewable tablet 80 mg  80 mg Oral TID PC [place] Bing, MD   80 mg at 12/24/20 1220  . Tdap (BOOSTRIX) injection 0.5 mL  0.5 mL Intramuscular Once [place] Bing, MD      . zolpidem (AMBIEN) tablet 5 mg  5 mg Oral QHS PRN Federico Flake, MD        Labs:  Recent Labs  Lab 12/22/20 1232 12/23/20 0757 12/24/20 0919  WBC 9.0 7.1 15.4*  HGB 12.8 13.6 11.2*  HCT 38.4 41.0 34.1*  PLT 191 217 233    Recent Labs  Lab 12/22/20 1232 12/23/20 0757 12/24/20 0612  NA 139 137 136  K 3.7 4.2 5.4*  CL 109 107 102   CO2 21* 21* 24  BUN 6 6 8   CREATININE 0.63 0.68 0.74  CALCIUM 8.4* 8.1* 8.1*  PROT 4.8* 5.1* 4.6*  BILITOT 0.5 0.6 0.9  ALKPHOS 181* 204* 138*  ALT 17 17 15   AST 27 26 46*  GLUCOSE 107* 105* 110*     Radiology:  No new imaging  Assessment & Plan:  Pt doing well *PP: routine care. B POS *Severe pre-eclampsia: watch BPs. Rpt cmp tomorrow  MD Attending Center for [facility]) GYN Consult Phone: [phone] (M-F, 0800-1700) & [phone] (Off hours, weekends, holidays)

## 2020-12-25 LAB — COMPREHENSIVE METABOLIC PANEL
ALT: 18 U/L (ref 0–44)
AST: 27 U/L (ref 15–41)
Albumin: 1.6 g/dL — ABNORMAL LOW (ref 3.5–5.0)
Alkaline Phosphatase: 112 U/L (ref 38–126)
Anion gap: 9 (ref 5–15)
BUN: 7 mg/dL (ref 6–20)
CO2: 24 mmol/L (ref 22–32)
Calcium: 7.9 mg/dL — ABNORMAL LOW (ref 8.9–10.3)
Chloride: 106 mmol/L (ref 98–111)
Creatinine, Ser: 0.73 mg/dL (ref 0.44–1.00)
GFR, Estimated: >60 mL/min (ref >60)
Glucose, Bld: 99 mg/dL (ref 70–99)
Potassium: 4.2 mmol/L (ref 3.5–5.1)
Sodium: 139 mmol/L (ref 135–145)
Total Bilirubin: 0.3 mg/dL (ref 0.3–1.2)
Total Protein: 4.2 g/dL — ABNORMAL LOW (ref 6.5–8.1)

## 2020-12-25 MED ORDER — FUROSEMIDE 10 MG/ML IJ SOLN
20.0000 mg | Freq: Once | INTRAMUSCULAR | Status: AC
  Administered 2020-12-25: 20 mg via INTRAVENOUS
  Filled 2020-12-25: qty 2

## 2020-12-25 NOTE — Lactation Note (Signed)
This note was copied from a baby's chart. Lactation Consultation Note  Patient Name: Nichole Bush BTDHR'C Date: 12/25/2020 Reason for consult: Follow-up assessment;NICU baby Age:24 hours LC to mother's room for f/u visit. Reviewed hand expression and importance of frequent breast expressions to bring in milk supply. Assisted mother with pumping during visit and provided additional colostrum collection containers at her request. Patient was provided with the opportunity to ask questions. All concerns were addressed.  Will plan follow up visit.   Consult Status Consult Status: Follow-up Follow-up type: In-patient    Elder Negus, MA IBCLC 12/25/2020, 9:33 AM

## 2020-12-25 NOTE — Progress Notes (Signed)
Subjective: Postpartum Day 2:Repeat Cesarean Delivery with BTL at 29 6/7 weeks d/t SPEC with IUGR and AEDF  Patient reports some pain this morning, but pain medication helps. Ambulating, tolerating diet, Denies HA or visual changes  Objective: Vital signs in last 24 hours: Temp:  [97.8 F (36.6 C)-98.5 F (36.9 C)] 97.9 F (36.6 C) (01/22 0724) Pulse Rate:  [81-100] 87 (01/22 0724) Resp:  [18-20] 20 (01/22 0724) BP: (133-152)/(80-93) 140/93 (01/22 0724) SpO2:  [100 %] 100 % (01/22 0724)  Physical Exam:  General: alert Lochia: appropriate Uterine Fundus: firm Incision: Dressing in place DVT Evaluation: No evidence of DVT seen on physical exam.  Recent Labs    12/23/20 0757 12/24/20 0919  HGB 13.6 11.2*  HCT 41.0 34.1*    Assessment/Plan: Status post Cesarean section. Doing well postoperatively. BP stable on Norvasc. Pt encouraged to take shower and remove abd dressing. Will give dose of Lasix x 1 for fluid retention. LFT's normal today. Continue current care. Plan discharge home tomorrow.  Hermina Staggers 12/25/2020, 10:19 AM

## 2020-12-26 MED ORDER — AMLODIPINE BESYLATE 10 MG PO TABS: 10.0000 mg | ORAL_TABLET | Freq: Every day | ORAL | 1 refills | Status: AC

## 2020-12-26 MED ORDER — IBUPROFEN 800 MG PO TABS: 800.0000 mg | ORAL_TABLET | Freq: Three times a day (TID) | ORAL | 1 refills | Status: AC

## 2020-12-26 MED ORDER — OXYCODONE HCL 5 MG PO TABS: 5.0000 mg | ORAL_TABLET | ORAL | 0 refills | Status: DC | PRN

## 2020-12-26 NOTE — Discharge Instructions (Signed)
Cesarean Delivery, Care After This sheet gives you information about how to care for yourself after your procedure. Your health care provider may also give you more specific instructions. If you have problems or questions, contact your health care provider. What can I expect after the procedure? After the procedure, it is common to have:  A small amount of blood or clear fluid coming from the incision.  Some redness, swelling, and pain in your incision area.  Some abdominal pain and soreness.  Vaginal bleeding (lochia). Even though you did not have a vaginal delivery, you will still have vaginal bleeding and discharge.  Pelvic cramps.  Fatigue. You may have pain, swelling, and discomfort in the tissue between your vagina and your anus (perineum) if:  Your C-section was unplanned, and you were allowed to labor and push.  An incision was made in the area (episiotomy) or the tissue tore during attempted vaginal delivery. Follow these instructions at home: Incision care  Follow instructions from your health care provider about how to take care of your incision. Make sure you: ? Wash your hands with soap and water before you change your bandage (dressing). If soap and water are not available, use hand sanitizer. ? If you have a dressing, change it or remove it as told by your health care provider. ? Leave stitches (sutures), skin staples, skin glue, or adhesive strips in place. These skin closures may need to stay in place for 2 weeks or longer. If adhesive strip edges start to loosen and curl up, you may trim the loose edges. Do not remove adhesive strips completely unless your health care provider tells you to do that.  Check your incision area every day for signs of infection. Check for: ? More redness, swelling, or pain. ? More fluid or blood. ? Warmth. ? Pus or a bad smell.  Do not take baths, swim, or use a hot tub until your health care provider says it's okay. Ask your health  care provider if you can take showers.  When you cough or sneeze, hug a pillow. This helps with pain and decreases the chance of your incision opening up (dehiscing). Do this until your incision heals.   Medicines  Take over-the-counter and prescription medicines only as told by your health care provider.  If you were prescribed an antibiotic medicine, take it as told by your health care provider. Do not stop taking the antibiotic even if you start to feel better.  Do not drive or use heavy machinery while taking prescription pain medicine. Lifestyle  Do not drink alcohol. This is especially important if you are breastfeeding or taking pain medicine.  Do not use any products that contain nicotine or tobacco, such as cigarettes, e-cigarettes, and chewing tobacco. If you need help quitting, ask your health care provider. Eating and drinking  Drink at least 8 eight-ounce glasses of water every day unless told not to by your health care provider. If you breastfeed, you may need to drink even more water.  Eat high-fiber foods every day. These foods may help prevent or relieve constipation. High-fiber foods include: ? Whole grain cereals and breads. ? Brown rice. ? Beans. ? Fresh fruits and vegetables. Activity  If possible, have someone help you care for your baby and help with household activities for at least a few days after you leave the hospital.  Return to your normal activities as told by your health care provider. Ask your health care provider what activities are safe for   you.  Rest as much as possible. Try to rest or take a nap while your baby is sleeping.  Do not lift anything that is heavier than 10 lbs (4.5 kg), or the limit that you were told, until your health care provider says that it is safe.  Talk with your health care provider about when you can engage in sexual activity. This may depend on your: ? Risk of infection. ? How fast you heal. ? Comfort and desire to  engage in sexual activity.   General instructions  Do not use tampons or douches until your health care provider approves.  Wear loose, comfortable clothing and a supportive and well-fitting bra.  Keep your perineum clean and dry. Wipe from front to back when you use the toilet.  If you pass a blood clot, save it and call your health care provider to discuss. Do not flush blood clots down the toilet before you get instructions from your health care provider.  Keep all follow-up visits for you and your baby as told by your health care provider. This is important. Contact a health care provider if:  You have: ? A fever. ? Bad-smelling vaginal discharge. ? Pus or a bad smell coming from your incision. ? Difficulty or pain when urinating. ? A sudden increase or decrease in the frequency of your bowel movements. ? More redness, swelling, or pain around your incision. ? More fluid or blood coming from your incision. ? A rash. ? Nausea. ? Little or no interest in activities you used to enjoy. ? Questions about caring for yourself or your baby.  Your incision feels warm to the touch.  Your breasts turn red or become painful or hard.  You feel unusually sad or worried.  You vomit.  You pass a blood clot from your vagina.  You urinate more than usual.  You are dizzy or light-headed. Get help right away if:  You have: ? Pain that does not go away or get better with medicine. ? Chest pain. ? Difficulty breathing. ? Blurred vision or spots in your vision. ? Thoughts about hurting yourself or your baby. ? New pain in your abdomen or in one of your legs. ? A severe headache.  You faint.  You bleed from your vagina so much that you fill more than one sanitary pad in one hour. Bleeding should not be heavier than your heaviest period. Summary  After the procedure, it is common to have pain at your incision site, abdominal cramping, and slight bleeding from your vagina.  Check  your incision area every day for signs of infection.  Tell your health care provider about any unusual symptoms.  Keep all follow-up visits for you and your baby as told by your health care provider. This information is not intended to replace advice given to you by your health care provider. Make sure you discuss any questions you have with your health care provider. Document Revised: 05/29/2018 Document Reviewed: 05/29/2018 Elsevier Patient Education  2021 Elsevier Inc.  

## 2020-12-26 NOTE — Lactation Note (Signed)
This note was copied from a baby's chart. Lactation Consultation Note  Patient Name: Girl Roxann Vierra ZJIRC'V Date: 12/26/2020 Reason for consult: NICU baby;Follow-up assessment;Preterm <34wks Age:24 hours  LC to mom's room for f/u visit. She will d/c today and remain in infant's room. Patient pumped/hand expressed 6 times yesterday. Encouraged 8 expressions today. Per patient, her breast pump will arrive on Tuesday. She will use a hand pump prn if she leaves the hospital between today and its arrival. Patient was provided with the opportunity to ask questions. All concerns were addressed.  Will plan follow up visit.   Consult Status Consult Status: Follow-up Follow-up type: In-patient   Elder Negus, MA IBCLC 12/26/2020, 10:55 AM

## 2020-12-27 ENCOUNTER — Ambulatory Visit: Payer: Medicaid Other

## 2020-12-27 LAB — SURGICAL PATHOLOGY

## 2020-12-30 ENCOUNTER — Encounter (HOSPITAL_COMMUNITY): Payer: Self-pay | Admitting: Obstetrics & Gynecology

## 2020-12-30 ENCOUNTER — Ambulatory Visit: Payer: Medicaid Other

## 2020-12-30 ENCOUNTER — Ambulatory Visit: Payer: Medicaid Other | Admitting: Family Medicine

## 2020-12-30 ENCOUNTER — Inpatient Hospital Stay (HOSPITAL_COMMUNITY)
Admission: AD | Admit: 2020-12-30 | Discharge: 2020-12-30 | Disposition: A | Discharge status: Home / Self Care | Payer: Medicaid Other | Attending: Obstetrics & Gynecology | Admitting: Obstetrics & Gynecology

## 2020-12-30 ENCOUNTER — Other Ambulatory Visit: Payer: Self-pay

## 2020-12-30 DIAGNOSIS — Z4889 Encounter for other specified surgical aftercare: Secondary | ICD-10-CM

## 2020-12-30 DIAGNOSIS — O9089 Other complications of the puerperium, not elsewhere classified: Secondary | ICD-10-CM | POA: Diagnosis not present

## 2020-12-30 DIAGNOSIS — L24A9 Irritant contact dermatitis due friction or contact with other specified body fluids: Secondary | ICD-10-CM

## 2020-12-30 NOTE — MAU Note (Signed)
Patient signed printed AVS. Placed in chart. 

## 2020-12-30 NOTE — MAU Provider Note (Cosign Needed Addendum)
Chief Complaint:  No chief complaint on file.   Event Date/Time   First Provider Initiated Contact with Patient 12/30/20 0322     HPI: Nichole Bush is a 25 y.o. [id number] who presents to maternity admissions reporting leaking from her incision.  It was a watery red fluid.  Incision feels numb.  Has only happened once. She reports light vaginal bleeding, no vaginal itching/burning, urinary symptoms, h/a, dizziness, n/v, or fever/chills.    Wound Check Prior ED Treatment: normal dry dressing removed after surgery. Her temperature was unmeasured prior to arrival. There has been bloody discharge from the wound. There is no redness present. There is no swelling present. There is no pain present.   RN Note: Nichole Bush is a 25 y.o. at 7 days post partum c/s here in MAU reporting: "I woke up at 0132 with blood on my pants and my c-section scar was bleeding." Patient reports she is not experiencing any pain and states that it is numb and feels pressure. Patient reports scant light pink vaginal bleeding.  Past Medical History: Past Medical History:  Diagnosis Date  . Constipation   . COVID-19 affecting pregnancy, antepartum 12/09/2020  . Gestational diabetes   . 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 Hypertension   . Migraine   . Thyroid disease     Past obstetric history: OB History  Gravida Para Term Preterm AB Living  2 2   2   2   SAB IAB Ectopic Multiple Live Births        0 2    # Outcome Date GA Lbr Len/2nd Weight Sex Delivery Anes PTL Lv  2 Preterm 12/23/20 [redacted]w[redacted]d 1050 g F CS-LTranv Spinal  LIV  1 Preterm 04/10/19 [id number] / 02:46 2470 g M CS-LTranv EPI  LIV     Birth Comments: wnl    Past Surgical History: Past Surgical History:  Procedure Laterality Date  . CESAREAN SECTION N/A 04/10/2019   Procedure: CESAREAN SECTION;  Surgeon: PrDonnamae JudeMD;  Location: [facility] LD ORS;  Service: Obstetrics;  Laterality: N/A;  . CESAREAN SECTION N/A 12/23/2020   Procedure: CESAREAN  SECTION;  Surgeon: ErChancy MilroyMD;  Location: [facility] LD ORS;  Service: Obstetrics;  Laterality: N/A;  . ESOPHAGOGASTRODUODENOSCOPY    . WISDOM TOOTH EXTRACTION      Family History: Family History  Problem Relation Age of Onset  . Migraines Father   . Hypertension Father     Social History: Social History   Tobacco Use  . Smoking status: Never Smoker  . Smokeless tobacco: Never Used  Vaping Use  . Vaping Use: Former  . Substances: CBD  Substance Use Topics  . Alcohol use: No    Alcohol/week: 0.0 standard drinks  . Drug use: No    Allergies:  Allergies  Allergen Reactions  . Peanut-Containing Drug Products Anaphylaxis  . Penicillins Anaphylaxis, Hives and Other (See Comments)    Has patient had a PCN reaction causing immediate rash, facial/tongue/throat swelling, SOB or lightheadedness with hypotension: Yes Has patient had a PCN reaction causing severe rash involving mucus membranes or skin necrosis: No Has patient had a PCN reaction that required hospitalization No Has patient had a PCN reaction occurring within the last 10 years: No If all of the above answers are "NO", then may proceed with Cephalosporin use.   . Marland Kitcheneanut Oil Hives    Meds:  Medications Prior to Admission  Medication Sig Dispense Refill Last Dose  . amLODipine (NORVASC)  10 MG tablet Take 1 tablet (10 mg total) by mouth daily. 30 tablet 1 12/29/2020 at Unknown time  . ibuprofen (ADVIL) 800 MG tablet Take 1 tablet (800 mg total) by mouth every 8 (eight) hours. 30 tablet 1 12/29/2020 at Unknown time  . levothyroxine (SYNTHROID) 50 MCG tablet Take 1 tablet (50 mcg total) by mouth daily before breakfast. 30 tablet 1 12/29/2020 at Unknown time  . AMBULATORY NON FORMULARY MEDICATION 1 Device by Other route once a week. [place] pressure cuff /Large  Monitored Regularly at home  ICD 10: HROB O09.90 1 kit 0   . famotidine (PEPCID) 20 MG tablet Take 20 mg by mouth 2 (two) times daily.     . fluticasone (FLOVENT  HFA) 110 MCG/ACT inhaler Inhale 2 puffs into the lungs daily. 1 each 12   . ipratropium-albuterol (DUONEB) 0.5-2.5 (3) MG/3ML SOLN Inhale into the lungs.     [place] Kitchen oxyCODONE (OXY IR/ROXICODONE) 5 MG immediate release tablet Take 1 tablet (5 mg total) by mouth every 4 (four) hours as needed for moderate pain. 30 tablet 0   . polyethylene glycol powder (GLYCOLAX/MIRALAX) 17 GM/SCOOP powder Take by mouth.     . Prenat-FeAsp-Meth-FA-DHA w/o A (PRENATE DHA) 18-0.6-0.4-300 MG CAPS Take 1 capsule by mouth daily. 30 capsule 11     I have reviewed patient's Past Medical Hx, Surgical Hx, Family Hx, Social Hx, medications and allergies.  ROS:  Review of Systems  Constitutional: Negative for chills and fever.  Eyes: Negative for visual disturbance.  Respiratory: Negative for shortness of breath.   Gastrointestinal: Negative for abdominal pain, constipation, diarrhea and nausea.       Watery red fluid drained from incision  Genitourinary: Positive for vaginal bleeding. Negative for difficulty urinating and pelvic pain (incision is numb).  Musculoskeletal: Negative for back pain.   Other systems negative     Physical Exam   Patient Vitals for the past 24 hrs:  BP Temp Temp src Pulse Resp SpO2 Height Weight  12/30/20 0322 128/85 - - 99 - 99 % - -  12/30/20 0308 - - - - - - 5' 2"  (1.575 m) 110 kg  12/30/20 0307 - 98.5 F (36.9 C) Oral 99 19 100 % - -   Constitutional: Well-developed, well-nourished female in no acute distress.  Cardiovascular: normal rate and rhythm, no ectopy audible, S1 & S2 heard, no murmur Respiratory: normal effort, no distress. Lungs CTAB with no wheezes or crackles  GI: Abd soft, non-tender.  Nondistended.  No rebound, No guarding.  Bowel Sounds audible       Incision is well approximated.  No areas of dehiscence noted.  Unable to express any drainage from incision.   MS: Extremities nontender, no edema, normal ROM Neurologic: Alert and oriented x 4.   Grossly  nonfocal. GU: Neg CVAT. Skin:  Warm and Dry Psych:  Affect appropriate.  PELVIC EXAM: deferred   Labs: No results found for this or any previous visit (from the past 24 hour(s)). --/--/B POS (01/19 1250)  Imaging:    MAU Course/MDM: I have ordered labs as follows: none Imaging ordered: none   Consult Dr Roselie Awkward who does not recommend any imaging.   Treatments in MAU included  Wound evaluation.   Pt stable at time of discharge.  Assessment: Postop Day #6 Wound drainage, likely Seroma No evidence of dehiscence  Plan: Discharge home Recommend observe for further drainage, redness or unsual findings Has appt later today for wound check Encouraged to return here  or to other Urgent Care/ED if she develops worsening of symptoms, increase in pain, fever, or other concerning symptoms.   Hansel Feinstein CNM, MSN Certified Nurse-Midwife 12/30/2020 3:22 AM

## 2020-12-30 NOTE — MAU Note (Signed)
...  Nichole Bush is a 25 y.o. at 7 days post partum c/s here in MAU reporting: "I woke up at 0132 with blood on my pants and my c-section scar was bleeding." Patient reports she is not experiencing any pain and states that it is numb and feels pressure.  Patient reports scant light pink vaginal bleeding.

## 2020-12-30 NOTE — Discharge Instructions (Signed)
Seroma A seroma is a collection of fluid on the body that looks like swelling or a mass. Seromas form where tissue has been injured or cut. Seromas vary in size. Some are small and painless. Others may become large and cause pain or discomfort. Many seromas go away on their own as the fluid is naturally absorbed by the body, and some seromas need to be drained. What are the causes? Seromas form as the result of damage to tissue or the removal of tissue. This tissue damage may happen during surgery or because of an injury or trauma. When tissue is disrupted or removed, empty space is created. The body's natural defense system (immune system) causes fluid to enter the empty space and form a seroma. What are the signs or symptoms? Symptoms of this condition include:  Swelling at the site of a surgical incision or an injury.  Drainage of clear fluid at the surgery or injury site.  Discomfort or pain. How is this diagnosed? This condition is diagnosed based on:  Your symptoms.  Your medical history.  A physical exam. During the exam, your health care provider will press on the seroma. You may also have tests, including:  Blood tests.  Imaging tests, such as an ultrasound or a CT scan. How is this treated? Some seromas go away on their own. Your health care provider may monitor you to make sure the seroma does not cause any problems. If your seroma does not go away on its own, treatment may include:  Using a needle to drain the fluid from the seroma (needle aspiration).  Inserting a small, thin tube (catheter) to drain the fluid.  Applying a bandage (dressing), such as an elastic bandage or binder.  Taking antibiotic medicines, if the seroma becomes infected. In rare cases, surgery may be done to remove the seroma and repair the area. Follow these instructions at home:  If you were prescribed an antibiotic medicine, take it as told by your health care provider. Do not stop using the  antibiotic even if you start to feel better.  Take over-the-counter and prescription medicines only as told by your health care provider.  Return to your normal activities as told by your health care provider. Ask your health care provider what activities are safe for you.  Check your seroma every day for signs of infection. Check for: ? Redness or pain. ? More swelling. ? More fluid. ? Warmth. ? Pus or a bad smell.  Keep all follow-up visits as told by your health care provider. This is important.   Contact a health care provider if:  You have a fever.  You have redness or pain at the site of the seroma.  Your seroma is more swollen or is getting bigger.  You have more fluid coming from the seroma.  Your seroma feels warm to the touch.  You have pus or a bad smell coming from the seroma. Get help right away if:  You have a fever along with severe pain, redness at the site, or chills.  You feel confused or have trouble staying awake.  You feel like your heart is beating very fast.  You feel short of breath or are breathing rapidly.  You have cool, clammy, or sweaty skin. Summary  Seromas can form as a result of injury or surgery on tissue.  Seromas can cause swelling, drainage of clear fluid, and discomfort or pain at the site of the tissue injury.  Some seromas go away on   their own. Other seromas may need to be drained.  Check your seroma every day for signs of infection, such as pain, more swelling, more fluid, warmth, or pus or a bad smell. This information is not intended to replace advice given to you by your health care provider. Make sure you discuss any questions you have with your health care provider. Document Revised: 10/13/2019 Document Reviewed: 10/13/2019 Elsevier Patient Education  2021 Elsevier Inc.  

## 2021-01-03 ENCOUNTER — Other Ambulatory Visit: Payer: Medicaid Other

## 2021-01-03 ENCOUNTER — Ambulatory Visit: Payer: Medicaid Other

## 2021-01-06 ENCOUNTER — Other Ambulatory Visit: Payer: Self-pay

## 2021-01-06 ENCOUNTER — Ambulatory Visit: Payer: Medicaid Other

## 2021-01-06 ENCOUNTER — Encounter: Payer: Self-pay | Admitting: Family Medicine

## 2021-01-06 ENCOUNTER — Ambulatory Visit (INDEPENDENT_AMBULATORY_CARE_PROVIDER_SITE_OTHER): Payer: Medicaid Other | Admitting: Family Medicine

## 2021-01-06 VITALS — BP 108/81 | HR 107 | Wt 226.0 lb

## 2021-01-06 DIAGNOSIS — Z4889 Encounter for other specified surgical aftercare: Secondary | ICD-10-CM

## 2021-01-06 NOTE — Progress Notes (Signed)
Patient presents for BP check and incision check after 29.6 c-section delivery due to severe Pre Eclampsia. Armandina Stammer RN

## 2021-01-06 NOTE — Progress Notes (Signed)
   Subjective:    Patient ID: Nichole Bush, female    DOB: 02-25-1996, 25 y.o.   MRN: [id number]  HPI  Patient seen for incision check. She had a repeat c/s with BTL 2 weeks ago due to preeclampsia. Reports some pain in Left corner. No other concerns.  Review of Systems     Objective:   Physical Exam Vitals and nursing note reviewed.  Constitutional:      Appearance: Normal appearance.  Abdominal:     Comments: Incision clean, dry, intact. No drainage or erythema. Tender in the left corner  Neurological:     Mental Status: She is alert.        Assessment & Plan:  1. Encounter for post surgical wound check Doing well. No concerns. Return precautions given.

## 2021-01-20 ENCOUNTER — Encounter: Payer: Self-pay | Admitting: Family Medicine

## 2021-01-20 ENCOUNTER — Other Ambulatory Visit: Payer: Self-pay

## 2021-01-20 ENCOUNTER — Ambulatory Visit (INDEPENDENT_AMBULATORY_CARE_PROVIDER_SITE_OTHER): Payer: Medicaid Other | Admitting: Family Medicine

## 2021-01-20 DIAGNOSIS — Z98891 History of uterine scar from previous surgery: Secondary | ICD-10-CM

## 2021-01-20 DIAGNOSIS — O1493 Unspecified pre-eclampsia, third trimester: Secondary | ICD-10-CM

## 2021-01-20 MED ORDER — TRAZODONE HCL 50 MG PO TABS: 50.0000 mg | ORAL_TABLET | Freq: Every day | ORAL | 0 refills | Status: AC

## 2021-01-20 MED ORDER — PRAMOXINE-HC 1-1 % EX CREA: TOPICAL_CREAM | Freq: Three times a day (TID) | CUTANEOUS | 2 refills | Status: AC

## 2021-01-20 NOTE — Progress Notes (Signed)
    Post Partum Visit Note  Nichole Bush is a 25 y.o. [phone] female who presents for a postpartum visit. She is 4 weeks postpartum following a repeat cesarean section.  I have fully reviewed the prenatal and intrapartum course. The delivery was at 29 gestational weeks.  Anesthesia: spinal. Postpartum course has been unremarkable. Baby is doing well. Baby is feeding by Pumping. Bleeding staining only. Bowel function is normal. Bladder function is normal. Patient is not sexually active. Contraception method is BTL. Postpartum depression screening: negative. EPDS = 5     The following portions of the patient's history were reviewed and updated as appropriate: allergies, current medications, past family history, past medical history, past social history, past surgical history and problem list.  Review of Systems Pertinent items are noted in HPI.    Objective:  BP 109/80   Wt 226 lb (102.5 kg)   LMP 05/28/2020 (Approximate)   BMI 41.34 kg/m    General:  alert, cooperative and no distress  Lungs: clear to auscultation bilaterally  Heart:  regular rate and rhythm, S1, S2 normal, no murmur, click, rub or gallop  Abdomen: soft, non-tender; bowel sounds normal; no masses,  no organomegaly. Incision clean, dry, intact        Assessment:    Normal postpartum exam. Pap smear not done at today's visit.   Plan:   Essential components of care per ACOG recommendations:  1.  Mood and well being: Patient with negative depression screening today. Reviewed local resources for support.  - Patient does not use tobacco.  - hx of drug use? No   2. Infant care and feeding:  -Patient currently breastmilk feeding? Yes  -Social determinants of health (SDOH) reviewed in EPIC. No concerns  3. Sexuality, contraception and birth spacing - Patient does not want a pregnancy in the next year.  Desired family size is 2 children.  - Reviewed forms of contraception in tiered fashion. Patient desired bilateral  tubal ligation today.   - Discussed birth spacing of 18 months  4. Sleep and fatigue -Encouraged family/partner/community support of 4 hrs of uninterrupted sleep to help with mood and fatigue  5. Physical Recovery  - Discussed patients delivery and complications - Patient has urinary incontinence? No - Patient is safe to resume physical and sexual activity  6.  Health Maintenance - Last pap smear done 05/19/2019 and was normal with negative HPV.   Levie Heritage, DO [facility] [employer], [facility]

## 2021-02-07 ENCOUNTER — Ambulatory Visit: Payer: Medicaid Other

## 2021-02-07 ENCOUNTER — Ambulatory Visit: Payer: Medicaid Other | Admitting: Obstetrics & Gynecology

## 2021-02-08 ENCOUNTER — Other Ambulatory Visit: Payer: Self-pay

## 2021-02-08 ENCOUNTER — Ambulatory Visit (INDEPENDENT_AMBULATORY_CARE_PROVIDER_SITE_OTHER): Payer: Medicaid Other

## 2021-02-08 ENCOUNTER — Ambulatory Visit: Payer: Self-pay

## 2021-02-08 VITALS — BP 112/65 | HR 90 | Wt 224.0 lb

## 2021-02-08 DIAGNOSIS — Z0130 Encounter for examination of blood pressure without abnormal findings: Secondary | ICD-10-CM

## 2021-02-08 NOTE — Lactation Note (Signed)
This note was copied from a baby's chart. Lactation Consultation Note  Patient Name: Girl Shanley Furlough IOEVO'J Date: 02/08/2021 Reason for consult: NICU baby;Preterm <34wks Age:24 wk.o.  Lengthy consult with Mom. Mom admits that postpartum anxiety has been an issue for her (she has been followed inpatient by Lawanna Kobus, [facility]). Mom offered that she is unable to pump 8 times/day, but is currently pumping 5 times/day and gets 4-7 oz/session. Mom will be returning to work soon (her shift will be 5a-3p) at [employer].  We had a conversation about Mom's goals in regards to expressing milk. She is concerned that she won't be able to pump as often when she returns to work. I educated her about the Federal law that protects pumping during the 1st year of life for hourly wage workers. 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   We talked further about Mom's pumping; she is going 9-10 hrs per night without expressing her milk and I shared with her my concerns about that decreasing her milk supply over time. I made it clear that I do not want her to wake up to pump. Since Mom only sleeps a 6-hr stretch, we discussed adding one more pumping session right before she falls asleep to protect her milk supply.    Mom inquired about hands-free pumping. I showed her a pertinent article on the [facility] website on how to create your own, but I also fixed a belly binder for her to use, which pleased Mom. Mom has been pumping with size 27 flanges, but those seem too large based on her nipple diameter. I observed her pump with size 24 flanges for a few minutes with good results.   Infant currently has oral thrush with a corresponding diaper rash.   Lurline Hare [facility] 02/08/2021, 12:38 PM

## 2021-02-08 NOTE — Progress Notes (Addendum)
Subjective:  Nichole Bush is a 25 y.o. female here for BP check.   Hypertension ROS: taking medications as instructed, no medication side effects noted, no TIA's, no chest pain on exertion, no dyspnea on exertion and no swelling of ankles.    Objective:  BP 112/65   Pulse 90   Wt 224 lb (101.6 kg)   LMP 02/07/2021   BMI 40.97 kg/m   Appearance alert, well appearing, and in no distress. General exam BP noted to be well controlled today in office.    Assessment:   Blood Pressure well controlled.   Plan:  Current treatment plan is effective, no change in therapy.  Fay Swider l Reveca Desmarais, CMA   Patient was assessed and managed by nursing staff during this encounter. I have reviewed the chart and agree with the documentation and plan. I have also made any necessary editorial changes.  Wynelle Bourgeois, CNM 02/08/2021 10:09 PM

## 2021-02-08 NOTE — Lactation Note (Signed)
This note was copied from a baby's chart. Lactation Consultation Note  Patient Name: Nichole Bush PPIRJ'J Date: 02/08/2021 Reason for consult: NICU baby;Preterm <34wks Age:24 wk.o.  I received a phone call from A. Paschal, RN. Mom takes liquid chlorophyll for her skin and was wondering if that was compatible with breastfeeding. I could not find anything in my resources; I called Allie, NICU pharmacist. She will look further and contact RN with an answer. RN is aware.    Lurline Hare [facility] 02/08/2021, 3:41 PM

## 2021-02-10 IMAGING — US US MFM UA CORD DOPPLER
1 series · 15 of 21 positions shown · non-contrast
Comparison: none

[Series 1: us mfm ua cord doppler · 21 acquisitions, 15 frames shown]
[im 1/21]
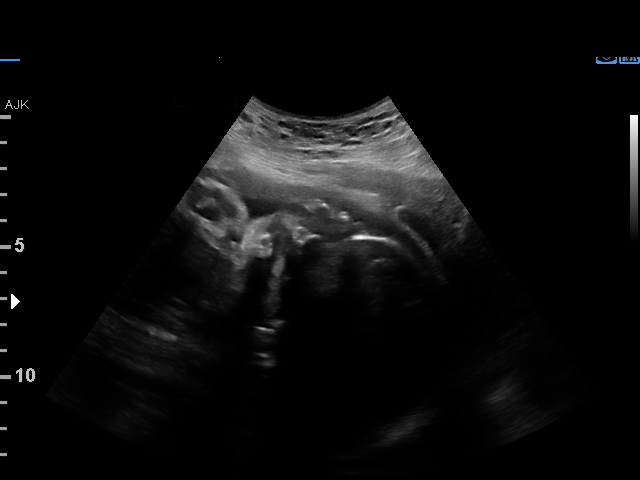
[im 3/21]
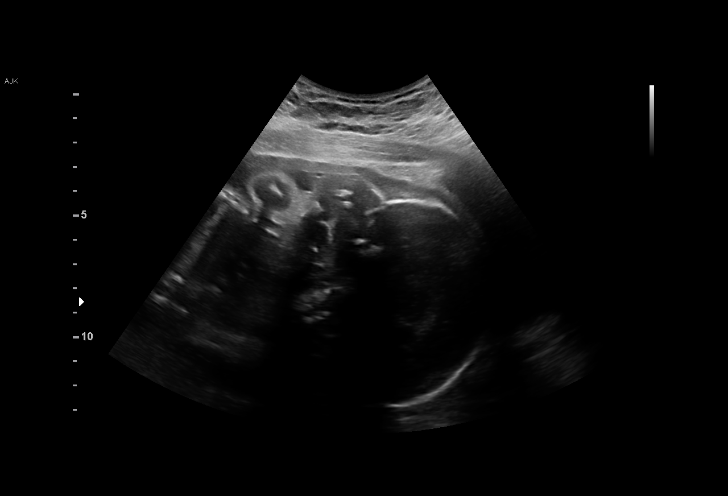
[im 4/21]
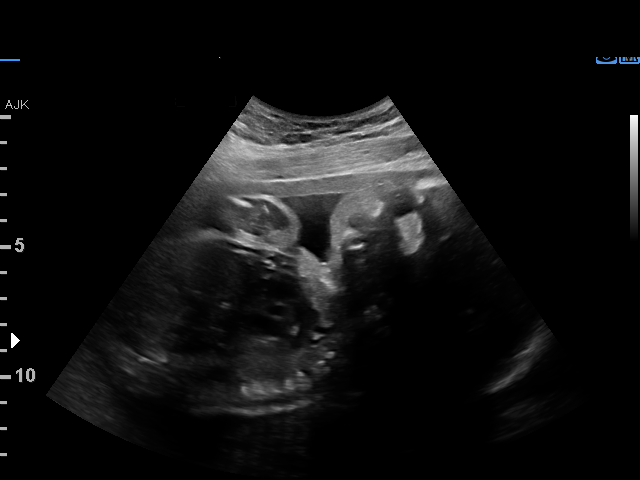
[im 5/21]
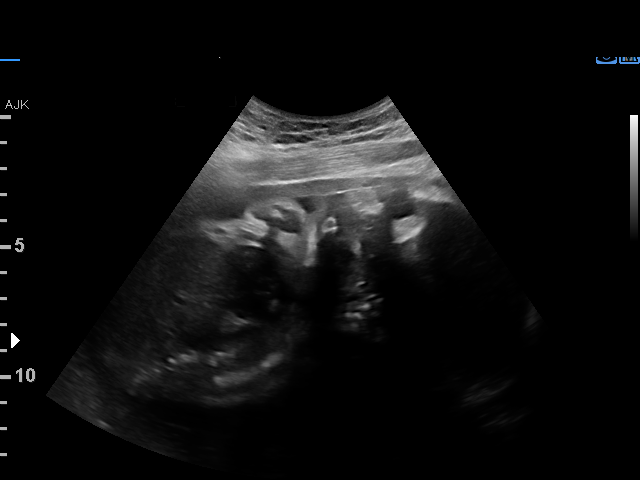
[im 7/21]
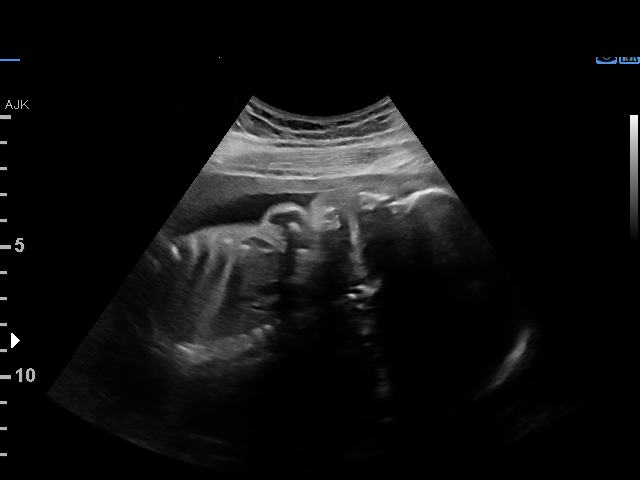
[im 8/21]
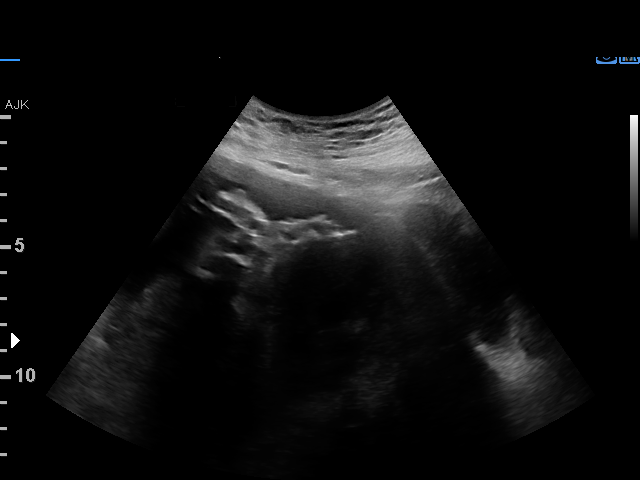
[im 10/21]
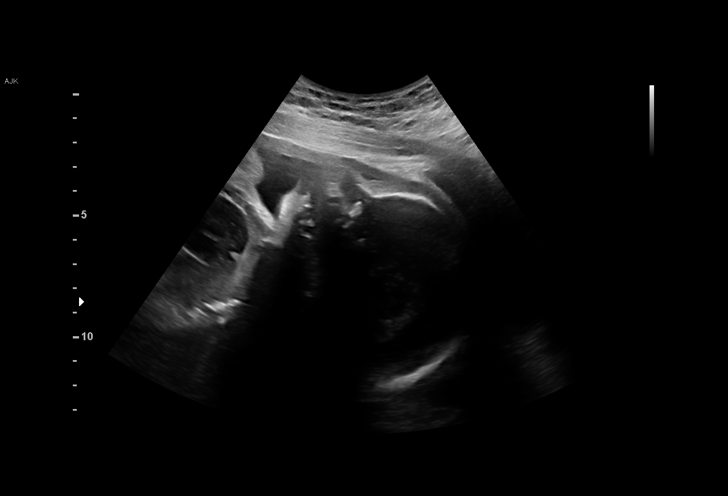
[im 11/21]
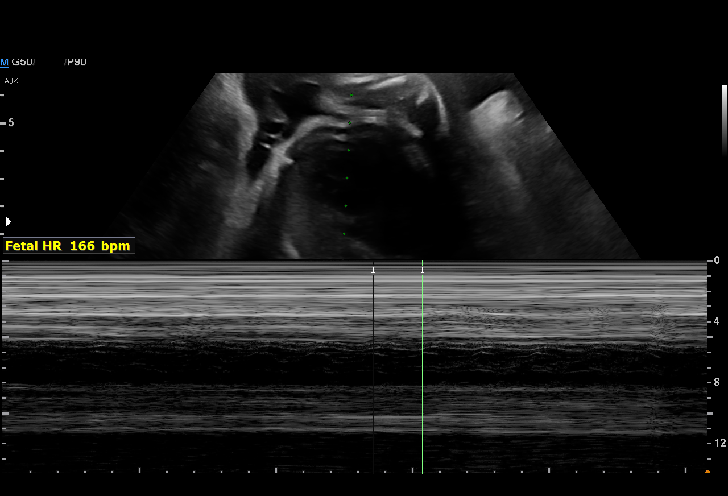
[im 12/21]
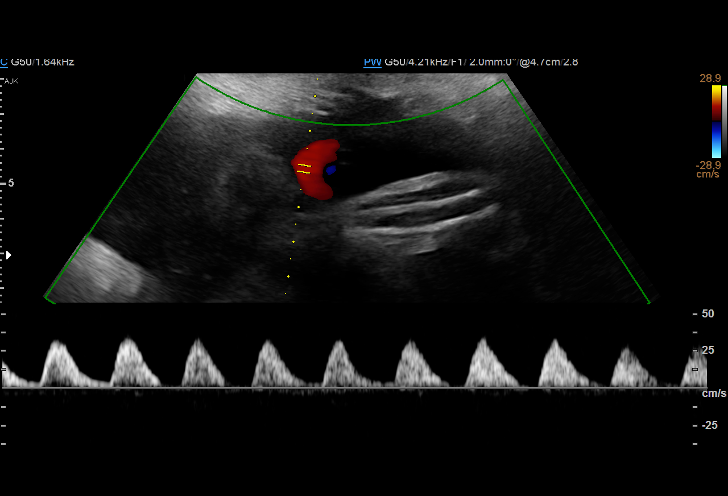
[im 14/21]
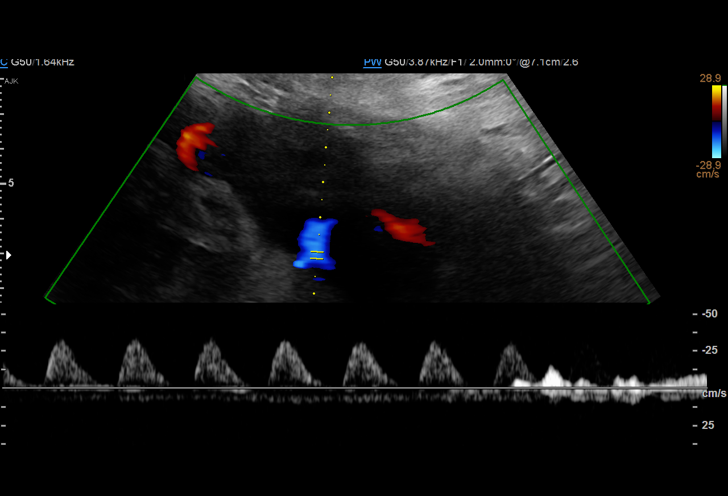
[im 15/21]
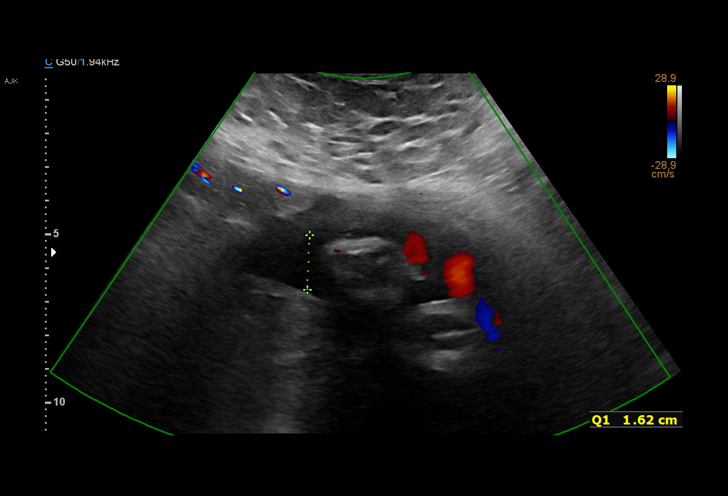
[im 17/21]
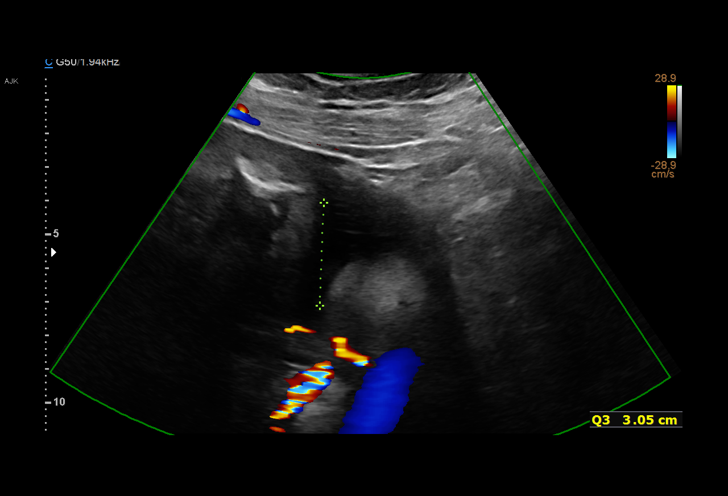
[im 18/21]
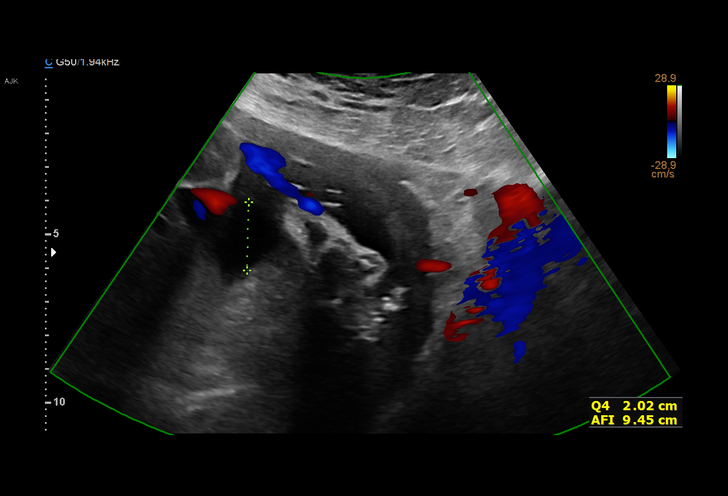
[im 19/21]
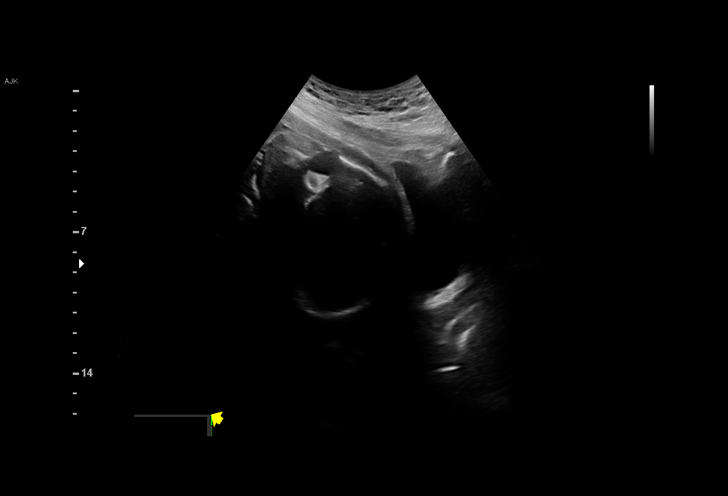
[im 21/21]
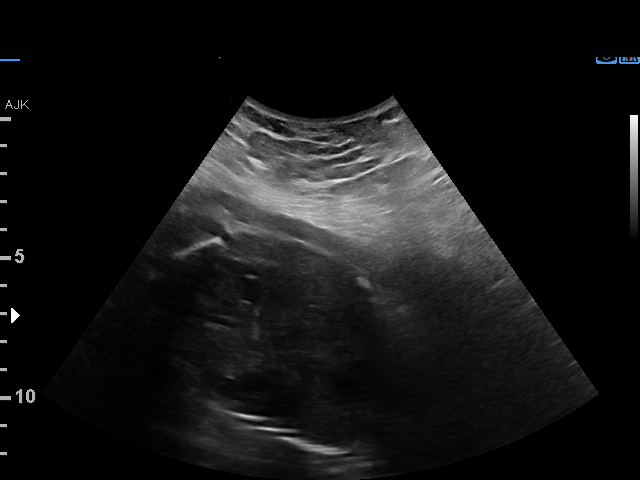

[15 of 21 positions shown; findings below may reference images not displayed]

2  US MFM UA CORD DOPPLER                76820.02    JAVINNE ZEUS LEE

Indications

 Maternal care for known or suspected poor
 fetal growth, third trimester, fetus 1 IUGR
 29 weeks gestation of pregnancy
 Thyroid disease in pregnancy                    O99.280,
 (hypothyroidism)
 History of cesarean delivery, currently
 pregnant
 Poor obstetric history: Previous
 preeclampsia / eclampsia/gestational HTN
 Asthma                                          8XX.GX j21.131
 Poor obstetric history: Previous gestational
 diabetes
 LR NIPS/ - Neg AFP
 Other umbilical cord
 complications,antepartum condition or
 complication
Fetal Evaluation

 Num Of Fetuses:          1
 Fetal Heart Rate(bpm):   166
 Cardiac Activity:        Observed
 Presentation:            Cephalic

 Amniotic Fluid
 AFI FV:      Within normal limits

 AFI Sum(cm)     %Tile       Largest Pocket(cm)
 9.5             9

 RUQ(cm)       RLQ(cm)       LUQ(cm)        LLQ(cm)
 1.6           2
Biophysical Evaluation

 Amniotic F.V:   Within normal limits       F. Tone:         Observed
 F. Movement:    Observed                   Score:           [DATE]
 F. Breathing:   Observed
OB History

 Blood Type:   B+
 Gravidity:    2         Term:   0        Prem:   1        SAB:   0
 TOP:          0       Ectopic:  0        Living: 1
Gestational Age

 LMP:           29w 5d        Date:  05/28/20                 EDD:   03/04/21
 Best:          29w 5d     Det. By:  LMP  (05/28/20)          EDD:   03/04/21
Doppler - Fetal Vessels

 Umbilical Artery
                                                             ADFV    RDFV
                                                               Yes      No

Comments

 Jaun Khayelihle is currently at 29 weeks and 5 days. She has
 a known IUGR fetus with absent end-diastolic flow. She was
 admitted this afternoon due to severe preeclampsia. Her
 blood pressures were in the 150s over 100s range and she
 was complaining of a headache. She also has a 20 pound
 weight gain over the past few days. Her PIH labs are within
 normal limits.

 Doppler studies of the umbilical arteries performed today
 continues to show absent end-diastolic flow.

 A biophysical profile performed today was [DATE].

 Low normal amniotic fluid with a total AFI of 9.5 cm was
 noted.

 As the patient is now complaining of headaches along with
 severe range blood pressures, I would recommend delivery
 once she receives a rescue course of steroids.

 To expedite delivery, the rescue steroid course may be given
 12 hours apart.

 She should be continued on magnesium sulfate for maternal
 seizure prophylaxis and fetal neuroprotection.

## 2021-02-14 ENCOUNTER — Ambulatory Visit: Payer: Self-pay

## 2021-02-14 ENCOUNTER — Ambulatory Visit (INDEPENDENT_AMBULATORY_CARE_PROVIDER_SITE_OTHER): Payer: Medicaid Other | Admitting: Licensed Clinical Social Worker

## 2021-02-14 DIAGNOSIS — O99345 Other mental disorders complicating the puerperium: Secondary | ICD-10-CM | POA: Diagnosis not present

## 2021-02-14 DIAGNOSIS — F418 Other specified anxiety disorders: Secondary | ICD-10-CM | POA: Diagnosis not present

## 2021-02-14 NOTE — Lactation Note (Signed)
This note was copied from a baby's chart. Lactation Consultation Note  Patient Name: Nichole Bush BSWHQ'P Date: 02/14/2021 Reason for consult: Follow-up assessment;Mother's request;NICU baby;Preterm <34wks Age:25 24.o.  [name] Camel paged for lactation. I visited her, and she states that she is returning to work this week. Her 8 hour shift is from 4 pm to 1 pm. She states that she has access to a pumping room at work.   [name] had questions about the potential use of formula in the event that she could not produce enough breast milk. Her plan is to pump and bottle feed exclusively. [name] wanted to know if formula would affect her daughter. I discussed the risks of ABM and benefits of breast milk, and we discussed that formula is a viable option in the event that there is no access to breast milk. [name] related that her son had difficulty with some brands of formula and has lactose intolerance. She wanted to know if baby should have a special brand or kind of formula. I advised her to speak with her provider as I could not recommend a formula brand or type.  We did discuss her pumping routine. She has increased her sessions to up to 7 a day and has seen positive results. She states that she pumps between 4-10 ounces a session (both breasts). 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 I encouraged her to call us if she had any questions about pumping during her transition back to work. We briefly talked about a few pumping strategies to help maintain her milk volume during this time.   Feeding Mother's Current Feeding Choice: Breast Milk   Interventions Interventions: Education   Consult Status Consult Status: Follow-up Date: 02/21/21 Follow-up type: In-patient    Walker Shadow 02/14/2021, 9:39 AM

## 2021-02-14 NOTE — BH Specialist Note (Signed)
Integrated Behavioral Health via Telemedicine Visit  02/14/2021 Nichole Bush [id number]  Number of Integrated Behavioral Health visits: Session Start time: 9:34am  Session End time: 10:21pm Total time: 45 mins via mychart video   Referring Provider: Hospital Social Worker  Patient/Family location: NICU [facility] Provider location: [facility]  All persons participating in visit: Pt D. Heinrichs and LCSWA A. Figueroa  Types of Service: Individual psychotherapy and Video visit  I connected with [name] and/or Sheryle Hail Wandell's  via  Telephone or [employer]  (Video is Surveyor, mining) and verified that I am speaking with the correct person using two identifiers. Discussed confidentiality: Yes   I discussed the limitations of telemedicine and the availability of in person appointments.  Discussed there is a possibility of technology failure and discussed alternative modes of communication if that failure occurs.  I discussed that engaging in this telemedicine visit, they consent to the provision of behavioral healthcare and the services will be billed under their insurance.  Patient and/or legal guardian expressed understanding and consented to Telemedicine visit: Yes   Presenting Concerns: Patient and/or family reports the following symptoms/concerns: anxiety Duration of problem: approx 3 months ; Severity of problem: moderate  Patient and/or Family's Strengths/Protective Factors: Parental Resilience  Goals Addressed: Patient will: 1.  Reduce symptoms of: anxiety  2.  Increase knowledge and/or ability of: coping skills  3.  Demonstrate ability to: Increase healthy adjustment to current life circumstances  Progress towards Goals: Ongoing  Interventions: Interventions utilized:  Supportive Counseling Standardized Assessments completed: PHQ 9  Assessment: Patient currently experiencing anxiety   Patient may benefit from integrated behavioral  health   Plan: 1. Follow up with behavioral health clinician on : 2 weeks via mychart  2. Behavioral recommendations: Engage in self care, journal emotions and practice deep breathing exercise  3. Referral(s): Integrated [employer] (In Clinic)  I discussed the assessment and treatment plan with the patient and/or parent/guardian. They were provided an opportunity to ask questions and all were answered. They agreed with the plan and demonstrated an understanding of the instructions.   They were advised to call back or seek an in-person evaluation if the symptoms worsen or if the condition fails to improve as anticipated.  Gwyndolyn Saxon, LCSW

## 2021-02-22 ENCOUNTER — Other Ambulatory Visit: Payer: Self-pay | Admitting: Family Medicine

## 2021-02-22 MED ORDER — NORGESTIMATE-ETH ESTRADIOL 0.25-35 MG-MCG PO TABS: 1.0000 | ORAL_TABLET | Freq: Every day | ORAL | 3 refills | Status: AC

## 2021-02-25 ENCOUNTER — Inpatient Hospital Stay (HOSPITAL_COMMUNITY): Admit: 2021-02-25 | Payer: Medicaid Other | Admitting: Obstetrics & Gynecology

## 2021-02-25 ENCOUNTER — Encounter (HOSPITAL_COMMUNITY): Payer: Self-pay

## 2021-02-25 SURGERY — Surgical Case
Anesthesia: Regional

## 2021-03-03 ENCOUNTER — Ambulatory Visit: Payer: Self-pay

## 2021-03-03 ENCOUNTER — Ambulatory Visit: Payer: Medicaid Other | Admitting: Family Medicine

## 2021-03-03 NOTE — Lactation Note (Signed)
This note was copied from a baby's chart. Lactation Consultation Note  Patient Name: Girl Dezyrae Kensinger [id number]'P Date: 03/03/2021   Age:24 m.o.  Maternal Data Mom is no longer pumping and providing her milk. Lactation services are complete.   Consult Status: Complete Follow-up type: In-patient   Elder Negus, MA IBCLC 03/03/2021, 5:05 PM

## 2021-03-17 ENCOUNTER — Telehealth: Payer: Self-pay | Admitting: General Practice

## 2021-03-17 NOTE — Telephone Encounter (Signed)
Patient left message with after hours call center requesting appointment with a counselor.  Called patient to schedule, but no answer.  Left message on VM asking pt to contact our office to schedule appt.

## 2021-03-21 ENCOUNTER — Telehealth: Payer: Self-pay | Admitting: Licensed Clinical Social Worker

## 2021-03-21 ENCOUNTER — Encounter: Payer: Medicaid Other | Admitting: Licensed Clinical Social Worker

## 2021-03-21 ENCOUNTER — Ambulatory Visit (INDEPENDENT_AMBULATORY_CARE_PROVIDER_SITE_OTHER): Payer: Medicaid Other | Admitting: Licensed Clinical Social Worker

## 2021-03-21 DIAGNOSIS — F4323 Adjustment disorder with mixed anxiety and depressed mood: Secondary | ICD-10-CM | POA: Diagnosis not present

## 2021-03-21 NOTE — Telephone Encounter (Signed)
Called Nichole Bush to start mychart visit. Ms. Verdell requested to visit at 130pm today due to being up with newborn and just fell asleep at 7am

## 2021-03-23 NOTE — BH Specialist Note (Signed)
Integrated Behavioral Health via Telemedicine Visit  03/23/2021 Nichole Bush [id number]  Number of Integrated Behavioral Health visits: 2/6 Session Start time: 1:30pm  Session End time: 2:05pm Total time: 35 mins via mychart video   Referring Provider: Hospital social worker  Patient/Family location: Home  [facility] Provider location: [facility]  All persons participating in visit: Pt Nichole Bush and Nichole Bush  Types of Service: Individual psychotherapy and Video visit  I connected with Nichole Bush and/or Nichole Bush's n/a via  Telephone or [employer]  (Video is Caregility application) and verified that I am speaking with the correct person using two identifiers. Discussed confidentiality: Yes   I discussed the limitations of telemedicine and the availability of in person appointments.  Discussed there is a possibility of technology failure and discussed alternative modes of communication if that failure occurs.  I discussed that engaging in this telemedicine visit, they consent to the provision of behavioral healthcare and the services will be billed under their insurance.  Patient and/or legal guardian expressed understanding and consented to Telemedicine visit: Yes   Presenting Concerns: Patient and/or family reports the following symptoms/concerns: guilt, depressed mood, stress  Duration of problem: approx 4 months  ; Severity of problem: mild  Patient and/or Family's Strengths/Protective Factors: Social connections  Goals Addressed: Patient will: 1.  Reduce symptoms of: depression  2.  Increase knowledge and/or ability of: coping skills and stress reduction  3.  Demonstrate ability to: Increase healthy adjustment to current Bush circumstances and Increase adequate support systems for patient/family  Progress towards Goals: Ongoing  Interventions: Interventions utilized:  Motivational Interviewing and Supportive Counseling Standardized  Assessments completed: [place] Postnatal Depression   Assessment: Patient currently experiencing adjustment disorder with mixed mood   Patient may benefit from integrated behavioral health   Plan: 1. Follow up with behavioral health clinician on : 3 weeks via mychart  2. Behavioral recommendations: Communicate needs with system support to prevent burnout, continue with journal writing and begin positive affirmation to boost mood  3. Referral(s): Integrated [employer] (In Clinic)  I discussed the assessment and treatment plan with the patient and/or parent/guardian. They were provided an opportunity to ask questions and all were answered. They agreed with the plan and demonstrated an understanding of the instructions.   They were advised to call back or seek an in-person evaluation if the symptoms worsen or if the condition fails to improve as anticipated.  Gwyndolyn Saxon, LCSW

## 2021-04-19 ENCOUNTER — Telehealth: Payer: Self-pay | Admitting: General Practice

## 2021-04-19 NOTE — Telephone Encounter (Signed)
Patient called stating that she is changing pads every 10 minutes and feeling light headed.  Advised patient to go to the ED as soon as possible and to have someone drive her.  Pt verbalized understanding.

## 2021-05-05 ENCOUNTER — Ambulatory Visit: Payer: Medicaid Other | Admitting: Family Medicine
